# Patient Record
Sex: Male | Born: 1945 | Race: White | Hispanic: No | Smoking: Former smoker
Health system: Southern US, Community
[De-identification: ages and names within clinical notes are randomized; demographics above are authoritative.]

## PROBLEM LIST (undated history)

## (undated) DIAGNOSIS — M545 Low back pain, unspecified: Secondary | ICD-10-CM

## (undated) DIAGNOSIS — E663 Overweight: Secondary | ICD-10-CM

## (undated) DIAGNOSIS — R011 Cardiac murmur, unspecified: Secondary | ICD-10-CM

## (undated) DIAGNOSIS — J209 Acute bronchitis, unspecified: Secondary | ICD-10-CM

## (undated) DIAGNOSIS — I350 Nonrheumatic aortic (valve) stenosis: Secondary | ICD-10-CM

## (undated) DIAGNOSIS — R7989 Other specified abnormal findings of blood chemistry: Secondary | ICD-10-CM

## (undated) DIAGNOSIS — I6529 Occlusion and stenosis of unspecified carotid artery: Secondary | ICD-10-CM

## (undated) DIAGNOSIS — I10 Essential (primary) hypertension: Secondary | ICD-10-CM

## (undated) DIAGNOSIS — Z87440 Personal history of urinary (tract) infections: Secondary | ICD-10-CM

## (undated) DIAGNOSIS — R918 Other nonspecific abnormal finding of lung field: Secondary | ICD-10-CM

## (undated) DIAGNOSIS — H269 Unspecified cataract: Secondary | ICD-10-CM

## (undated) DIAGNOSIS — Z952 Presence of prosthetic heart valve: Secondary | ICD-10-CM

## (undated) DIAGNOSIS — R945 Abnormal results of liver function studies: Secondary | ICD-10-CM

## (undated) DIAGNOSIS — K219 Gastro-esophageal reflux disease without esophagitis: Secondary | ICD-10-CM

## (undated) DIAGNOSIS — I2510 Atherosclerotic heart disease of native coronary artery without angina pectoris: Secondary | ICD-10-CM

## (undated) DIAGNOSIS — E7800 Pure hypercholesterolemia, unspecified: Secondary | ICD-10-CM

## (undated) DIAGNOSIS — R0600 Dyspnea, unspecified: Secondary | ICD-10-CM

## (undated) DIAGNOSIS — M1990 Unspecified osteoarthritis, unspecified site: Secondary | ICD-10-CM

## (undated) HISTORY — DX: Occlusion and stenosis of unspecified carotid artery: I65.29

## (undated) HISTORY — DX: Personal history of urinary (tract) infections: Z87.440

## (undated) HISTORY — DX: Unspecified osteoarthritis, unspecified site: M19.90

## (undated) HISTORY — DX: Overweight: E66.3

## (undated) HISTORY — DX: Gastro-esophageal reflux disease without esophagitis: K21.9

## (undated) HISTORY — DX: Essential (primary) hypertension: I10

## (undated) HISTORY — DX: Low back pain: M54.5

## (undated) HISTORY — DX: Pure hypercholesterolemia, unspecified: E78.00

## (undated) HISTORY — DX: Abnormal results of liver function studies: R94.5

## (undated) HISTORY — DX: Acute bronchitis, unspecified: J20.9

## (undated) HISTORY — DX: Low back pain, unspecified: M54.50

## (undated) HISTORY — DX: Other specified abnormal findings of blood chemistry: R79.89

## (undated) HISTORY — PX: COLONOSCOPY: SHX174

## (undated) HISTORY — DX: Unspecified cataract: H26.9

## (undated) HISTORY — PX: APPENDECTOMY: SHX54

## (undated) HISTORY — DX: Atherosclerotic heart disease of native coronary artery without angina pectoris: I25.10

---

## 2004-02-21 ENCOUNTER — Ambulatory Visit: Payer: Self-pay | Admitting: Pulmonary Disease

## 2004-04-08 ENCOUNTER — Ambulatory Visit: Payer: Self-pay | Admitting: Pulmonary Disease

## 2004-11-20 ENCOUNTER — Ambulatory Visit: Payer: Self-pay | Admitting: Pulmonary Disease

## 2004-12-04 ENCOUNTER — Ambulatory Visit: Payer: Self-pay | Admitting: Pulmonary Disease

## 2005-07-06 ENCOUNTER — Ambulatory Visit: Payer: Self-pay | Admitting: Pulmonary Disease

## 2005-07-06 ENCOUNTER — Inpatient Hospital Stay (HOSPITAL_COMMUNITY): Admission: EM | Admit: 2005-07-06 | Discharge: 2005-07-09 | Payer: Self-pay | Admitting: Emergency Medicine

## 2005-07-22 ENCOUNTER — Ambulatory Visit: Payer: Self-pay | Admitting: Pulmonary Disease

## 2005-08-21 ENCOUNTER — Ambulatory Visit: Payer: Self-pay | Admitting: Pulmonary Disease

## 2007-11-26 ENCOUNTER — Ambulatory Visit: Payer: Self-pay | Admitting: Pulmonary Disease

## 2007-11-26 DIAGNOSIS — Z8639 Personal history of other endocrine, nutritional and metabolic disease: Secondary | ICD-10-CM

## 2007-11-26 DIAGNOSIS — E785 Hyperlipidemia, unspecified: Secondary | ICD-10-CM

## 2007-11-26 DIAGNOSIS — K219 Gastro-esophageal reflux disease without esophagitis: Secondary | ICD-10-CM | POA: Insufficient documentation

## 2007-11-26 DIAGNOSIS — Z862 Personal history of diseases of the blood and blood-forming organs and certain disorders involving the immune mechanism: Secondary | ICD-10-CM

## 2007-11-26 DIAGNOSIS — M1990 Unspecified osteoarthritis, unspecified site: Secondary | ICD-10-CM | POA: Insufficient documentation

## 2007-12-03 ENCOUNTER — Ambulatory Visit: Payer: Self-pay | Admitting: Pulmonary Disease

## 2007-12-03 LAB — CONVERTED CEMR LAB
Basophils Absolute: 0.1 10*3/uL (ref 0.0–0.1)
Basophils Relative: 1.4 % (ref 0.0–3.0)
CO2: 30 meq/L (ref 19–32)
Cholesterol: 161 mg/dL (ref 0–200)
Eosinophils Relative: 3.3 % (ref 0.0–5.0)
GFR calc Af Amer: 97 mL/min
GFR calc non Af Amer: 80 mL/min
HCT: 41.7 % (ref 39.0–52.0)
HDL: 39.7 mg/dL (ref >39.0)
Lymphocytes Relative: 29.2 % (ref 12.0–46.0)
MCV: 87.1 fL (ref 78.0–100.0)
Monocytes Relative: 6.9 % (ref 3.0–12.0)
PSA: 1.37 ng/mL (ref 0.10–4.00)
RBC: 4.79 M/uL (ref 4.22–5.81)
RDW: 12.3 % (ref 11.5–14.6)
Total CHOL/HDL Ratio: 4.1
Triglycerides: 77 mg/dL (ref 0–149)
VLDL: 15 mg/dL (ref 0–40)

## 2008-02-23 ENCOUNTER — Ambulatory Visit: Payer: Self-pay | Admitting: Pulmonary Disease

## 2008-02-23 DIAGNOSIS — R011 Cardiac murmur, unspecified: Secondary | ICD-10-CM

## 2008-02-23 DIAGNOSIS — R079 Chest pain, unspecified: Secondary | ICD-10-CM

## 2008-02-23 DIAGNOSIS — M545 Low back pain: Secondary | ICD-10-CM

## 2008-02-27 DIAGNOSIS — E663 Overweight: Secondary | ICD-10-CM | POA: Insufficient documentation

## 2008-02-27 DIAGNOSIS — N390 Urinary tract infection, site not specified: Secondary | ICD-10-CM

## 2008-02-28 ENCOUNTER — Encounter: Payer: Self-pay | Admitting: Pulmonary Disease

## 2008-02-28 ENCOUNTER — Ambulatory Visit: Payer: Self-pay

## 2008-07-05 ENCOUNTER — Telehealth: Payer: Self-pay | Admitting: Pulmonary Disease

## 2008-08-03 ENCOUNTER — Ambulatory Visit: Payer: Self-pay | Admitting: Pulmonary Disease

## 2008-11-01 ENCOUNTER — Ambulatory Visit: Payer: Self-pay | Admitting: Pulmonary Disease

## 2008-11-13 ENCOUNTER — Telehealth: Payer: Self-pay | Admitting: Pulmonary Disease

## 2009-01-31 ENCOUNTER — Ambulatory Visit: Payer: Self-pay | Admitting: Pulmonary Disease

## 2009-01-31 DIAGNOSIS — J310 Chronic rhinitis: Secondary | ICD-10-CM | POA: Insufficient documentation

## 2009-01-31 DIAGNOSIS — J209 Acute bronchitis, unspecified: Secondary | ICD-10-CM

## 2009-02-08 ENCOUNTER — Ambulatory Visit: Payer: Self-pay | Admitting: Pulmonary Disease

## 2009-02-09 LAB — CONVERTED CEMR LAB
AST: 23 units/L (ref 0–37)
BUN: 18 mg/dL (ref 6–23)
Basophils Absolute: 0.1 10*3/uL (ref 0.0–0.1)
Basophils Relative: 1 % (ref 0.0–3.0)
CO2: 29 meq/L (ref 19–32)
Calcium: 9.1 mg/dL (ref 8.4–10.5)
Cholesterol: 133 mg/dL (ref 0–200)
Creatinine, Ser: 1.1 mg/dL (ref 0.4–1.5)
GFR calc non Af Amer: 71.77 mL/min (ref >60)
Hemoglobin: 13.8 g/dL (ref 13.0–17.0)
LDL Cholesterol: 74 mg/dL (ref 0–99)
Lymphocytes Relative: 25.3 % (ref 12.0–46.0)
Lymphs Abs: 1.7 10*3/uL (ref 0.7–4.0)
MCV: 89.6 fL (ref 78.0–100.0)
Monocytes Absolute: 0.4 10*3/uL (ref 0.1–1.0)
Monocytes Relative: 6 % (ref 3.0–12.0)
PSA: 2.09 ng/mL (ref 0.10–4.00)
Platelets: 245 10*3/uL (ref 150.0–400.0)
Sodium: 142 meq/L (ref 135–145)
Total Bilirubin: 0.9 mg/dL (ref 0.3–1.2)
Total Protein: 6.9 g/dL (ref 6.0–8.3)
Triglycerides: 101 mg/dL (ref 0.0–149.0)
WBC: 6.9 10*3/uL (ref 4.5–10.5)

## 2010-02-07 NOTE — Assessment & Plan Note (Signed)
Summary: refil of meds   History of Present Illness: This OV was opened just to refill meds at his request...  SN    Allergies: No Known Drug Allergies   Complete Medication List: 1)  Adult Aspirin Ec Low Strength 81 Mg Tbec (Aspirin) .Marland Kitchen.. 1 tab by mouth once daily.Marland KitchenMarland Kitchen 2)  Lipitor 20 Mg Tabs (Atorvastatin calcium) .... Take 1 tablet by mouth once a day 3)  Nexium 40 Mg Cpdr (Esomeprazole magnesium) .... Take 1 tablet by mouth once a day 4)  Ciprofloxacin Hcl 500 Mg Tabs (Ciprofloxacin hcl) .... Take one tablet by mouth two times a day until gone Prescriptions: LIPITOR 20 MG TABS (ATORVASTATIN CALCIUM) Take 1 tablet by mouth once a day  #30 x prn   Entered and Authorized by:   Michele Mcalpine MD   Signed by:   Michele Mcalpine MD on 01/31/2009   Method used:   Print then Give to Patient   RxID:   [id number] NEXIUM 40 MG CPDR (ESOMEPRAZOLE MAGNESIUM) Take 1 tablet by mouth once a day  #30 x prn   Entered and Authorized by:   Michele Mcalpine MD   Signed by:   Michele Mcalpine MD on 01/31/2009   Method used:   Print then Give to Patient   RxID:   [id number]

## 2010-02-07 NOTE — Assessment & Plan Note (Signed)
Summary: 2-3 month follow up/la   CC:  6 month ROV & review of mult medical problems....  History of Present Illness: 65 y/o WM here for a follow up visit...   ~  Feb10:  I last saw him Jul07 for a post-hosp visit (HOSP 7/1-4/07 for urosepsis)... today he is c/o 1 week hx of cough, dark gray phlegm, head & chest congestion, & right sided chest discomfort... he denies f/c/s, HA, hemoptysis, etc... CXR= clear, WNL;  EKG= NSR, WNL;  Labs= OK;  Rx= Augmentin & resolved.   ~  August 03, 2008:  Generally stable- feeling well w/o new complaints or concerns... we reviewed his meds and problem list... continue same.   ~  January 31, 2009:  36mo ROV doing well- needs meds refilled for 2011... he's had additional prob w/ dust & nasal symptoms at work- we discussed Rx w/ Saline + Flonase Qhs...  due for Fasting labs and overdue for f/u colon (we will refer to GI)...    Current Problems:   CHRONIC RHINITIS (ICD-472.0) - he is exposed to dust etc at work... we discussed Rx w/ saline & FLONASE Qhs...  ACUTE BRONCHITIS (ICD-466.0) - he is an ex-smoker, quit 1992... he also has hx of prev LLL pneumonia...  ~  CXR 2/10 was clear & WNL...  CHEST PAIN (ICD-786.50) - hx atypical CP in past...  ~  EKG 2/10 showed NSR, NAD...  CARDIAC MURMUR (ICD-785.2) - sys murmur at base...  ~  2DEcho 2/10 showed norm LV size & function w/ EF= 60%; no regional wall motion abn; incr AoV thickness.  HYPERCHOLESTEROLEMIA (ICD-272.0) - on LIPITOR 20mg /d + low chol/ low fat diet...  ~  before meds:  Chol ~270, TG ~500...  ~  FLP 7/07 in hosp showed TChol 105, TG 181, HDL 35, LDL 34...  ~  FLP 11/09 showed TChol 161, TG 77, HDL 40, LDL 106...  ~  FLP 1/11 showed =   OVERWEIGHT (ICD-278.02) - he has been counselled on diet + exercise program...   ~  weight 7/07 post-hosp = 207#.Marland Kitchen.  ~  weight 2/10 up 13#  to 220#...  GERD (ICD-530.81) - on NEXIUM 40mg /d, which he says he really needs due to GERD symptoms...  ~  EGD 9/04 by  DrGessner showed prolapsing HH, no esophagitis or Barrett's seen...  Family Hx of ADENOCARCINOMA, COLON (ICD-153.9) - his father died at age 24 w/ colon cancer...   ~  last colonoscopy 10/04 by DrGessnewr showed hem's only... f/u planned 51yrs and due now...  ~  1/11:  we referred him back to GI for the f/u colonoscopy...  LIVER FUNCTION TESTS, ABNORMAL, HX OF (ICD-V12.2) - likely related to hepatic steatosis... followed by Rodena Medin, but I can't locate prev imaging studies...  ~  LFT's 1/11 showed SGOT=   Hx of UTI (ICD-599.0) - HOSP Jul07 w/ urosepsis from sens EColi- Rx's w/ Cipro... PSA was elevated and ret to normal after infection resolved...   ~  labs 11/09 showed PSA= 1.39...  ~  labs 1/11 showed PSA=   DEGENERATIVE JOINT DISEASE (ICD-715.90) - he has DJD symptoms in hands & states "I have weak ankles"...  BACK PAIN, LUMBAR (ICD-724.2)    Allergies (verified): No Known Drug Allergies  Comments:  Nurse/Medical Assistant: The patient's medications and allergies were reviewed with the patient and were updated in the Medication and Allergy Lists.  Past History:  Past Medical History:  CHRONIC RHINITIS (ICD-472.0) Hx of ACUTE BRONCHITIS (ICD-466.0) CHEST PAIN (ICD-786.50)  CARDIAC MURMUR (ICD-785.2) HYPERCHOLESTEROLEMIA (ICD-272.0) OVERWEIGHT (ICD-278.02) GERD (ICD-530.81) Family Hx of ADENOCARCINOMA, COLON (ICD-153.9) LIVER FUNCTION TESTS, ABNORMAL, HX OF (ICD-V12.2) Hx of UTI (ICD-599.0) DEGENERATIVE JOINT DISEASE (ICD-715.90) BACK PAIN, LUMBAR (ICD-724.2)  Family History: Reviewed history from 02/23/2008 and no changes required. Father died age 74 from metastatic colon cancer, hx DJD. Mother alive age 16 w/ "swollen ankles" 2 Siblings- 1 Bro w/ prostate cancer 1 Sis alive & well  Social History: Reviewed history from 02/23/2008 and no changes required. quit smoking in 1991--smoked for about 15 years--1 PPD married- wife [name], 37 yrs. 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    Vital Signs:  Patient profile:   65 year old male Height:      71 inches Weight:      220 pounds O2 Sat:      99 % on Room air Temp:     97.3 degrees F oral Pulse rate:   71 / minute BP sitting:   116 / 78  (right arm) Cuff size:   regular  Vitals Entered By: Randell Loop CMA (January 31, 2009 3:28 PM)  O2 Sat at Rest %:  99 O2 Flow:  Room air CC: 6 month ROV & review of mult medical problems... Is Patient Diabetic? No Pain Assessment Patient in pain? no      Comments meds updated today   Physical Exam  Additional Exam:  WD, WN, 65 y/o WM in NAD... GENERAL:  Alert & oriented; pleasant & cooperative. HEENT:  [place]/AT, EOM-wnl, PERRLA, EACs-clear, TMs-wnl, NOSE-clear, THROAT-clear & wnl. NECK:  Supple w/ fairROM; no JVD; normal carotid impulses w/o bruits; no thyromegaly or nodules palpated; no lymphadenopathy. CHEST:  Clear to P & A; without wheezes/ rales/ or rhonchi. HEART:  Regular Rhythm; gr 1-2/6 SEM base w/o rubs or gallops... ABDOMEN:  Soft & nontender; normal bowel sounds; no organomegaly or masses detected. EXT: without deformities, mild arthritic changes; no varicose veins/ venous insuffic/ or edema. NEURO:  CN's intact; motor testing normal; sensory testing normal; gait normal & balance OK. DERM:  No lesions noted; no rash etc...     MISC. Report  Procedure date:  01/31/2009  Findings:      He will return next week for FASTING blood work, & we  will call him w/ the results and any new recommendations...  SN   Impression & Recommendations:  Problem # 1:  CHRONIC RHINITIS (ICD-472.0) We discussed Rx w/ Saline & Flonase Qhs...  Problem # 2:  CARDIAC MURMUR (ICD-785.2) Stable-  no change in murmur, no CP/ palpit/ SOB/ etc...  Problem # 3:  HYPERCHOLESTEROLEMIA (ICD-272.0) Due for f/u FLP & he will ret next week... discussed diet + exercise & meds... His updated medication list for this problem includes:    Lipitor 20 Mg Tabs (Atorvastatin calcium) .Marland Kitchen... Take 1 tablet by mouth once a day  Problem # 4:  OVERWEIGHT (ICD-278.02) Wt reduction is key...  Problem # 5:  Family Hx of ADENOCARCINOMA, COLON (ICD-153.9) Overdue for colonoscopy f/u w/ family hx colon ca...  Problem # 6:  DEGENERATIVE JOINT DISEASE (ICD-715.90) Stable-  uses OTC meds  Prn... His updated medication list for this problem includes:    Adult Aspirin Ec Low Strength 81 Mg Tbec (Aspirin) .Marland Kitchen... 1 tab by mouth once daily...  Problem # 7:  OTHER MEDICAL PROBLEMS AS NOTED>>>  Complete Medication List: 1)  Fluticasone Propionate 50 Mcg/act Susp (Fluticasone propionate) .... 2 sprays in each nostril at bedtime.Marland KitchenMarland Kitchen 2)  Adult Aspirin Ec Low Strength 81 Mg Tbec (Aspirin) .Marland Kitchen.. 1 tab by mouth once daily.Marland KitchenMarland Kitchen 3)  Lipitor 20 Mg Tabs (Atorvastatin calcium) .... Take 1 tablet by mouth once a day 4)  Nexium 40 Mg Cpdr (Esomeprazole magnesium) .... Take 1 tablet by mouth once a day  Other Orders: Prescription Created Electronically [phone])  Patient Instructions: 1)  Today we updated your med list- see below.... 2)  We refilled your meds for 2011.Marland KitchenMarland KitchenWe wrote a new perscription for a nasal inhaler to spray 2 sprays in each nostril at bedtime.Marland KitchenMarland Kitchen  3)  Don't forget to use the Nasal Saline Mist every 1-2 H while awake to wash out the dust etc... 4)  Please return to our lab one morning next week for your FASTING blood work... please call the "phone tree" in a few days for your lab  results.Marland KitchenMarland Kitchen 5)  You are overdue for your follow up COLONOSCOPY w/ drGessner- we will set up a referral for you to sched this at [facility]... 6)  Call for any problems.Marland KitchenMarland Kitchen 7)  Please schedule a follow-up appointment in 6 months. Prescriptions: FLUTICASONE PROPIONATE 50 MCG/ACT SUSP (FLUTICASONE PROPIONATE) 2 sprays in each nostril at bedtime...  #1 x prn   Entered and Authorized by:   Michele Mcalpine MD   Signed by:   Michele Mcalpine MD on 01/31/2009   Method used:   Print then Give to Patient   RxID:   [id number]

## 2010-05-14 ENCOUNTER — Encounter: Payer: Self-pay | Admitting: Pulmonary Disease

## 2010-05-14 ENCOUNTER — Ambulatory Visit (INDEPENDENT_AMBULATORY_CARE_PROVIDER_SITE_OTHER): Payer: BC Managed Care – PPO | Admitting: Pulmonary Disease

## 2010-05-14 DIAGNOSIS — E7800 Pure hypercholesterolemia, unspecified: Secondary | ICD-10-CM

## 2010-05-14 DIAGNOSIS — R011 Cardiac murmur, unspecified: Secondary | ICD-10-CM

## 2010-05-14 DIAGNOSIS — R5381 Other malaise: Secondary | ICD-10-CM

## 2010-05-14 DIAGNOSIS — Z125 Encounter for screening for malignant neoplasm of prostate: Secondary | ICD-10-CM

## 2010-05-14 DIAGNOSIS — M545 Low back pain: Secondary | ICD-10-CM

## 2010-05-14 DIAGNOSIS — R531 Weakness: Secondary | ICD-10-CM | POA: Insufficient documentation

## 2010-05-14 DIAGNOSIS — K219 Gastro-esophageal reflux disease without esophagitis: Secondary | ICD-10-CM

## 2010-05-14 DIAGNOSIS — E663 Overweight: Secondary | ICD-10-CM

## 2010-05-14 DIAGNOSIS — M1990 Unspecified osteoarthritis, unspecified site: Secondary | ICD-10-CM

## 2010-05-14 MED ORDER — ESOMEPRAZOLE MAGNESIUM 40 MG PO CPDR: 40.0000 mg | DELAYED_RELEASE_CAPSULE | Freq: Every day | ORAL | Status: DC

## 2010-05-14 NOTE — Patient Instructions (Signed)
Today we updated your med list in EPIC...    Continue the same meds for now...  Let's plan to check your FASTING blood work ASAP...    After you have had your blood drawn> stop the Lipitor for 1 month & keep tabs on your symptoms...    Call me after a month off the Lipitor & let me know how you are doing>    We can then decide whether to stay off the med and follow up the lipid panel on diet alone later, or restart the med and try something else for the discomfort...     Please call the PHONE TREE a few days after your labs have been drawn for your results...    Dial [id number] & when prompted enter your patient number followed by the # symbol...    Your patient number is:  [id number]  Let's get on track w/ our diet & exercise program... Call for any questions.Marland KitchenMarland Kitchen

## 2010-05-20 ENCOUNTER — Other Ambulatory Visit (INDEPENDENT_AMBULATORY_CARE_PROVIDER_SITE_OTHER): Payer: BC Managed Care – PPO

## 2010-05-20 DIAGNOSIS — K219 Gastro-esophageal reflux disease without esophagitis: Secondary | ICD-10-CM

## 2010-05-20 DIAGNOSIS — E663 Overweight: Secondary | ICD-10-CM

## 2010-05-20 DIAGNOSIS — R531 Weakness: Secondary | ICD-10-CM

## 2010-05-20 DIAGNOSIS — R011 Cardiac murmur, unspecified: Secondary | ICD-10-CM

## 2010-05-20 DIAGNOSIS — E7800 Pure hypercholesterolemia, unspecified: Secondary | ICD-10-CM

## 2010-05-20 DIAGNOSIS — R5381 Other malaise: Secondary | ICD-10-CM

## 2010-05-20 DIAGNOSIS — Z125 Encounter for screening for malignant neoplasm of prostate: Secondary | ICD-10-CM

## 2010-05-20 LAB — CBC WITH DIFFERENTIAL/PLATELET
Basophils Absolute: 0 10*3/uL (ref 0.0–0.1)
Hemoglobin: 14.9 g/dL (ref 13.0–17.0)
Lymphocytes Relative: 29 % (ref 12.0–46.0)
MCHC: 34.9 g/dL (ref 30.0–36.0)
Monocytes Absolute: 0.5 10*3/uL (ref 0.1–1.0)
Neutro Abs: 4.1 10*3/uL (ref 1.4–7.7)
RBC: 4.86 Mil/uL (ref 4.22–5.81)
RDW: 13.9 % (ref 11.5–14.6)

## 2010-05-20 LAB — HEPATIC FUNCTION PANEL
ALT: 37 U/L (ref 0–53)
Alkaline Phosphatase: 57 U/L (ref 39–117)
Bilirubin, Direct: 0.1 mg/dL (ref 0.0–0.3)
Total Bilirubin: 0.8 mg/dL (ref 0.3–1.2)
Total Protein: 6.6 g/dL (ref 6.0–8.3)

## 2010-05-20 LAB — LIPID PANEL: Triglycerides: 65 mg/dL (ref 0.0–149.0)

## 2010-05-20 LAB — BASIC METABOLIC PANEL
Calcium: 9.1 mg/dL (ref 8.4–10.5)
Chloride: 101 mEq/L (ref 96–112)
Creatinine, Ser: 1.1 mg/dL (ref 0.4–1.5)
Sodium: 136 mEq/L (ref 135–145)

## 2010-05-20 LAB — PSA: PSA: 1.86 ng/mL (ref 0.10–4.00)

## 2010-05-24 NOTE — Discharge Summary (Signed)
Jerry Ferguson, BANFILL              ACCOUNT NO.:  [id number]   MEDICAL RECORD NO.:  [id number]          PATIENT TYPE:  INP   LOCATION:  5506                         FACILITY:  [facility]   PHYSICIAN:  Lonzo Cloud. Kriste Basque, M.D. [facility] : [facility] OF BIRTH:  1945-02-14   DATE OF ADMISSION:  07/06/2005  DATE OF DISCHARGE:  07/09/2005                                 DISCHARGE SUMMARY   FINAL DIAGNOSIS:  1.  Admitted 07/06/2005 with urinary tract infection and urosepsis.  Urine      and blood cultures both grew a sensitive E-coli.  He was treated with      one shot of gentamicin and then IV Cipro, switched to p.o. Cipro to      complete a course as outpatient.  2.  Urinary tract obstructive symptoms compatible with prostatic      hypertrophy; an elevated PSA at 28 this hospitalization with component      due to infection and this will be carefully followed up as an      outpatient; Flomax started and a urology consult as outpatient planned.  3.  Hypercholesterolemia - controlled on Lipitor.  4.  History of gastroesophageal reflux disease - controlled on Nexium.  5.  History of degenerative arthritis - treated with Lodine.  6.  History of previous left lower lobe pneumonia; the patient is an ex-      smoker having quit 15 years ago.   BRIEF HISTORY:  The patient is a 65 year old gentleman whom I have seen  intermittently for general medical purposes.  He has a history of low back  pain and degenerative arthritis, gastroesophageal reflux disease and  hyperlipidemia.  He noted a gradual history of decreasing urinary stream and  increasing frequency.  This abruptly became worse 24 hours prior to  admission with decreased urine output and dysuria.  He had onset of shaking  chills at 2 o'clock in the afternoon on the day of admission.  Came to the  emergency room and was evaluated by ER doctor and had temperature of 101.2  and low blood pressure.  We were called for admission for urosepsis.   PAST MEDICAL  HISTORY:  He has a history of previous pneumonia and is an ex-  smoker having quit 15 years ago.  He has a history hypercholesterolemia  controlled on Lipitor.  His last cholesterol was 156, HDL 30, LDL 111, and  triglyceride 77 in 2006.  He has a history of gastroesophageal reflux  disease on Nexium.  He had an endoscopy in 2004 that showed hiatus hernia  and a colonoscopy in 2004 was negative except for hemorrhoids.  He has a  history of degenerative arthritis and takes Lodine as needed.   PHYSICAL EXAMINATION:  GENERAL:  A 65 year old gentleman was somewhat  uncomfortable on a stretcher in the ER.  VITAL SIGNS:  His blood pressure was 96/64, pulse 92 and regular,  respirations 22 and not labored, O2 sat in the 90s on room air.  Temperature  was 101.2.  HEENT:  Unremarkable except for some dry mucous membranes.  NECK:  Supple.  No jugular distension  or carotid bruits, no thyromegaly or  lymphadenopathy.  LUNGS:  Clear to percussion, auscultation.  HEART:  Regular rate and rhythm, no murmurs, rubs or gallops.  ABDOMEN:  Soft and nontender without evidence organomegaly or masses. The  bladder was palpable above the pubis.  EXTREMITIES:  Warm with no cyanosis, clubbing or edema.  NEUROLOGY:  Intact without focal abnormalities detected.   LABORATORY DATA:  EKG showed normal sinus rhythm and was within normal  limits.   CHEST X-RAY:  Showed some peribronchial thickening compatible with mild  bronchitic change.   Hemoglobin 14.7, hematocrit 42.2, white count 5900, repeat was 17,400 with  93% segs.  Sodium 138, potassium 2.9, repeat 4.6, chloride 105, CO2 24, BUN  17, creatinine 1.7, repeat 1.3, blood sugar 113, calcium 8.4 total protein  6.1, albumin 3.4, AST 56, ALT 63, alkaline phosphatase 71, total bilirubin  1.3.  Lipid profile showed a cholesterol of 105, HDL 35, LDL 34,  triglyceride 181, PSA elevated at 28.8. Urinalysis showed large leukocyte  esterase, some bacteria and white  cells, two blood cultures were positive  for E-coli that was pansensitive as was the urine germ.   HOSPITAL COURSE:  The patient was admitted by Dr. Sherene Sires in my absence.  He  gave him a shot of gentamicin in the emergency room and started him on IV  Cipro.  The patient responded nicely to this regimen with decrease in his  fevers and improvement in his clinical status.  He remained afebrile  throughout the hospital course.  His white count improved and his urine  cleared.  A Foley placed in the ER, this was discontinued and Flomax was  started.  He had improvement in his urinary stream and was able to empty his  bladder fully.  When the cultures returned with pan sensitive E-coli, he was  switched to oral Cipro and felt to be MHB and ready for discharge on 07/04.   MEDICATIONS AT DISCHARGE:  1.  Cipro 250 mg p.o. b.i.d. for seven more days till gone.  2.  Flomax 0.4 mg p.o. daily.  3.  Nexium 40 mg p.o. daily.  4.  Lipitor 20 mg p.o. daily.  5.  Lodine 400 mg p.o. b.i.d. with food as needed for arthritis.  6.  Enteric-coated aspirin 1 mg daily.   DISPOSITION:  We plan follow-up with the patient in office in 2 weeks.  We  will recheck his labs including liver enzymes and his urine and blood count.  We will also recheck his PSA and we plan a urologic consultation as an  outpatient.      Lonzo Cloud. Kriste Basque, M.D. [facility]  Electronically Signed     SMN/MEDQ  D:  07/09/2005  T:  07/09/2005  Job:  [id number]

## 2010-05-24 NOTE — H&P (Signed)
NAMECHADRICK, SPRINKLE              ACCOUNT NO.:  [id number]   MEDICAL RECORD NO.:  [id number]          PATIENT TYPE:  EMS   LOCATION:  MAJO                         FACILITY:  [facility]   PHYSICIAN:  Casimiro Needle B. Sherene Sires, M.D. [facility] OF BIRTH:  1945-01-25   DATE OF ADMISSION:  07/06/2005  DATE OF DISCHARGE:                                HISTORY & PHYSICAL   CHIEF COMPLAINT:  Dysuria and weakness.   HISTORY OF PRESENT ILLNESS:  This is a 65 year old white male patient of Dr.  Jodelle Green with a history of chronic low back pain, degenerative arthritis,  gastroesophageal reflux disease, and hyperlipidemia, who presents with a  several year history of increasing urinary frequency with decreased force of  stream. This abruptly became worse over the last 24 hours associated with  dysuria and decreased urine output. He noted the onset of shaking chills  about 2:00 o'clock on the afternoon of admission and came to the emergency  room where it was noted that he had temperature of 101.2 initially with  adequate vital signs but then dropped his blood pressure after the initial  set of vital signs were obtained and I was asked to see him for possible  urinary tract sepsis by Dr. Lynelle Doctor. Presently, the patient states he feels  somewhat better on IV fluids. He has mild headache and some back pain but  nothing unusual for him. He denies any sore throat, sinus complaints,  cough, chest pain, abdominal pain, increased arthritis symptoms overall, leg  swelling, rash, or unusual exposure history.   PAST MEDICAL HISTORY:  Significant for the problems as outlined above.   ALLERGIES:  NO KNOWN DRUG ALLERGIES.   MEDICATIONS:  Include only Etodolac 400 mg once daily, Lipitor 20 mg once  daily, and Nexium 40 mg at bedtime.   SOCIAL HISTORY:  He quit smoking in 1993. He says he drank a lot but quit  in the 60's. Denies any unusual travel pattern, pet, or hobby exposure. His  wife, presently, is a patient with  pneumonia, in the hospital.   FAMILY HISTORY:  Positive for colon cancer in his father. Mother has  swelling and I am not sure why. No prostate cancer or diabetes in the  family to his knowledge.   REVIEW OF SYSTEMS:  Essentially negative except as outlined above.   PHYSICAL EXAMINATION:  GENERAL:  This is an uncomfortable but certainly, not  toxic appearing stretcher bound white male in no acute distress. He is  sitting up at about 30 degrees, alert and appropriate.  VITAL SIGNS:  His last blood pressure was 96/65 with a pulse rate of 92,  respiratory rate in the 20's. Saturations are adequate on room air.  HEENT:  Oropharynx is clear. Dentition intact.  NECK:  Supple without cervical adenopathy or tenderness. Trachea is midline.  LUNGS:  Fields are clear bilaterally. Clear to percussion.  HEART:  Regular rhythm without murmur, rub, or gallop.  ABDOMEN:  Soft and benign with questionable palpable bladder, 3 or 4  fingerbreadth's up from the anterior synthesis, pubic prominence anteriorly.  EXTREMITIES:  Warm without calf tenderness, cyanosis,  clubbing, edema, or  rash.  NEUROLOGIC:  No focal deficits.Marland Kitchen  SKIN:  Warm and dry.   LABORATORY DATA:  White count was 5,900. Hematocrit was 42%. Sodium 138,  potassium 2.9, BUN 17. Urinalysis revealed 21 to 50 white blood cells.   IMPRESSION:  1.  Classic benign prostatic hypertrophy symptoms in retrospect, now with      acute urinary retention, probably secondary to prostatitis with pyuria      and rigors, consistent with a urinary tract infection, secondary to      bladder outlet obstructions.   RECOMMENDATIONS:  Placement of Foley. Gentamicin 80 mg IV and continuation  of Cipro 400 mg IV q.12 with vigorous IV fluids for the first 24 hours.   IMPRESSION:  1.  Hypokalemia, unclear etiology, since he is not on diuretics.   RECOMMENDATIONS:  Supplement orally, as he has had no nausea or vomiting.   ASSESSMENT:  I do not believe this  patient needs to be in the ICU, if he  responds to fluids and ciprofloxacin as well as Foley placement in the ER,  based on the way he looks presently, but for lack of significant comorbid  factors.           ______________________________  Charlaine Dalton Sherene Sires, M.D. [facility]     MBW/MEDQ  D:  07/06/2005  T:  07/06/2005  Job:  [id number]

## 2010-06-03 ENCOUNTER — Encounter: Payer: Self-pay | Admitting: Pulmonary Disease

## 2010-06-03 NOTE — Progress Notes (Signed)
Subjective:    Patient ID: Jerry Ferguson, male    DOB: [birth year], 65 y.o.   MRN: [id number]  HPI 65 y/o WM here for a follow up visit...  ~  Feb10:  I last saw him Jul07 for a post-hosp visit (HOSP 7/1-4/07 for urosepsis)... today he is c/o 1 week hx of cough, dark gray phlegm, head & chest congestion, & right sided chest discomfort... he denies f/c/s, HA, hemoptysis, etc... CXR= clear, WNL;  EKG= NSR, WNL;  Labs= OK;  Rx= Augmentin & resolved.  ~  August 03, 2008:  Generally stable- feeling well w/o new complaints or concerns... we reviewed his meds and problem list... continue same.  ~  January 31, 2009:  36mo ROV doing well- needs meds refilled for 2011... he's had additional prob w/ dust & nasal symptoms at work- we discussed Rx w/ Saline + Flonase Qhs...  due for Fasting labs and overdue for f/u colon (we will refer to GI)...  ~  May 14, 2010:  37mo ROV- [name]'s wife, Jerry Ferguson, passed away 07/08/2022 w/ multisys dis (CAD, CHF, resp fail- made it out to rehab at [facility] & died in her sleep);  He has noted incr fatigue, tired all the time, LBP (worse sitting), bilat ankle pain/ paresthesias ("even the covers make them hurt"), & he notes all this seemed to occur when they switched him to generic Lipitor;  Breathing has been good- denies CP/ palpit/ dizzy/ syncope/ edema...  Needs refill prescriptions & ret for fasting labs> all WNL.Marland KitchenMarland Kitchen       Problem List:  CHRONIC RHINITIS (ICD-472.0) - he is exposed to dust etc at work... we discussed Rx w/ saline & FLONASE Qhs...  ACUTE BRONCHITIS (ICD-466.0) - he is an ex-smoker, quit 1992... he also has hx of prev LLL pneumonia... ~  CXR 2/10 was clear & WNL...  CHEST PAIN (ICD-786.50) - hx atypical CP in past... ~  EKG 2/10 showed NSR, NAD...  CARDIAC MURMUR (ICD-785.2) - sys murmur at base... ~  2DEcho 2/10 showed norm LV size & function w/ EF= 60%; no regional wall motion abn; incr AoV thickness.  HYPERCHOLESTEROLEMIA (ICD-272.0) - on LIPITOR 20mg /d + low chol/  low fat diet... ~  before meds:  Chol~270, TG~500 ~  FLP 7/07 in hosp showed TChol 105, TG 181, HDL 35, LDL 34 ~  FLP 11/09 showed TChol 161, TG 77, HDL 40, LDL 106 ~  FLP 1/11 on Lip20 showed  TChol 133, TG 101, HDL 39, LDL 74 ~  FLP 5/12 on Lip20 showed TChol 149, TG 65, HDL 41, LDL 95> presented w/ mult symptoms & going to try off the Atorva20 for one month.  OVERWEIGHT (ICD-278.02) - he has been counselled on diet + exercise program...  ~  weight 7/07 post-hosp = 207# ~  weight 2/10 up 13#  to 220# ~  Weight 5/12 =219#  GERD (ICD-530.81) - on NEXIUM 40mg /d, which he says he really needs due to GERD symptoms... ~  EGD 9/04 by DrGessner showed prolapsing HH, no esophagitis or Barrett's seen...  Family Hx of ADENOCARCINOMA, COLON (ICD-153.9) - his father died at age 85 w/ colon cancer...  ~  last colonoscopy 10/04 by DrGessnewr showed hem's only... f/u planned 13yrs and due now... ~  1/11:  we referred him back to GI for the f/u colonoscopy ==> he never set this up. ~  5/12:  Reminded to set up his f/u colonoscopy...  LIVER FUNCTION TESTS, ABNORMAL, HX OF (ICD-V12.2) - likely related  to hepatic steatosis... followed by Rodena Medin, but I can't locate prev imaging studies... ~  LFTs 1/11 were normal w/ SGOT= 23, SGPT= 30 ~  LFTs 5/12 remained WNL.Marland KitchenMarland Kitchen  Hx of UTI (ICD-599.0) - HOSP Jul07 w/ urosepsis from sens EColi- Rx's w/ Cipro... PSA was elevated and ret to normal after infection resolved...  ~  labs 11/09 showed PSA= 1.39 ~  labs 1/11 showed PSA= 2.09 ~  Labs 5/12 showed PSA= 1.86  DEGENERATIVE JOINT DISEASE (ICD-715.90) - he has DJD symptoms in hands & states "I have weak ankles"...  BACK PAIN, LUMBAR (ICD-724.2)   No past surgical history on file.  Outpatient Encounter Prescriptions as of 05/14/2010  Medication Sig Dispense Refill  . atorvastatin (LIPITOR) 20 MG tablet Take 20 mg by mouth daily.   ==> HOLD for one month.    . esomeprazole (NEXIUM) 40 MG capsule Take 1 capsule  (40 mg total) by mouth daily before breakfast.  30 capsule  11  . DISCONTD: esomeprazole (NEXIUM) 40 MG capsule Take 40 mg by mouth daily before breakfast.        . aspirin 81 MG tablet Take 81 mg by mouth daily.        . fluticasone (FLONASE) 50 MCG/ACT nasal spray 2 sprays by Nasal route at bedtime.          No Known Allergies   Review of Systems        See HPI - all other systems neg except as noted... The patient denies anorexia, fever, weight loss, weight gain, vision loss, decreased hearing, hoarseness, chest pain, syncope, dyspnea on exertion, peripheral edema, prolonged cough, headaches, hemoptysis, abdominal pain, melena, hematochezia, severe indigestion/heartburn, hematuria, incontinence, muscle weakness, suspicious skin lesions, transient blindness, difficulty walking, depression, unusual weight change, abnormal bleeding, enlarged lymph nodes, and angioedema.   Objective:   Physical Exam    WD, WN, 65 y/o WM in NAD... GENERAL:  Alert & oriented; pleasant & cooperative. HEENT:  [place]/AT, EOM-wnl, PERRLA, EACs-clear, TMs-wnl, NOSE-clear, THROAT-clear & wnl. NECK:  Supple w/ fairROM; no JVD; normal carotid impulses w/o bruits; no thyromegaly or nodules palpated; no lymphadenopathy. CHEST:  Clear to P & A; without wheezes/ rales/ or rhonchi. HEART:  Regular Rhythm; gr 1-2/6 SEM base w/o rubs or gallops... ABDOMEN:  Soft & nontender; normal bowel sounds; no organomegaly or masses detected. EXT: without deformities, mild arthritic changes; no varicose veins/ venous insuffic/ or edema. NEURO:  CN's intact; motor testing normal; sensory testing normal; gait normal & balance OK. DERM:  No lesions noted; no rash etc...   Assessment & Plan:   Constitutional symptoms & mult somatic complaints>  ?etiology but he feels it's the generic Lipitor by timing of the symptom onset;  rec STOP Atovastatin for 1 month a re-assess...  CHOL>  FLP looks good on the Lip20 + diet etc...  GERD>  Stable  on the Nexium   Due for f/u colonoscopy>  +fam hx colon cancer...  Other medical problems as noted.Marland KitchenMarland Kitchen

## 2010-06-26 ENCOUNTER — Telehealth: Payer: Self-pay | Admitting: Pulmonary Disease

## 2010-06-26 MED ORDER — ATORVASTATIN CALCIUM 20 MG PO TABS: 20.0000 mg | ORAL_TABLET | Freq: Every day | ORAL | Status: DC

## 2010-06-26 NOTE — Telephone Encounter (Signed)
Called and spoke with pt--he stated that he did not see much change.  He was unsure if SN wanted  Him to start back on the lipitor or try something else-which pt would rather just go back on the lipitor if he needs to-- or try just diet alone. Pt stated that his ankles still hurt but not as bad and pt really thinks it was the work that he was doing.  Pt is requesting recs from SN.  SN please advise. thanks

## 2010-06-26 NOTE — Telephone Encounter (Signed)
Per SN--he agrees with the pt and recs to restart on the lipitor and we will send in an rx for him to restart on this med.  Called and spoke with pt and he is aware that this med has been sent to the pharmacy for him.

## 2011-06-04 ENCOUNTER — Other Ambulatory Visit: Payer: Self-pay | Admitting: Pulmonary Disease

## 2011-07-08 ENCOUNTER — Telehealth: Payer: Self-pay | Admitting: Pulmonary Disease

## 2011-07-08 ENCOUNTER — Other Ambulatory Visit: Payer: Self-pay | Admitting: Pulmonary Disease

## 2011-07-08 MED ORDER — ATORVASTATIN CALCIUM 20 MG PO TABS: 20.0000 mg | ORAL_TABLET | Freq: Every day | ORAL | Status: DC

## 2011-07-08 MED ORDER — ESOMEPRAZOLE MAGNESIUM 40 MG PO CPDR: DELAYED_RELEASE_CAPSULE | ORAL | Status: DC

## 2011-07-08 NOTE — Telephone Encounter (Signed)
I spoke with pt and is aware rx's have been sent--also aware needs to keep OV for further refills.

## 2011-08-20 ENCOUNTER — Ambulatory Visit (INDEPENDENT_AMBULATORY_CARE_PROVIDER_SITE_OTHER): Payer: BC Managed Care – PPO | Admitting: Pulmonary Disease

## 2011-08-20 ENCOUNTER — Encounter: Payer: Self-pay | Admitting: Pulmonary Disease

## 2011-08-20 VITALS — BP 144/82 | HR 65 | Temp 97.0°F | Ht 71.0 in | Wt 222.8 lb

## 2011-08-20 DIAGNOSIS — J310 Chronic rhinitis: Secondary | ICD-10-CM

## 2011-08-20 DIAGNOSIS — R011 Cardiac murmur, unspecified: Secondary | ICD-10-CM

## 2011-08-20 DIAGNOSIS — N320 Bladder-neck obstruction: Secondary | ICD-10-CM

## 2011-08-20 DIAGNOSIS — K219 Gastro-esophageal reflux disease without esophagitis: Secondary | ICD-10-CM

## 2011-08-20 DIAGNOSIS — E663 Overweight: Secondary | ICD-10-CM

## 2011-08-20 DIAGNOSIS — M545 Low back pain: Secondary | ICD-10-CM

## 2011-08-20 DIAGNOSIS — M1990 Unspecified osteoarthritis, unspecified site: Secondary | ICD-10-CM

## 2011-08-20 DIAGNOSIS — E7800 Pure hypercholesterolemia, unspecified: Secondary | ICD-10-CM

## 2011-08-20 DIAGNOSIS — N139 Obstructive and reflux uropathy, unspecified: Secondary | ICD-10-CM

## 2011-08-20 MED ORDER — ESOMEPRAZOLE MAGNESIUM 40 MG PO CPDR: DELAYED_RELEASE_CAPSULE | ORAL | Status: DC

## 2011-08-20 MED ORDER — ATORVASTATIN CALCIUM 20 MG PO TABS: 20.0000 mg | ORAL_TABLET | Freq: Every day | ORAL | Status: DC

## 2011-08-20 NOTE — Progress Notes (Addendum)
Subjective:    Patient ID: Jerry Ferguson, male    DOB: 06-24-1945, 66 y.o.   MRN: [id number]  HPI 66 y/o WM here for a follow up visit...  ~  Feb10:  I last saw him Jul07 for a post-hosp visit (HOSP 7/1-4/07 for urosepsis)... today he is c/o 1 week hx of cough, dark gray phlegm, head & chest congestion, & right sided chest discomfort... he denies f/c/s, HA, hemoptysis, etc... CXR= clear, WNL;  EKG= NSR, WNL;  Labs= OK;  Rx= Augmentin & resolved.  ~  August 03, 2008:  Generally stable- feeling well w/o new complaints or concerns... we reviewed his meds and problem list... continue same.  ~  January 31, 2009:  66mo ROV doing well- needs meds refilled for 2011... he's had additional prob w/ dust & nasal symptoms at work- we discussed Rx w/ Saline + Flonase Qhs...  due for Fasting labs and overdue for f/u colon (we will refer to GI)...  ~  May 14, 2010:  66mo ROV- Myreon's wife, [name], passed away 07-05-22 w/ multisys dis (CAD, CHF, resp fail- made it out to rehab at [facility] & died in her sleep);  He has noted incr fatigue, tired all the time, LBP (worse sitting), bilat ankle pain/ paresthesias ("even the covers make them hurt"), & he notes all this seemed to occur when they switched him to generic Lipitor;  Breathing has been good- denies CP/ palpit/ dizzy/ syncope/ edema...  Needs refill prescriptions & ret for fasting labs> all WNL.Marland Kitchen.  ~  August 20, 2011:  66mo ROV & Jerry Ferguson has been doing well- no new complaints or concerns... He notes some constipation & we discussed the use of Miralax & Senakot-S;  He remains on Lip20, Nexium40, & Metamucil;  He notes that heartburn returns after 2-3d off the PPI Rx..    We reviewed prob list, meds, xrays and labs> see below for details >> LABS 8/13:  FLP- at goals on Lip20;  Chems- ok w/ BS=108;  CBC- wnl;  TSH=1.91;  PSA=1.58        Problem List:  CHRONIC RHINITIS (ICD-472.0) - he is exposed to dust etc at work... we discussed Rx w/ saline & FLONASE Qhs...  ACUTE  BRONCHITIS (ICD-466.0) - he is an ex-smoker, quit 1992... he also has hx of prev LLL pneumonia... ~  CXR 2/10 was clear & WNL...  CHEST PAIN (ICD-786.50) - hx atypical CP in past; on ASA 81mg /d... ~  EKG 2/10 showed NSR, NAD...  CARDIAC MURMUR (ICD-785.2) - sys murmur at base... ~  2DEcho 2/10 showed norm LV size & function w/ EF= 60%; no regional wall motion abn; incr AoV thickness.  HYPERCHOLESTEROLEMIA (ICD-272.0) - on LIPITOR 20mg /d + low chol/ low fat diet... ~  before meds:  Chol~270, TG~500 ~  FLP 7/07 in hosp showed TChol 105, TG 181, HDL 35, LDL 34 ~  FLP 11/09 showed TChol 161, TG 77, HDL 40, LDL 106 ~  FLP 1/11 on Lip20 showed  TChol 133, TG 101, HDL 39, LDL 74 ~  FLP 5/12 on Lip20 showed TChol 149, TG 65, HDL 41, LDL 95> presented w/ mult symptoms & going to try off the Atorva20 for one month. ~  FLP 8/13 on Lip20 showed TChol 134, TG 98, HDL 39, LDL 76  OVERWEIGHT (ICD-278.02) - he has been counselled on diet + exercise program...  ~  weight 7/07 post-hosp = 207# ~  weight 2/10 up 13#  to 220# ~  Weight 5/12 =  219# ~  Weight 8/13 = 223#  GERD (ICD-530.81) - on NEXIUM 40mg /d, which he says he really needs due to GERD symptoms... ~  EGD 9/04 by DrGessner showed prolapsing HH, no esophagitis or Barrett's seen...  Family Hx of ADENOCARCINOMA, COLON (ICD-153.9) - his father died at age 87 w/ colon cancer...  ~  last colonoscopy 10/04 by DrGessnewr showed hem's only... f/u planned 55yrs and due now... ~  1/11:  we referred him back to GI for the f/u colonoscopy ==> he never set this up. ~  5/12 & 8/13:  Reminded to set up his f/u colonoscopy...  LIVER FUNCTION TESTS, ABNORMAL, HX OF (ICD-V12.2) - likely related to hepatic steatosis... followed by Rodena Medin, but I can't locate prev imaging studies... ~  LFTs 1/11 were normal w/ SGOT= 23, SGPT= 30 ~  LFTs 5/12 & 8/13 remained WNL.Marland KitchenMarland Kitchen  Hx of UTI (ICD-599.0) - HOSP Jul07 w/ urosepsis from sens EColi- Rx's w/ Cipro... PSA was  elevated and ret to normal after infection resolved...  ~  labs 11/09 showed PSA= 1.39 ~  labs 1/11 showed PSA= 2.09 ~  Labs 5/12 showed PSA= 1.86 ~  Labs 8/13 showed PSA= 1.58  DEGENERATIVE JOINT DISEASE (ICD-715.90) - he has DJD symptoms in hands & states "I have weak ankles"...  BACK PAIN, LUMBAR (ICD-724.2)   No past surgical history on file.   Outpatient Encounter Prescriptions as of 08/20/2011  Medication Sig Dispense Refill  . aspirin 81 MG tablet Take 81 mg by mouth daily.        Marland Kitchen atorvastatin (LIPITOR) 20 MG tablet Take 1 tablet (20 mg total) by mouth daily.  30 tablet  1  . esomeprazole (NEXIUM) 40 MG capsule Take 1 capsule by mouth daily before breakfast  30 capsule  1  . fluticasone (FLONASE) 50 MCG/ACT nasal spray Place 2 sprays into the nose as needed.         No Known Allergies   Current Medications, Allergies, Past Medical History, Past Surgical History, Family History, and Social History were reviewed in [employer] record.   Review of Systems        See HPI - all other systems neg except as noted... The patient denies anorexia, fever, weight loss, weight gain, vision loss, decreased hearing, hoarseness, chest pain, syncope, dyspnea on exertion, peripheral edema, prolonged cough, headaches, hemoptysis, abdominal pain, melena, hematochezia, severe indigestion/heartburn, hematuria, incontinence, muscle weakness, suspicious skin lesions, transient blindness, difficulty walking, depression, unusual weight change, abnormal bleeding, enlarged lymph nodes, and angioedema.   Objective:   Physical Exam    WD, WN, 66 y/o WM in NAD... GENERAL:  Alert & oriented; pleasant & cooperative. HEENT:  [place]/AT, EOM-wnl, PERRLA, EACs-clear, TMs-wnl, NOSE-clear, THROAT-clear & wnl. NECK:  Supple w/ fairROM; no JVD; normal carotid impulses w/o bruits; no thyromegaly or nodules palpated; no lymphadenopathy. CHEST:  Clear to P & A; without wheezes/ rales/ or  rhonchi. HEART:  Regular Rhythm; gr 1-2/6 SEM base w/o rubs or gallops... ABDOMEN:  Soft & nontender; normal bowel sounds; no organomegaly or masses detected. EXT: without deformities, mild arthritic changes; no varicose veins/ venous insuffic/ or edema. NEURO:  CN's intact; motor testing normal; sensory testing normal; gait normal & balance OK. DERM:  No lesions noted; no rash etc...  RADIOLOGY DATA:  Reviewed in the EPIC EMR & discussed w/ the patient...  LABORATORY DATA:  Reviewed in the EPIC EMR & discussed w/ the patient...   Assessment & Plan:   Rhinitis/ Bronchitis>  No recent resp track problems...  CHOL>  FLP looks good on the Lip20 + diet etc...  GERD>  Stable on the Nexium   Due for f/u colonoscopy>  +fam hx colon cancer...  DJD, Back pain> uses OTC meds as needed...  Other medical problems as noted...   Patient's Medications  New Prescriptions   No medications on file  Previous Medications   ASPIRIN 81 MG TABLET    Take 81 mg by mouth daily.     FLUTICASONE (FLONASE) 50 MCG/ACT NASAL SPRAY    Place 2 sprays into the nose as needed.   Modified Medications   Modified Medication Previous Medication   ATORVASTATIN (LIPITOR) 20 MG TABLET atorvastatin (LIPITOR) 20 MG tablet      Take 1 tablet (20 mg total) by mouth daily.    Take 1 tablet (20 mg total) by mouth daily.   ESOMEPRAZOLE (NEXIUM) 40 MG CAPSULE esomeprazole (NEXIUM) 40 MG capsule      Take 1 capsule by mouth daily before breakfast    Take 1 capsule by mouth daily before breakfast  Discontinued Medications   No medications on file

## 2011-08-20 NOTE — Patient Instructions (Addendum)
Today we updated your med list in our EPIC system...    Continue your current medications the same...    We refilled your meds per request...  Please return to our lab one morning at your convenience for your follow up FASTING blood work...    We will call you w/ the results when avail...  Let's get on track w/ our diet & exercise program...    The goal is to lose 15-20 lbs!!!  For your constipation:    Try the save OTC stool softeners & laxatives> MIRALAX (mix 1 capful in water daily; or SENAKOT-S 1-2 tabs at bedtime daily...  Call for any questions...  Let's plan a follow up physical in 1 yr, sooner if needed for any problems.Marland KitchenMarland Kitchen

## 2011-08-25 ENCOUNTER — Other Ambulatory Visit: Payer: Self-pay | Admitting: Pulmonary Disease

## 2011-08-25 DIAGNOSIS — K219 Gastro-esophageal reflux disease without esophagitis: Secondary | ICD-10-CM

## 2011-08-27 ENCOUNTER — Other Ambulatory Visit (INDEPENDENT_AMBULATORY_CARE_PROVIDER_SITE_OTHER): Payer: BC Managed Care – PPO

## 2011-08-27 DIAGNOSIS — N139 Obstructive and reflux uropathy, unspecified: Secondary | ICD-10-CM

## 2011-08-27 DIAGNOSIS — E7800 Pure hypercholesterolemia, unspecified: Secondary | ICD-10-CM

## 2011-08-27 DIAGNOSIS — R011 Cardiac murmur, unspecified: Secondary | ICD-10-CM

## 2011-08-27 DIAGNOSIS — K219 Gastro-esophageal reflux disease without esophagitis: Secondary | ICD-10-CM

## 2011-08-27 DIAGNOSIS — N320 Bladder-neck obstruction: Secondary | ICD-10-CM

## 2011-08-27 DIAGNOSIS — E663 Overweight: Secondary | ICD-10-CM

## 2011-08-27 LAB — HEPATIC FUNCTION PANEL
ALT: 35 U/L (ref 0–53)
Bilirubin, Direct: 0.2 mg/dL (ref 0.0–0.3)
Total Bilirubin: 1.2 mg/dL (ref 0.3–1.2)
Total Protein: 6.9 g/dL (ref 6.0–8.3)

## 2011-08-27 LAB — LIPID PANEL: Cholesterol: 134 mg/dL (ref 0–200)

## 2011-08-27 LAB — CBC WITH DIFFERENTIAL/PLATELET
Eosinophils Absolute: 0.2 10*3/uL (ref 0.0–0.7)
HCT: 42.5 % (ref 39.0–52.0)
Lymphocytes Relative: 26.7 % (ref 12.0–46.0)
Lymphs Abs: 2.1 10*3/uL (ref 0.7–4.0)
MCV: 87.2 fl (ref 78.0–100.0)
Monocytes Relative: 7.6 % (ref 3.0–12.0)
Neutrophils Relative %: 61.8 % (ref 43.0–77.0)
Platelets: 219 10*3/uL (ref 150.0–400.0)
RDW: 13.6 % (ref 11.5–14.6)
WBC: 7.7 10*3/uL (ref 4.5–10.5)

## 2011-08-27 LAB — BASIC METABOLIC PANEL
BUN: 17 mg/dL (ref 6–23)
CO2: 31 mEq/L (ref 19–32)
Calcium: 9.4 mg/dL (ref 8.4–10.5)
Creatinine, Ser: 1.1 mg/dL (ref 0.4–1.5)
GFR: 69.73 mL/min (ref >60.00)

## 2012-08-25 ENCOUNTER — Ambulatory Visit: Payer: BC Managed Care – PPO | Admitting: Pulmonary Disease

## 2012-09-10 ENCOUNTER — Other Ambulatory Visit: Payer: Self-pay | Admitting: Pulmonary Disease

## 2012-11-01 ENCOUNTER — Encounter: Payer: Self-pay | Admitting: Pulmonary Disease

## 2012-11-01 ENCOUNTER — Ambulatory Visit (INDEPENDENT_AMBULATORY_CARE_PROVIDER_SITE_OTHER): Payer: BC Managed Care – PPO | Admitting: Pulmonary Disease

## 2012-11-01 ENCOUNTER — Ambulatory Visit (INDEPENDENT_AMBULATORY_CARE_PROVIDER_SITE_OTHER)
Admission: RE | Admit: 2012-11-01 | Discharge: 2012-11-01 | Disposition: A | Discharge status: Home / Self Care | Payer: BC Managed Care – PPO | Source: Ambulatory Visit | Attending: Pulmonary Disease | Admitting: Pulmonary Disease

## 2012-11-01 VITALS — BP 142/92 | HR 82 | Temp 98.3°F | Ht 71.0 in | Wt 220.4 lb

## 2012-11-01 DIAGNOSIS — E7800 Pure hypercholesterolemia, unspecified: Secondary | ICD-10-CM

## 2012-11-01 DIAGNOSIS — K219 Gastro-esophageal reflux disease without esophagitis: Secondary | ICD-10-CM

## 2012-11-01 DIAGNOSIS — E663 Overweight: Secondary | ICD-10-CM

## 2012-11-01 DIAGNOSIS — J310 Chronic rhinitis: Secondary | ICD-10-CM

## 2012-11-01 DIAGNOSIS — Z23 Encounter for immunization: Secondary | ICD-10-CM

## 2012-11-01 DIAGNOSIS — M1990 Unspecified osteoarthritis, unspecified site: Secondary | ICD-10-CM

## 2012-11-01 DIAGNOSIS — M545 Low back pain: Secondary | ICD-10-CM

## 2012-11-01 DIAGNOSIS — Z862 Personal history of diseases of the blood and blood-forming organs and certain disorders involving the immune mechanism: Secondary | ICD-10-CM

## 2012-11-01 DIAGNOSIS — R011 Cardiac murmur, unspecified: Secondary | ICD-10-CM

## 2012-11-01 DIAGNOSIS — F419 Anxiety disorder, unspecified: Secondary | ICD-10-CM

## 2012-11-01 DIAGNOSIS — Z800 Family history of malignant neoplasm of digestive organs: Secondary | ICD-10-CM

## 2012-11-01 DIAGNOSIS — N320 Bladder-neck obstruction: Secondary | ICD-10-CM

## 2012-11-01 DIAGNOSIS — R0989 Other specified symptoms and signs involving the circulatory and respiratory systems: Secondary | ICD-10-CM

## 2012-11-01 MED ORDER — FLUTICASONE PROPIONATE 50 MCG/ACT NA SUSP: 2.0000 | NASAL | Status: DC | PRN

## 2012-11-01 NOTE — Progress Notes (Addendum)
Subjective:    Patient ID: Jerry Ferguson, male    DOB: Oct 15, 1945, 67 y.o.   MRN: [id number]  HPI 67 y/o WM here for a follow up visit...  ~  January 31, 2009:  29mo ROV doing well- needs meds refilled for 2011... he's had additional prob w/ dust & nasal symptoms at work- we discussed Rx w/ Saline + Flonase Qhs...  due for Fasting labs and overdue for f/u colon (we will refer to GI)...  ~  May 14, 2010:  67mo ROV- Colie's wife, Jerry Ferguson, passed away July 17, 2022 w/ multisys dis (CAD, CHF, resp fail- made it out to rehab at [facility] & died in her sleep);  He has noted incr fatigue, tired all the time, LBP (worse sitting), bilat ankle pain/ paresthesias ("even the covers make them hurt"), & he notes all this seemed to occur when they switched him to generic Lipitor;  Breathing has been good- denies CP/ palpit/ dizzy/ syncope/ edema...  Needs refill prescriptions & ret for fasting labs> all WNL.Marland Kitchen.  ~  August 20, 2011:  67mo ROV & Alanson has been doing well- no new complaints or concerns... He notes some constipation & we discussed the use of Miralax & Senakot-S;  He remains on Lip20, Nexium40, & Metamucil;  He notes that heartburn returns after 2-3d off the PPI Rx..    We reviewed prob list, meds, xrays and labs> see below for details >> LABS 8/13:  FLP- at goals on Lip20;  Chems- ok w/ BS=108;  CBC- wnl;  TSH=1.91;  PSA=1.58   ~  November 01, 2012:  67mo ROV & Jerry Ferguson is still working at a [employer];  He notes some intermittent right shoulder & arm discomfort w/ ? hx old injury and rec to use Aleve, Tylenol, etc;  We reviewed the following medical problems during today's office visit >>     AR & Bronchitis> on Flonase & saline; exposed to dust at work but no recent sinus or bronchial symptoms...    HxCP & Murmur> on ASA81; hx atypCP and prev 2DEcho 2010 w/ incr AoV thickness=> rec to repeat 2DEcho & check CDopplers=> pending.    Chol> on Lip20;  FLP 10/14 shows TChol 142, TG 86, HDL 40, LDL 85 and rec  to continue same...    Overweight> weight = 220# w/ BMI=31;  Labs showed FBS=116 c/w mild DM & must elim sweets & get wt down...    HxGERD & +FamHx colon ca> on Nexium40; Gerd controlled on med & +FamHx colon ca in father (died age8) & sister; he had neg colon 2004 but overdue for f/u procedure & we will refer.    HxUTI/ sepsis> this occurred in 2007, no prob since then; no voiding symptoms, good stream, etc; PSA= 1.81 Oct2014...    DJD, LBP> mostly c/o shoulders and arms; offered Tramadol but he prefers Aleve & Tylenol for now... We reviewed prob list, meds, xrays and labs> see below for updates >> OK 2014 Flu vaccine today... CXR 10/14 showed norm heart size, clear lungs, NAD; old healed fx left humeral neck... LABS 10/14:  FLP- at goals on Lip20;  Chems- wnl x BS=116 (borderlineDM);  CBC- wnl;  TSH=1.56;  PSA=1.81... CDopplers => done 11/4 &  showed 66mo... showed mod heterogen plaque bilat, 60-79% bilat ICAstenoses; repeat 29mo...  ADDENDUM>>  2DEcho 11/14 showed mild LVH, norm LVF w/ EF=55-60%, Gr1DD, abn AoV w/ mild to mod AS & atrial septal aneuysm noted => REFER TO CARDS.  Problem List:  CHRONIC RHINITIS (ICD-472.0) - he is exposed to dust etc at work... we discussed Rx w/ saline & FLONASE Qhs...  ACUTE BRONCHITIS (ICD-466.0) - he is an ex-smoker, quit 1992... he also has hx of prev LLL pneumonia... ~  CXR 2/10 was clear & WNL.Marland Kitchen. ~  CXR 10/14 showed norm heart size, clear lungs, NAD; old healed fx left humeral neck..  CHEST PAIN (ICD-786.50) - hx atypical CP in past; on ASA 81mg /d... ~  EKG 2/10 showed NSR, NAD...  CARDIAC MURMUR (ICD-785.2) - sys murmur at base=> 2DEcho 11/14 w/ mild to mod AS ~  2DEcho 2/10 showed norm LV size & function w/ EF= 60%; no regional wall motion abn; incr AoV thickness. ~  2DEcho 11/14 => mild LVH, norm LVF w/ EF=55-60%, Gr1DD, abn AoV w/ mild to mod AS & atrial septal aneuysm noted => refer to Cards. ~  CDopplers 11/14 => mod heterogen plaque bilat, 60-79%  bilat ICAstenoses; continue HYQ65 & repeat 66mo...  HYPERCHOLESTEROLEMIA (ICD-272.0) - on LIPITOR 20mg /d + low chol/ low fat diet... ~  before meds:  Chol~270, TG~500 ~  FLP 7/07 in hosp showed TChol 105, TG 181, HDL 35, LDL 34 ~  FLP 11/09 showed TChol 161, TG 77, HDL 40, LDL 106 ~  FLP 1/11 on Lip20 showed  TChol 133, TG 101, HDL 39, LDL 74 ~  FLP 5/12 on Lip20 showed TChol 149, TG 65, HDL 41, LDL 95> presented w/ mult symptoms & going to try off the Atorva20 for one month. ~  FLP 8/13 on Lip20 showed TChol 134, TG 98, HDL 39, LDL 76 `  FLP 10/14 on Lip20 showed TChol 142, TG 86, HDL 40, LDL 85  OVERWEIGHT (ICD-278.02) - he has been counselled on diet + exercise program...  ~  weight 7/07 post-hosp = 207# ~  weight 2/10 up 13#  to 220# ~  Weight 5/12 =219# ~  Weight 8/13 = 223# & BMI~31 ~  Weight 10/14 = 220#  GERD (ICD-530.81) - on NEXIUM 40mg /d, which he says he really needs due to GERD symptoms... ~  EGD 9/04 by DrGessner showed prolapsing HH, no esophagitis or Barrett's seen...  Family Hx of ADENOCARCINOMA, COLON (ICD-153.9) - his father died at age 66 w/ colon cancer...  ~  last colonoscopy 10/04 by DrGessnewr showed hem's only... f/u planned 54yrs and now overdue... ~  1/11, 5/12 & 8/13:  Reminded at each visit to set up his f/u colonoscopy => we will refer to DrGessner.  LIVER FUNCTION TESTS, ABNORMAL, HX OF (ICD-V12.2) - likely related to hepatic steatosis... followed by Rodena Medin, but I can't locate prev imaging studies... ~  LFTs 1/11 were normal w/ SGOT= 23, SGPT= 30 ~  LFTs on yearly labs have all remained WNL.Marland KitchenMarland Kitchen  Hx of UTI (ICD-599.0) - HOSP Jul07 w/ urosepsis from sens EColi- Rx's w/ Cipro... PSA was elevated and ret to normal after infection resolved...  ~  labs 11/09 showed PSA= 1.39 ~  labs 1/11 showed PSA= 2.09 ~  Labs 5/12 showed PSA= 1.86 ~  Labs 8/13 showed PSA= 1.58 ~  Labs 10/14 showed PSA = 1.81  DEGENERATIVE JOINT DISEASE (ICD-715.90) - he has DJD symptoms  in hands & states "I have weak ankles"...  BACK PAIN, LUMBAR (ICD-724.2)   History reviewed. No pertinent past surgical history.   Outpatient Encounter Prescriptions as of 11/01/2012  Medication Sig Dispense Refill  . aspirin 81 MG tablet Take 81 mg by mouth daily.        Marland Kitchen  atorvastatin (LIPITOR) 20 MG tablet TAKE 1 TABLET (20 MG TOTAL) BY MOUTH DAILY.  30 tablet  2  . NEXIUM 40 MG capsule TAKE 1 CAPSULE BY MOUTH DAILY BEFORE BREAKFAST  30 capsule  11  . fluticasone (FLONASE) 50 MCG/ACT nasal spray Place 2 sprays into the nose as needed.        No facility-administered encounter medications on file as of 11/01/2012.    No Known Allergies   Current Medications, Allergies, Past Medical History, Past Surgical History, Family History, and Social History were reviewed in [employer] record.   Review of Systems        See HPI - all other systems neg except as noted... The patient denies anorexia, fever, weight loss, weight gain, vision loss, decreased hearing, hoarseness, chest pain, syncope, dyspnea on exertion, peripheral edema, prolonged cough, headaches, hemoptysis, abdominal pain, melena, hematochezia, severe indigestion/heartburn, hematuria, incontinence, muscle weakness, suspicious skin lesions, transient blindness, difficulty walking, depression, unusual weight change, abnormal bleeding, enlarged lymph nodes, and angioedema.   Objective:   Physical Exam    WD, WN, 67 y/o WM in NAD... GENERAL:  Alert & oriented; pleasant & cooperative. HEENT:  [place]/AT, EOM-wnl, PERRLA, EACs-clear, TMs-wnl, NOSE-clear, THROAT-clear & wnl. NECK:  Supple w/ fairROM; no JVD; normal carotid impulses w/ ? bruit; no thyromegaly or nodules palpated; no lymphadenopathy. CHEST:  Clear to P & A; without wheezes/ rales/ or rhonchi. HEART:  Regular Rhythm; gr 1-2/6 SEM base w/o rubs or gallops... ABDOMEN:  Soft & nontender; normal bowel sounds; no organomegaly or masses detected. EXT:  without deformities, mild arthritic changes; no varicose veins/ venous insuffic/ or edema. NEURO:  CN's intact; motor testing normal; sensory testing normal; gait normal & balance OK. DERM:  No lesions noted; no rash etc...  RADIOLOGY DATA:  Reviewed in the EPIC EMR & discussed w/ the patient...  LABORATORY DATA:  Reviewed in the EPIC EMR & discussed w/ the patient...   Assessment & Plan:    Rhinitis/ Bronchitis>  No recent resp track problems...  Murmur & ?Bruit>  Mod plaque & 60-79% stenosis bilat; rec to continue statin, ASA & follow up... Mild to mod AS on 2DEcho=> refer to Cards  CHOL>  FLP looks good on the Lip20 + diet etc...  GERD>  Stable on the Nexium   Due for f/u colonoscopy>  +fam hx colon cancer & we will refer to DrGessner...  DJD, Back pain> uses OTC meds as needed...  Other medical problems as noted...   Patient's Medications  New Prescriptions   No medications on file  Previous Medications   ASPIRIN 81 MG TABLET    Take 81 mg by mouth daily.     ATORVASTATIN (LIPITOR) 20 MG TABLET    TAKE 1 TABLET (20 MG TOTAL) BY MOUTH DAILY.   NEXIUM 40 MG CAPSULE    TAKE 1 CAPSULE BY MOUTH DAILY BEFORE BREAKFAST  Modified Medications   Modified Medication Previous Medication   FLUTICASONE (FLONASE) 50 MCG/ACT NASAL SPRAY fluticasone (FLONASE) 50 MCG/ACT nasal spray      Place 2 sprays into the nose as needed.    Place 2 sprays into the nose as needed.   Discontinued Medications   No medications on file

## 2012-11-01 NOTE — Patient Instructions (Signed)
Today we updated your med list in our EPIC system...    Continue your current medications the same...    We refilled your meds including the Flonase, Nexium, and Lipitor ([employer])...  Today we did your follow up CXR... Please return to our lab one morning this week for your FASTING blood work...    We will contact you w/ the results when available...   We will arrange for a follow up 2DEchocardiogram and a Carotid doppler study to check the bllod flow in your carotid arteries...  Finally we will aske drGessner in GI to review your record 7 set up a Colonoscopy at your convenience...  We gave you the 2014 Flu vaccine today...   Call for any questions...  Let's plan a follow up visit in 55mo, sooner if needed for problems.Marland KitchenMarland Kitchen

## 2012-11-04 ENCOUNTER — Other Ambulatory Visit (INDEPENDENT_AMBULATORY_CARE_PROVIDER_SITE_OTHER): Payer: BC Managed Care – PPO

## 2012-11-04 DIAGNOSIS — R011 Cardiac murmur, unspecified: Secondary | ICD-10-CM

## 2012-11-04 DIAGNOSIS — F419 Anxiety disorder, unspecified: Secondary | ICD-10-CM

## 2012-11-04 DIAGNOSIS — E7800 Pure hypercholesterolemia, unspecified: Secondary | ICD-10-CM

## 2012-11-04 DIAGNOSIS — F411 Generalized anxiety disorder: Secondary | ICD-10-CM

## 2012-11-04 DIAGNOSIS — N320 Bladder-neck obstruction: Secondary | ICD-10-CM

## 2012-11-04 LAB — CBC WITH DIFFERENTIAL/PLATELET
Basophils Relative: 0.7 % (ref 0.0–3.0)
Eosinophils Absolute: 0.3 10*3/uL (ref 0.0–0.7)
Eosinophils Relative: 4.4 % (ref 0.0–5.0)
Lymphocytes Relative: 28.3 % (ref 12.0–46.0)
MCHC: 34.5 g/dL (ref 30.0–36.0)
MCV: 85.2 fl (ref 78.0–100.0)
Monocytes Absolute: 0.6 10*3/uL (ref 0.1–1.0)
Neutrophils Relative %: 57.1 % (ref 43.0–77.0)
Platelets: 178 10*3/uL (ref 150.0–400.0)
RBC: 4.84 Mil/uL (ref 4.22–5.81)
RDW: 13 % (ref 11.5–14.6)
WBC: 6.4 10*3/uL (ref 4.5–10.5)

## 2012-11-04 LAB — HEPATIC FUNCTION PANEL
ALT: 36 U/L (ref 0–53)
Albumin: 4.1 g/dL (ref 3.5–5.2)
Alkaline Phosphatase: 63 U/L (ref 39–117)
Total Protein: 6.8 g/dL (ref 6.0–8.3)

## 2012-11-04 LAB — BASIC METABOLIC PANEL
BUN: 14 mg/dL (ref 6–23)
CO2: 28 mEq/L (ref 19–32)
Chloride: 105 mEq/L (ref 96–112)
Creatinine, Ser: 1.1 mg/dL (ref 0.4–1.5)
Potassium: 5.4 mEq/L — ABNORMAL HIGH (ref 3.5–5.1)

## 2012-11-04 LAB — LIPID PANEL
Cholesterol: 142 mg/dL (ref 0–200)
Triglycerides: 86 mg/dL (ref 0.0–149.0)
VLDL: 17.2 mg/dL (ref 0.0–40.0)

## 2012-11-04 LAB — TSH: TSH: 1.56 u[IU]/mL (ref 0.35–5.50)

## 2012-11-04 LAB — PSA: PSA: 1.81 ng/mL (ref 0.10–4.00)

## 2012-11-05 NOTE — Progress Notes (Signed)
Quick Note:  Pt aware of results. ______ 

## 2012-11-09 ENCOUNTER — Ambulatory Visit (HOSPITAL_COMMUNITY): Payer: BC Managed Care – PPO | Attending: Pulmonary Disease

## 2012-11-09 DIAGNOSIS — I658 Occlusion and stenosis of other precerebral arteries: Secondary | ICD-10-CM | POA: Insufficient documentation

## 2012-11-09 DIAGNOSIS — R0989 Other specified symptoms and signs involving the circulatory and respiratory systems: Secondary | ICD-10-CM | POA: Insufficient documentation

## 2012-11-09 DIAGNOSIS — I6529 Occlusion and stenosis of unspecified carotid artery: Secondary | ICD-10-CM

## 2012-11-09 DIAGNOSIS — E785 Hyperlipidemia, unspecified: Secondary | ICD-10-CM | POA: Insufficient documentation

## 2012-11-16 ENCOUNTER — Ambulatory Visit (HOSPITAL_COMMUNITY): Payer: BC Managed Care – PPO | Attending: Cardiology | Admitting: Cardiology

## 2012-11-16 ENCOUNTER — Encounter: Payer: Self-pay | Admitting: Cardiology

## 2012-11-16 DIAGNOSIS — E785 Hyperlipidemia, unspecified: Secondary | ICD-10-CM | POA: Insufficient documentation

## 2012-11-16 DIAGNOSIS — R011 Cardiac murmur, unspecified: Secondary | ICD-10-CM | POA: Insufficient documentation

## 2012-11-16 NOTE — Progress Notes (Signed)
Echo performed. 

## 2012-11-17 ENCOUNTER — Other Ambulatory Visit: Payer: Self-pay | Admitting: Pulmonary Disease

## 2012-11-17 DIAGNOSIS — R011 Cardiac murmur, unspecified: Secondary | ICD-10-CM

## 2012-11-25 ENCOUNTER — Encounter: Payer: Self-pay | Admitting: *Deleted

## 2012-11-26 ENCOUNTER — Encounter: Payer: Self-pay | Admitting: Cardiology

## 2012-11-26 ENCOUNTER — Ambulatory Visit (INDEPENDENT_AMBULATORY_CARE_PROVIDER_SITE_OTHER): Payer: BC Managed Care – PPO | Admitting: Cardiology

## 2012-11-26 VITALS — BP 142/90 | HR 71 | Ht 71.0 in | Wt 222.1 lb

## 2012-11-26 DIAGNOSIS — I10 Essential (primary) hypertension: Secondary | ICD-10-CM

## 2012-11-26 DIAGNOSIS — I359 Nonrheumatic aortic valve disorder, unspecified: Secondary | ICD-10-CM

## 2012-11-26 DIAGNOSIS — I658 Occlusion and stenosis of other precerebral arteries: Secondary | ICD-10-CM

## 2012-11-26 DIAGNOSIS — I6529 Occlusion and stenosis of unspecified carotid artery: Secondary | ICD-10-CM

## 2012-11-26 DIAGNOSIS — I350 Nonrheumatic aortic (valve) stenosis: Secondary | ICD-10-CM

## 2012-11-26 DIAGNOSIS — I779 Disorder of arteries and arterioles, unspecified: Secondary | ICD-10-CM

## 2012-11-26 DIAGNOSIS — I6523 Occlusion and stenosis of bilateral carotid arteries: Secondary | ICD-10-CM

## 2012-11-26 MED ORDER — METOPROLOL TARTRATE 25 MG PO TABS: ORAL_TABLET | ORAL | Status: DC

## 2012-11-26 MED ORDER — ATORVASTATIN CALCIUM 40 MG PO TABS: 40.0000 mg | ORAL_TABLET | Freq: Every day | ORAL | Status: DC

## 2012-11-26 NOTE — Progress Notes (Signed)
Patient ID: Jerry Ferguson, male   DOB: 1945/05/24, 67 y.o.   MRN: [id number]     Patient Name: Jerry Ferguson Date of Encounter: 11/26/2012  Primary Care Provider:  Michele Mcalpine, MD Primary Cardiologist:  Tobias Alexander, H   Problem List   Past Medical History  Diagnosis Date  . Chronic rhinitis   . Acute bronchitis   . Chest pain   . Cardiac murmur   . Hypercholesteremia   . Overweight(278.02)   . GERD (gastroesophageal reflux disease)   . Abnormal liver function test   . History of UTI   . DJD (degenerative joint disease)   . Lumbar back pain    No past surgical history on file.  Allergies  No Known Allergies  HPI  67 year old male with h/o hyperlipidemia who experienced 2 episodes of near syncope in the last summer. He underwent Duplex of carotids that showed moderate to severe heterogenous plague bilateraly in the bifurcation extending into both ICAs with associated 60-79% stenosis.  The patient has h/o smoking. He has never been treated for hypertension. He denies any chest pain SOB or palpitations. He still works at the [employer].The episodes of presyncope are quite concerning for him. He denies orthopnea, PND or lower extremity edema.  Home Medications  Prior to  hyperlipidemia,Admission medications   Medication Sig Start Date End Date Taking? Authorizing Provider  aspirin 81 MG tablet Take 81 mg by mouth daily.      Historical Provider, MD  atorvastatin (LIPITOR) 20 MG tablet TAKE 1 TABLET (20 MG TOTAL) BY MOUTH DAILY. 09/10/12   Michele Mcalpine, MD  fluticasone (FLONASE) 50 MCG/ACT nasal spray Place 2 sprays into the nose as needed. 11/01/12   Michele Mcalpine, MD  NEXIUM 40 MG capsule TAKE 1 CAPSULE BY MOUTH DAILY BEFORE BREAKFAST 09/10/12   Michele Mcalpine, MD    Family History  Family History  Problem Relation Age of Onset  . Prostate cancer Brother   . Colon cancer Father     metastatic  . Other Father     DJD    Social  History  History   Social History  . Marital Status: Married    Spouse Name: stacey(deceased May 22, 2009)    Number of Children: 1  . Years of Education: N/A   Occupational History  . welder    Social History Main Topics  . Smoking status: Former Smoker -- 1.00 packs/day for 15 years    Types: Cigarettes    Quit date: 01/06/1989  . Smokeless tobacco: Former Neurosurgeon     Comment: chewed some in early 23-May-2022  . Alcohol Use: No  . Drug Use: No  . Sexual Activity: Not on file   Other Topics Concern  . Not on file   Social History Narrative   Uncle with prostate cancer?     Review of Systems, as per HPI, otherwise negative General:  No chills, fever, night sweats or weight changes.  Cardiovascular:  No chest pain, dyspnea on exertion, edema, orthopnea, palpitations, paroxysmal nocturnal dyspnea. Dermatological: No rash, lesions/masses Respiratory: No cough, dyspnea Urologic: No hematuria, dysuria Abdominal:   No nausea, vomiting, diarrhea, bright red blood per rectum, melena, or hematemesis Neurologic:  No visual changes, wkns, changes in mental status. All other systems reviewed and are otherwise negative except as noted above.  Physical Exam  BP 142/90, HR 71 General: Pleasant, NAD Psych: Normal affect. Neuro: Alert and oriented X 3. Moves all extremities spontaneously. HEENT:  Normal  Neck: Supple without bruits or JVD. Lungs:  Resp regular and unlabored, CTA. Heart: RRR no s3, s4, holosystolic murmur, present S2. Abdomen: Soft, non-tender, non-distended, BS + x 4.  Extremities: No clubbing, cyanosis or edema. DP/PT/Radials 2+ and equal bilaterally.  Labs:  No results found for this basename: CKTOTAL, CKMB, TROPONINI,  in the last 72 hours Lab Results  Component Value Date   WBC 6.4 11/04/2012   HGB 14.2 11/04/2012   HCT 41.2 11/04/2012   MCV 85.2 11/04/2012   PLT 178.0 11/04/2012   No results found for this basename: NA, K, CL, CO2, BUN, CREATININE, CALCIUM, LABALBU,  PROT, BILITOT, ALKPHOS, ALT, AST, GLUCOSE,  in the last 168 hours Lab Results  Component Value Date   CHOL 142 11/04/2012   HDL 39.70 11/04/2012   LDLCALC 85 11/04/2012   TRIG 86.0 11/04/2012   No results found for this basename: DDIMER   No components found with this basename: POCBNP,   Accessory Clinical Findings  echocardiogram  ECG - sinus rhythm, 71 beats per minute, T wave abnormality, consider inferior ischemia, abnormal EKG  TTE 11/16/2012 Left ventricle: The cavity size was normal. Wall thickness was increased in a pattern of mild LVH. Systolic function was normal. The estimated ejection fraction was in the range of 55% to 60%. Wall motion was normal; there were no regional wall motion abnormalities. Doppler parameters are consistent with abnormal left ventricular relaxation (grade 1 diastolic dysfunction). ------------------------------------------------------------ Aortic valve: Trileaflet; moderately calcified leaflets. Valve mobility was restricted. Doppler: There was mild to moderate stenosis. Trivial regurgitation. VTI ratio of LVOT to aortic valve: 0.42. Valve area: 1.45cm^2(VTI). Indexed valve area: 0.66cm^2/m^2 (VTI). Peak velocity ratio of LVOT to aortic valve: 0.41. Valve area: 1.41cm^2 (Vmax). Indexed valve area: 0.64cm^2/m^2 (Vmax). Mean gradient: 19mm Hg (S). Peak gradient: 32mm Hg (S). ------------------------------------------------------------ Aorta: Aortic root: The aortic root was normal in size. ------------------------------------------------------------ Mitral valve: Calcified annulus. Mobility was not restricted. Doppler: Transvalvular velocity was within the normal range. There was no evidence for stenosis. Trivial regurgitation. ------------------------------------------------------------ Left atrium: The atrium was mildly dilated. ------------------------------------------------------------ Atrial septum: There was an atrial septal  aneurysm. ------------------------------------------------------------ Right ventricle: The cavity size was normal. Systolic function was normal. ------------------------------------------------------------ Pulmonic valve: Doppler: Transvalvular velocity was within the normal range. There was no evidence for stenosis. ------------------------------------------------------------ Tricuspid valve: Structurally normal valve. Doppler: Transvalvular velocity was within the normal range. Trivial regurgitation. ------------------------------------------------------------ Pulmonary artery: Systolic pressure was within the normal range. ------------------------------------------------------------ Right atrium: The atrium was normal in size. ------------------------------------------------------------ Pericardium: There was no pericardial effusion.     Assessment & Plan  67 year old male  1. 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 We will refer him to the vascular surgery.   2. Hypertension - start metoprolol 25 mg PO BID  3. Mild to moderate aortic stenosis in a trileaflet valve - asymptomatic, we will repeat the echo in 1 year  3. Hyperlipidemia - lipid panel at goal, however considering significant carotid disease and aortic stenosis at quite young age we will increase crestor to 40 mg po daily  Follow up in 6 months with CMP.     Lars Masson, MD, [facility] 11/26/2012, 3:54 PM

## 2012-11-26 NOTE — Patient Instructions (Addendum)
Start Metoprolol 25 mg 1/2 tablet twice a day   Increase Lipitor 40 mg daily    Refer to Vascular Surgery for bilateral carotid stenosis     Your physician wants you to follow-up in: 6 months You will receive a reminder letter in the mail two months in advance. If you don't receive a letter, please call our office to schedule the follow-up appointment.

## 2012-11-28 DIAGNOSIS — I10 Essential (primary) hypertension: Secondary | ICD-10-CM | POA: Insufficient documentation

## 2012-11-28 DIAGNOSIS — I350 Nonrheumatic aortic (valve) stenosis: Secondary | ICD-10-CM | POA: Insufficient documentation

## 2012-11-28 DIAGNOSIS — I6529 Occlusion and stenosis of unspecified carotid artery: Secondary | ICD-10-CM | POA: Insufficient documentation

## 2012-11-30 ENCOUNTER — Encounter: Payer: BC Managed Care – PPO | Admitting: Vascular Surgery

## 2012-11-30 ENCOUNTER — Other Ambulatory Visit (HOSPITAL_COMMUNITY): Payer: BC Managed Care – PPO

## 2012-11-30 ENCOUNTER — Other Ambulatory Visit: Payer: Self-pay | Admitting: Vascular Surgery

## 2012-12-13 ENCOUNTER — Encounter: Payer: Self-pay | Admitting: Pulmonary Disease

## 2012-12-15 ENCOUNTER — Encounter: Payer: Self-pay | Admitting: Vascular Surgery

## 2012-12-31 ENCOUNTER — Other Ambulatory Visit (HOSPITAL_COMMUNITY): Payer: BC Managed Care – PPO

## 2012-12-31 ENCOUNTER — Encounter: Payer: BC Managed Care – PPO | Admitting: Vascular Surgery

## 2013-01-13 ENCOUNTER — Encounter: Payer: Self-pay | Admitting: Vascular Surgery

## 2013-01-14 ENCOUNTER — Encounter: Payer: Self-pay | Admitting: Vascular Surgery

## 2013-01-14 ENCOUNTER — Ambulatory Visit (INDEPENDENT_AMBULATORY_CARE_PROVIDER_SITE_OTHER): Payer: BC Managed Care – PPO | Admitting: Vascular Surgery

## 2013-01-14 ENCOUNTER — Ambulatory Visit (HOSPITAL_COMMUNITY)
Admission: RE | Admit: 2013-01-14 | Discharge: 2013-01-14 | Disposition: A | Discharge status: Home / Self Care | Payer: BC Managed Care – PPO | Source: Ambulatory Visit | Attending: Vascular Surgery | Admitting: Vascular Surgery

## 2013-01-14 VITALS — BP 157/87 | HR 67 | Resp 18 | Ht 71.0 in | Wt 223.0 lb

## 2013-01-14 DIAGNOSIS — I6523 Occlusion and stenosis of bilateral carotid arteries: Secondary | ICD-10-CM

## 2013-01-14 DIAGNOSIS — I6529 Occlusion and stenosis of unspecified carotid artery: Secondary | ICD-10-CM

## 2013-01-14 DIAGNOSIS — I658 Occlusion and stenosis of other precerebral arteries: Secondary | ICD-10-CM | POA: Insufficient documentation

## 2013-01-14 NOTE — Progress Notes (Signed)
New Carotid Patient  Referred by:  Michele McalpineScott M Nadel, MD [address] [place], [place] [id number]  Reason for referral: multiple near syncopal events  History of Present Illness  Jerry Ferguson is a 68 y.o. (10/27/1945) male who presents with chief complaint: multiple near syncopal episode.  Previous carotid studies demonstrated: RICA 60-79% stenosis, LICA 60-79% stenosis.  Patient has no history of TIA or stroke symptom.  The patient has never had amaurosis fugax or monocular blindness.  The patient has never had facial drooping or hemiplegia.  The patient has never had receptive or expressive aphasia.   The patient's risks factors for carotid disease include: hypercholesteremia, HTN, and prior smoker.  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  He is not clear if he was dehydrated at the time.  He does not describe hypoglycemia or cardiac arrhythmia.  He denies any vertebrobasilar sx.  Past Medical History  Diagnosis Date  . Chronic rhinitis   . Acute bronchitis   . Chest pain   . Cardiac murmur   . Hypercholesteremia   . Overweight(278.02)   . GERD (gastroesophageal reflux disease)   . Abnormal liver function test   . History of UTI   . DJD (degenerative joint disease)   . Lumbar back pain   . Carotid artery occlusion     Bilateral Bruit  . Hypertension   . Aortic valve disorders   . Allergy     History reviewed. No pertinent past surgical history.  History   Social History  . Marital Status: Married    Spouse Name: stacey(deceased 2011)    Number of Children: 1  . Years of Education: N/A   Occupational History  . welder    Social History Main Topics  . Smoking status: Former Smoker -- 1.00 packs/day for 15 years    Types: Cigarettes    Quit date: 01/06/1989  . Smokeless tobacco: Never Used     Comment: chewed some in early 20's  . Alcohol Use: No  . Drug Use: No  . Sexual Activity: Not on file   Other Topics Concern    . Not on file   Social History Narrative   Uncle with prostate cancer?    Family History  Problem Relation Age of Onset  . Prostate cancer Brother   . Colon cancer Father     metastatic  . Other Father     DJD  . Cancer Father     Colon-Metastatic  . Arthritis Father     DJD  . Cancer Brother     Prostate   Current Outpatient Prescriptions on File Prior to Visit  Medication Sig Dispense Refill  . aspirin 81 MG tablet Take 81 mg by mouth daily.        Marland Kitchen. atorvastatin (LIPITOR) 40 MG tablet Take 1 tablet (40 mg total) by mouth daily.  30 tablet  6  . fluticasone (FLONASE) 50 MCG/ACT nasal spray Place 2 sprays into the nose as needed.  16 g  11  . metoprolol tartrate (LOPRESSOR) 25 MG tablet Take 1/2 tablet twice a day  30 tablet  6  . Multiple Vitamins-Minerals (CENTRUM SILVER PO) Take 1 tablet by mouth daily.      Marland Kitchen. NEXIUM 40 MG capsule TAKE 1 CAPSULE BY MOUTH DAILY BEFORE BREAKFAST  30 capsule  11   No current facility-administered medications on file prior to visit.    No Known Allergies  REVIEW OF  SYSTEMS:  (Positives checked otherwise negative)  CARDIOVASCULAR:  []  chest pain, []  chest pressure, []  palpitations, []  shortness of breath when laying flat, []  shortness of breath with exertion,  []  pain in feet when walking, []  pain in feet when laying flat, []  history of blood clot in veins (DVT), []  history of phlebitis, []  swelling in legs, []  varicose veins  PULMONARY:  []  productive cough, []  asthma, []  wheezing  NEUROLOGIC:  []  weakness in arms or legs, []  numbness in arms or legs, []  difficulty speaking or slurred speech, []  temporary loss of vision in one eye, []  dizziness  HEMATOLOGIC:  []  bleeding problems, []  problems with blood clotting too easily  MUSCULOSKEL:  []  joint pain, []  joint swelling  GASTROINTEST:  []  vomiting blood, []  blood in stool     GENITOURINARY:  []  burning with urination, []  blood in urine  PSYCHIATRIC:  []  history of major  depression  INTEGUMENTARY:  []  rashes, []  ulcers  CONSTITUTIONAL:  []  fever, []  chills   Physical Examination  Filed Vitals:   01/14/13 1416 01/14/13 1417  BP: 157/69 157/87  Pulse: 62 67  Resp: 18   Height: 5\' 11"  (1.803 m)   Weight: 223 lb (101.152 kg)    Body mass index is 31.12 kg/(m^2).  General: A&O x 3, WDWN  Head: [place]/AT  Ear/Nose/Throat: Hearing grossly intact, nares w/o erythema or drainage, oropharynx w/o Erythema/Exudate, Mallampati score: 3   Eyes: PERRLA, EOMI  Neck: Supple, no nuchal rigidity, no palpable LAD  Pulmonary: Sym exp, good air movt, CTAB, no rales, rhonchi, & wheezing  Cardiac: RRR, Nl S1, S2, no Murmurs, rubs or gallops  Vascular: Vessel Right Left  Radial Palpable Palpable  Brachial Palpable Palpable  Carotid Palpable, without bruit Palpable, without bruit  Aorta  Not palpable N/A  Femoral Palpable Palpable  Popliteal Not palpable Not palpable  PT Palpable Palpable  DP Palpable Palpable   Gastrointestinal: soft, NTND, -G/R, - HSM, - masses, - CVAT B  Musculoskeletal: M/S 5/5 throughout , Extremities without ischemic changes   Neurologic: CN 2-12 intact , Pain and light touch intact in extremities , Motor exam as listed above  Psychiatric: Judgment intact, Mood & affect appropriate for pt's clinical situation  Dermatologic: See M/S exam for extremity exam, no rashes otherwise noted  Lymph : No Cervical, Axillary, or Inguinal lymphadenopathy   Non-Invasive Vascular Imaging  CAROTID DUPLEX (Date: 01/14/2013):   R ICA stenosis: 40-59%  R VA: patent and antegrade  L ICA stenosis: 40-59%  L VA: patent and antegrade  Outside Studies/Documentation 5 pages of outside documents were reviewed including: outpatient clinic chart, echo report, and outside carotid duplex study.  Medical Decision Making  Jerry Ferguson is a 68 y.o. male who presents with: asx B ICA stenosis 40-59%, h/o near syncope unlikely related to vertebral or  carotid disease   Patient sx are unlikely related to carotid disease in this case.  There is also no evidence that the vertebral arteries are significantly occluded in this case.  Based on the patient's vascular studies and examination, I have offered the patient: repeat B carotid duplex in 6 months.  The two sets of carotid studies do not agree.  As neither meets the 80% threshold anyway, I would just repeat the study in 6 months rather than obtaining another study like a CTA neck.  I discussed in depth with the patient the nature of atherosclerosis, and emphasized the importance of maximal medical management including strict control of  blood pressure, blood glucose, and lipid levels, obtaining regular exercise, antiplatelet agents, and cessation of smoking.   The patient is currently on a statin: Lipitor.  The patient is currently on an anti-platelet: ASA.    The patient is aware that without maximal medical management the underlying atherosclerotic disease process will progress, limiting the benefit of any interventions.  Thank you for allowing [place] to participate in this patient's care.  Jerry Sake, MD [facility] Office: [phone] Pager: [phone]  01/14/2013, 5:42 PM

## 2013-03-18 ENCOUNTER — Telehealth: Payer: Self-pay | Admitting: Pulmonary Disease

## 2013-03-22 NOTE — Telephone Encounter (Signed)
Returning call.

## 2013-03-22 NOTE — Telephone Encounter (Signed)
Called and spoke with pt and he is aware of SN no longer seeing primary care pts.  Pt requested to be set up with [facility] office---appt scheduled for 4/29 at 2:30 with Malva Coganody Martin.  Pt is aware of appt and he has the number in case he needs to contact them.

## 2013-03-22 NOTE — Telephone Encounter (Signed)
lmomtcb x 1  For pt

## 2013-05-02 ENCOUNTER — Ambulatory Visit: Payer: BC Managed Care – PPO | Admitting: Pulmonary Disease

## 2013-05-04 ENCOUNTER — Ambulatory Visit (INDEPENDENT_AMBULATORY_CARE_PROVIDER_SITE_OTHER): Payer: BC Managed Care – PPO | Admitting: Physician Assistant

## 2013-05-04 ENCOUNTER — Encounter: Payer: Self-pay | Admitting: Physician Assistant

## 2013-05-04 VITALS — BP 142/86 | HR 70 | Temp 97.8°F | Resp 16 | Ht 71.0 in | Wt 227.2 lb

## 2013-05-04 DIAGNOSIS — Z7689 Persons encountering health services in other specified circumstances: Secondary | ICD-10-CM

## 2013-05-04 DIAGNOSIS — Z23 Encounter for immunization: Secondary | ICD-10-CM

## 2013-05-04 DIAGNOSIS — K219 Gastro-esophageal reflux disease without esophagitis: Secondary | ICD-10-CM

## 2013-05-04 DIAGNOSIS — I10 Essential (primary) hypertension: Secondary | ICD-10-CM

## 2013-05-04 DIAGNOSIS — E875 Hyperkalemia: Secondary | ICD-10-CM

## 2013-05-04 DIAGNOSIS — E7800 Pure hypercholesterolemia, unspecified: Secondary | ICD-10-CM

## 2013-05-04 DIAGNOSIS — Z800 Family history of malignant neoplasm of digestive organs: Secondary | ICD-10-CM

## 2013-05-04 LAB — BASIC METABOLIC PANEL
BUN: 13 mg/dL (ref 6–23)
CHLORIDE: 105 meq/L (ref 96–112)
CO2: 27 mEq/L (ref 19–32)
CREATININE: 1.02 mg/dL (ref 0.50–1.35)
Calcium: 9.3 mg/dL (ref 8.4–10.5)
GLUCOSE: 80 mg/dL (ref 70–99)
POTASSIUM: 4.4 meq/L (ref 3.5–5.3)
Sodium: 144 mEq/L (ref 135–145)

## 2013-05-04 NOTE — Progress Notes (Signed)
Pre visit review using our clinic review tool, if applicable. No additional management support is needed unless otherwise documented below in the visit note/SLS  

## 2013-05-04 NOTE — Patient Instructions (Signed)
Continue medications as directed.  Please obtain labs to recheck potassium level.  I will call you with your result.  You will be contacted by Gastroenterology for a Colonoscopy.  This is very important.  Please return to clinic in 6 months for annual exam.  Return sooner as needed.

## 2013-05-04 NOTE — Progress Notes (Signed)
Patient presents to clinic today to establish care.   Chronic Issues: Hyperlipidemia -- controlled with Lipitor 40 mg.  Denies myalgias.    Allergic Rhinitis -- controlled with daily Flonase.  GERD -- controlled with Nexium.  Hypertension -- Patient currently on metoprolol. Has not taken medication. BP 142/86 today.  Denies chest pain, palpitations, lightheadedness  Health Maintenance: Dental -- Overdue.  Vision -- UTD Immunizations -- Overdue for Tetanus.  Overdue for Pneumonia vaccination. Colonoscopy -- + Family Hx of colon cancer.  Sister diagnosed at age 68.    Past Medical History  Diagnosis Date  . Chronic rhinitis   . Acute bronchitis   . Chest pain   . Cardiac murmur   . Hypercholesteremia   . Overweight   . GERD (gastroesophageal reflux disease)   . Abnormal liver function test   . History of UTI   . DJD (degenerative joint disease)   . Lumbar back pain   . Carotid artery occlusion     Bilateral Bruit  . Hypertension   . Aortic valve disorders   . Allergy   . Chicken pox   . Broken ankle     Right    Past Surgical History  Procedure Laterality Date  . Appendectomy      Current Outpatient Prescriptions on File Prior to Visit  Medication Sig Dispense Refill  . aspirin 81 MG tablet Take 81 mg by mouth daily.        Marland Kitchen. atorvastatin (LIPITOR) 40 MG tablet Take 1 tablet (40 mg total) by mouth daily.  30 tablet  6  . fluticasone (FLONASE) 50 MCG/ACT nasal spray Place 2 sprays into the nose as needed.  16 g  11  . metoprolol tartrate (LOPRESSOR) 25 MG tablet Take 1/2 tablet twice a day  30 tablet  6  . Multiple Vitamins-Minerals (CENTRUM SILVER PO) Take 1 tablet by mouth daily.      Marland Kitchen. NEXIUM 40 MG capsule TAKE 1 CAPSULE BY MOUTH DAILY BEFORE BREAKFAST  30 capsule  11  . sodium-potassium bicarbonate (ALKA-SELTZER GOLD) TBEF dissolvable tablet Take 1 tablet by mouth daily as needed.       No current facility-administered medications on file prior to visit.     No Known Allergies  Family History  Problem Relation Age of Onset  . Prostate cancer Brother   . Colon cancer Father     metastatic  . Other Father     DJD  . Arthritis Father [phone]    Deceased  . Healthy Mother     Living-87  . Heart attack Paternal Grandfather   . Heart disease Paternal Grandfather   . Cancer Paternal Uncle   . Colon cancer Sister   . Hypertension Daughter     x1  . Healthy Daughter     x1   History   Social History  . Marital Status: Married    Spouse Name: stacey(deceased 2011)    Number of Children: 1  . Years of Education: N/A   Occupational History  . welder    Social History Main Topics  . Smoking status: Former Smoker -- 1.00 packs/day for 15 years    Types: Cigarettes    Quit date: 01/06/1989  . Smokeless tobacco: Never Used     Comment: chewed some in early 20's  . Alcohol Use: No  . Drug Use: No  . Sexual Activity: Not on file   Other Topics Concern  . Not on file   Social History Narrative  Uncle with prostate cancer?   ROS See HPI.  All other ROS are negative.  BP 142/86  Pulse 70  Temp(Src) 97.8 F (36.6 C) (Oral)  Resp 16  Ht 5\' 11"  (1.803 m)  Wt 227 lb 4 oz (103.08 kg)  BMI 31.71 kg/m2  SpO2 98%  Physical Exam  Vitals reviewed. Constitutional: He is oriented to person, place, and time and well-developed, well-nourished, and in no distress.  HENT:  Head: Normocephalic and atraumatic.  Right Ear: External ear normal.  Left Ear: External ear normal.  Nose: Nose normal.  Mouth/Throat: Oropharynx is clear and moist. No oropharyngeal exudate.  TM within normal limits bilaterally.  Eyes: Conjunctivae and EOM are normal. Pupils are equal, round, and reactive to light.  Neck: Neck supple.  Cardiovascular: Normal rate, regular rhythm, normal heart sounds and intact distal pulses.   Pulmonary/Chest: Effort normal and breath sounds normal. No respiratory distress. He has no wheezes. He has no rales. He exhibits no  tenderness.  Lymphadenopathy:    He has no cervical adenopathy.  Neurological: He is alert and oriented to person, place, and time. No cranial nerve deficit.  Skin: Skin is warm and dry. No rash noted.  Psychiatric: Affect normal.   Assessment/Plan: Essential hypertension Continue current regimen.  DASH diet handout given.   GERD Continue Nexium daily.  Avoid late-night eating.  Elevate HOB.   Family history of colon cancer + family Hx of colorectal cancer.  Sister was recently diagnosed at age 68. Referral placed to GI for colonoscopy.  HYPERCHOLESTEROLEMIA Continue current regimen. Patient to return for CPE and fasting labs.  Encounter to establish care History reviewed and updated.  DTP and Prevnar given to patient by nursing.  Referral to GI placed for screening colonoscopy.  Patient with previous elevated potassium level.  Will recheck BMP.  Patient to return when due for CPE and fasting labs.

## 2013-05-09 DIAGNOSIS — Z7689 Persons encountering health services in other specified circumstances: Secondary | ICD-10-CM | POA: Insufficient documentation

## 2013-05-09 NOTE — Assessment & Plan Note (Addendum)
Continue current regimen. Patient to return for CPE and fasting labs.

## 2013-05-09 NOTE — Assessment & Plan Note (Signed)
Continue current regimen.  DASH diet handout given.

## 2013-05-09 NOTE — Assessment & Plan Note (Signed)
History reviewed and updated.  DTP and Prevnar given to patient by nursing.  Referral to GI placed for screening colonoscopy.  Patient with previous elevated potassium level.  Will recheck BMP.  Patient to return when due for CPE and fasting labs.

## 2013-05-09 NOTE — Assessment & Plan Note (Signed)
Continue Nexium daily.  Avoid late-night eating.  Elevate HOB.

## 2013-05-09 NOTE — Assessment & Plan Note (Signed)
+   family Hx of colorectal cancer.  Sister was recently diagnosed at age 68. Referral placed to GI for colonoscopy.

## 2013-05-11 ENCOUNTER — Encounter: Payer: Self-pay | Admitting: Gastroenterology

## 2013-05-16 ENCOUNTER — Encounter: Payer: Self-pay | Admitting: *Deleted

## 2013-05-19 ENCOUNTER — Ambulatory Visit (INDEPENDENT_AMBULATORY_CARE_PROVIDER_SITE_OTHER): Payer: BC Managed Care – PPO | Admitting: Cardiology

## 2013-05-19 ENCOUNTER — Encounter: Payer: Self-pay | Admitting: Cardiology

## 2013-05-19 VITALS — BP 132/80 | HR 76 | Ht 71.0 in | Wt 225.0 lb

## 2013-05-19 DIAGNOSIS — R079 Chest pain, unspecified: Secondary | ICD-10-CM

## 2013-05-19 DIAGNOSIS — I10 Essential (primary) hypertension: Secondary | ICD-10-CM

## 2013-05-19 DIAGNOSIS — R0989 Other specified symptoms and signs involving the circulatory and respiratory systems: Secondary | ICD-10-CM

## 2013-05-19 DIAGNOSIS — I739 Peripheral vascular disease, unspecified: Principal | ICD-10-CM

## 2013-05-19 DIAGNOSIS — I6529 Occlusion and stenosis of unspecified carotid artery: Secondary | ICD-10-CM

## 2013-05-19 DIAGNOSIS — I359 Nonrheumatic aortic valve disorder, unspecified: Secondary | ICD-10-CM

## 2013-05-19 DIAGNOSIS — Z8639 Personal history of other endocrine, nutritional and metabolic disease: Secondary | ICD-10-CM

## 2013-05-19 DIAGNOSIS — I779 Disorder of arteries and arterioles, unspecified: Secondary | ICD-10-CM

## 2013-05-19 DIAGNOSIS — Z862 Personal history of diseases of the blood and blood-forming organs and certain disorders involving the immune mechanism: Secondary | ICD-10-CM

## 2013-05-19 DIAGNOSIS — I350 Nonrheumatic aortic (valve) stenosis: Secondary | ICD-10-CM

## 2013-05-19 MED ORDER — LISINOPRIL 5 MG PO TABS: 5.0000 mg | ORAL_TABLET | Freq: Every day | ORAL | Status: DC

## 2013-05-19 MED ORDER — ATORVASTATIN CALCIUM 20 MG PO TABS: 20.0000 mg | ORAL_TABLET | Freq: Every day | ORAL | Status: DC

## 2013-05-19 NOTE — Progress Notes (Signed)
Patient ID: Jerry Ferguson, male   DOB: [birth year], 68 y.o.   MRN: [id number]    Patient Name: Jerry Ferguson Date of Encounter: 05/19/2013  Primary Care Provider:  Piedad Climes, PA-C Primary Cardiologist:  Lars Masson  Problem List   Past Medical History  Diagnosis Date  . Chronic rhinitis   . Acute bronchitis   . Chest pain   . Cardiac murmur   . Hypercholesteremia   . Overweight   . GERD (gastroesophageal reflux disease)   . Abnormal liver function test   . History of UTI   . DJD (degenerative joint disease)   . Lumbar back pain   . Carotid artery occlusion     Bilateral Bruit  . Hypertension   . Aortic valve disorders   . Allergy   . Chicken pox   . Broken ankle     Right   Past Surgical History  Procedure Laterality Date  . Appendectomy     Allergies  No Known Allergies  HPI  68 year old male with h/o hyperlipidemia who experienced 2 episodes of near syncope in the last summer. He underwent Duplex of carotids that showed moderate to severe heterogenous plague bilateraly in the bifurcation extending into both ICAs with associated 60-79% stenosis.  The patient has h/o smoking. He has never been treated for hypertension. He denies any chest pain SOB or palpitations. He still works at the [employer].The episodes of presyncope are quite concerning for him. He denies orthopnea, PND or lower extremity edema.  The patient was seen by Dr. Leonides Sake in January of 2015. Carotid ultrasound was repeated at the office showing 40-59 bilateral internal carotid artery stenosis. He was advised to follow in 6 months with repeat carotid ultrasound. He has this appointment coming up in July of 2015. Since the last visit he hasn't had any more episodes of presyncope or dizziness. He denies any chest pain or shortness of breath. He just feels that overall he feels more tired and has some mild lower extremity edema and stiffness at the base of both of  his toes. No redness or swelling noticed.  Home Medications  Prior to  hyperlipidemia,Admission medications   Medication Sig Start Date End Date Taking? Authorizing Provider  aspirin 81 MG tablet Take 81 mg by mouth daily.      Historical Provider, MD  atorvastatin (LIPITOR) 20 MG tablet TAKE 1 TABLET (20 MG TOTAL) BY MOUTH DAILY. 09/10/12   Michele Mcalpine, MD  fluticasone (FLONASE) 50 MCG/ACT nasal spray Place 2 sprays into the nose as needed. 11/01/12   Michele Mcalpine, MD  NEXIUM 40 MG capsule TAKE 1 CAPSULE BY MOUTH DAILY BEFORE BREAKFAST 09/10/12   Michele Mcalpine, MD    Family History  Family History  Problem Relation Age of Onset  . Prostate cancer Brother   . Colon cancer Father     metastatic  . Other Father     DJD  . Arthritis Father 31    Deceased  . Healthy Mother     Living-87  . Heart attack Paternal Grandfather   . Heart disease Paternal Grandfather   . Cancer Paternal Uncle   . Colon cancer Sister   . Hypertension Daughter     x1  . Healthy Daughter     x1  .       Social History  History   Social History  . Marital Status: Married    Spouse Name: stacey(deceased 04-Jun-2009)  Number of Children: 1  . Years of Education: N/A   Occupational History  . welder    Social History Main Topics  . Smoking status: Former Smoker -- 1.00 packs/day for 15 years    Types: Cigarettes    Quit date: 01/06/1989  . Smokeless tobacco: Never Used     Comment: chewed some in early 20's  . Alcohol Use: No  . Drug Use: No  . Sexual Activity: Not on file   Other Topics Concern  . Not on file   Social History Narrative   Uncle with prostate cancer?     Review of Systems, as per HPI, otherwise negative General:  No chills, fever, night sweats or weight changes.  Cardiovascular:  No chest pain, dyspnea on exertion, edema, orthopnea, palpitations, paroxysmal nocturnal dyspnea. Dermatological: No rash, lesions/masses Respiratory: No cough, dyspnea Urologic: No hematuria,  dysuria Abdominal:   No nausea, vomiting, diarrhea, bright red blood per rectum, melena, or hematemesis Neurologic:  No visual changes, wkns, changes in mental status. All other systems reviewed and are otherwise negative except as noted above.  Physical Exam  BP 142/90, HR 71 General: Pleasant, NAD Psych: Normal affect. Neuro: Alert and oriented X 3. Moves all extremities spontaneously. HEENT: Normal  Neck: Supple without bruits or JVD. Lungs:  Resp regular and unlabored, CTA. Heart: RRR no s3, s4, holosystolic murmur, present S2. Abdomen: Soft, non-tender, non-distended, BS + x 4.  Extremities: No clubbing, cyanosis or edema. DP/PT/Radials 2+ and equal bilaterally.  Labs:  No results found for this basename: CKTOTAL, CKMB, TROPONINI,  in the last 72 hours Lab Results  Component Value Date   WBC 6.4 11/04/2012   HGB 14.2 11/04/2012   HCT 41.2 11/04/2012   MCV 85.2 11/04/2012   PLT 178.0 11/04/2012   No results found for this basename: NA, K, CL, CO2, BUN, CREATININE, CALCIUM, LABALBU, PROT, BILITOT, ALKPHOS, ALT, AST, GLUCOSE,  in the last 168 hours Lab Results  Component Value Date   CHOL 142 11/04/2012   HDL 39.70 11/04/2012   LDLCALC 85 11/04/2012   TRIG 86.0 11/04/2012   No results found for this basename: DDIMER   No components found with this basename: POCBNP,   Accessory Clinical Findings  echocardiogram  ECG - sinus rhythm, 71 beats per minute, T wave abnormality, consider inferior ischemia, abnormal EKG  TTE 11/16/2012 Left ventricle: The cavity size was normal. Wall thickness was increased in a pattern of mild LVH. Systolic function was normal. The estimated ejection fraction was in the range of 55% to 60%. Wall motion was normal; there were no regional wall motion abnormalities. Doppler parameters are consistent with abnormal left ventricular relaxation (grade 1 diastolic  dysfunction). ------------------------------------------------------------ Aortic valve: Trileaflet; moderately calcified leaflets. Valve mobility was restricted. Doppler: There was mild to moderate stenosis. Trivial regurgitation. VTI ratio of LVOT to aortic valve: 0.42. Valve area: 1.45cm^2(VTI). Indexed valve area: 0.66cm^2/m^2 (VTI). Peak velocity ratio of LVOT to aortic valve: 0.41. Valve area: 1.41cm^2 (Vmax). Indexed valve area: 0.64cm^2/m^2 (Vmax). Mean gradient: 19mm Hg (S). Peak gradient: 32mm Hg (S). ------------------------------------------------------------ Aorta: Aortic root: The aortic root was normal in size. ------------------------------------------------------------ Mitral valve: Calcified annulus. Mobility was not restricted. Doppler: Transvalvular velocity was within the normal range. There was no evidence for stenosis. Trivial regurgitation. ------------------------------------------------------------ Left atrium: The atrium was mildly dilated. ------------------------------------------------------------ Atrial septum: There was an atrial septal aneurysm. ------------------------------------------------------------ Right ventricle: The cavity size was normal. Systolic function was normal. ------------------------------------------------------------ Pulmonic valve: Doppler: Transvalvular velocity was within  the normal range. There was no evidence for stenosis. ------------------------------------------------------------ Tricuspid valve: Structurally normal valve. Doppler: Transvalvular velocity was within the normal range. Trivial regurgitation. ------------------------------------------------------------ Pulmonary artery: Systolic pressure was within the normal range. ------------------------------------------------------------ Right atrium: The atrium was normal in size. ------------------------------------------------------------ Pericardium: There was no  pericardial effusion.     Assessment & Plan  68 year old male  1. Bilateral ICA stenosis 60-79%, reevaluated at vascular surgery office add 40-59%, a followup scheduled for July 20 years with repeat carotid ultrasound.   2. Hypertension - we will discontinue metoprolol after his concern for fatigue, we will start lisinopril 5 mg daily and check creatinine in 3 weeks from now. We'll avoid hydrochlorothiazide as patient possibly has gouty arthritis.  3. Mild to moderate aortic stenosis in a trileaflet valve - asymptomatic, we will repeat the echo before the next visit.  4. Hyperlipidemia - lipid panel at goal, we will decrease Lipitor Bextra 20 mg daily as the patient is concerned about muscle pain as a consequence of increased Lipitor dose. We will check CMP in 3 weeks.  Followup in 4 months.  Lars MassonKatarina H Catha Ontko, MD, [facility] 05/19/2013, 2:25 PM

## 2013-05-19 NOTE — Patient Instructions (Addendum)
Your physician has recommended you make the following change in your medication:   STOP TAKING METOPROLOL NOW  START TAKING LISINOPRIL 5 MG DAILY  DECREASE YOUR LIPITOR TO 20 MG DAILY   Your physician recommends that you return for lab work in: IN 3 WEEKS (CMET)  Your physician has requested that you have an echocardiogram. Echocardiography is a painless test that uses sound waves to create images of your heart. It provides your doctor with information about the size and shape of your heart and how well your heart's chambers and valves are working. This procedure takes approximately one hour. There are no restrictions for this procedure. HAVE THIS PRIOR TO YOUR 4 MONTH FOLLOW-UP APPOINTMENT  Your physician recommends that you schedule a follow-up appointment in: 4 MONTHS WITH DR Delton SeeNELSON

## 2013-06-07 ENCOUNTER — Ambulatory Visit (HOSPITAL_COMMUNITY): Payer: BC Managed Care – PPO | Attending: Cardiology | Admitting: Cardiology

## 2013-06-07 ENCOUNTER — Other Ambulatory Visit (INDEPENDENT_AMBULATORY_CARE_PROVIDER_SITE_OTHER): Payer: BC Managed Care – PPO

## 2013-06-07 DIAGNOSIS — I779 Disorder of arteries and arterioles, unspecified: Secondary | ICD-10-CM

## 2013-06-07 DIAGNOSIS — Z862 Personal history of diseases of the blood and blood-forming organs and certain disorders involving the immune mechanism: Secondary | ICD-10-CM

## 2013-06-07 DIAGNOSIS — I350 Nonrheumatic aortic (valve) stenosis: Secondary | ICD-10-CM

## 2013-06-07 DIAGNOSIS — R0989 Other specified symptoms and signs involving the circulatory and respiratory systems: Secondary | ICD-10-CM

## 2013-06-07 DIAGNOSIS — I359 Nonrheumatic aortic valve disorder, unspecified: Secondary | ICD-10-CM

## 2013-06-07 DIAGNOSIS — Z8639 Personal history of other endocrine, nutritional and metabolic disease: Secondary | ICD-10-CM

## 2013-06-07 DIAGNOSIS — I6529 Occlusion and stenosis of unspecified carotid artery: Secondary | ICD-10-CM

## 2013-06-07 DIAGNOSIS — Z87891 Personal history of nicotine dependence: Secondary | ICD-10-CM | POA: Insufficient documentation

## 2013-06-07 DIAGNOSIS — I10 Essential (primary) hypertension: Secondary | ICD-10-CM

## 2013-06-07 DIAGNOSIS — R079 Chest pain, unspecified: Secondary | ICD-10-CM

## 2013-06-07 DIAGNOSIS — I739 Peripheral vascular disease, unspecified: Secondary | ICD-10-CM

## 2013-06-07 DIAGNOSIS — E785 Hyperlipidemia, unspecified: Secondary | ICD-10-CM | POA: Insufficient documentation

## 2013-06-07 LAB — COMPREHENSIVE METABOLIC PANEL
ALT: 36 U/L (ref 0–53)
AST: 22 U/L (ref 0–37)
Albumin: 4.3 g/dL (ref 3.5–5.2)
Alkaline Phosphatase: 55 U/L (ref 39–117)
BUN: 15 mg/dL (ref 6–23)
CO2: 28 mEq/L (ref 19–32)
Calcium: 9.3 mg/dL (ref 8.4–10.5)
Chloride: 104 mEq/L (ref 96–112)
Creatinine, Ser: 1.2 mg/dL (ref 0.4–1.5)
GFR: 66.6 mL/min (ref >60.00)
Glucose, Bld: 102 mg/dL — ABNORMAL HIGH (ref 70–99)
Potassium: 4.1 mEq/L (ref 3.5–5.1)
Sodium: 138 mEq/L (ref 135–145)
Total Bilirubin: 1.3 mg/dL — ABNORMAL HIGH (ref 0.2–1.2)
Total Protein: 7.1 g/dL (ref 6.0–8.3)

## 2013-06-07 NOTE — Progress Notes (Signed)
Echo performed. 

## 2013-07-11 ENCOUNTER — Ambulatory Visit (AMBULATORY_SURGERY_CENTER): Payer: BC Managed Care – PPO | Admitting: *Deleted

## 2013-07-11 VITALS — Ht 71.0 in | Wt 217.2 lb

## 2013-07-11 DIAGNOSIS — Z800 Family history of malignant neoplasm of digestive organs: Secondary | ICD-10-CM

## 2013-07-11 MED ORDER — MOVIPREP 100 G PO SOLR: ORAL | Status: DC

## 2013-07-11 NOTE — Progress Notes (Signed)
No egg or soy allergy  No anesthesia or intubation problems per pt  No diet medications taken  Registered in EMMI   

## 2013-07-12 ENCOUNTER — Encounter: Payer: Self-pay | Admitting: Gastroenterology

## 2013-07-14 ENCOUNTER — Encounter: Payer: Self-pay | Admitting: Vascular Surgery

## 2013-07-15 ENCOUNTER — Ambulatory Visit (HOSPITAL_COMMUNITY)
Admission: RE | Admit: 2013-07-15 | Discharge: 2013-07-15 | Disposition: A | Discharge status: Home / Self Care | Payer: BC Managed Care – PPO | Source: Ambulatory Visit | Attending: Vascular Surgery | Admitting: Vascular Surgery

## 2013-07-15 ENCOUNTER — Other Ambulatory Visit: Payer: Self-pay | Admitting: Vascular Surgery

## 2013-07-15 ENCOUNTER — Encounter: Payer: Self-pay | Admitting: Vascular Surgery

## 2013-07-15 ENCOUNTER — Ambulatory Visit (INDEPENDENT_AMBULATORY_CARE_PROVIDER_SITE_OTHER): Payer: BC Managed Care – PPO | Admitting: Vascular Surgery

## 2013-07-15 VITALS — BP 148/74 | HR 60 | Ht 71.0 in | Wt 217.0 lb

## 2013-07-15 DIAGNOSIS — I739 Peripheral vascular disease, unspecified: Secondary | ICD-10-CM

## 2013-07-15 DIAGNOSIS — I6529 Occlusion and stenosis of unspecified carotid artery: Secondary | ICD-10-CM

## 2013-07-15 DIAGNOSIS — I779 Disorder of arteries and arterioles, unspecified: Secondary | ICD-10-CM | POA: Insufficient documentation

## 2013-07-15 DIAGNOSIS — I6523 Occlusion and stenosis of bilateral carotid arteries: Secondary | ICD-10-CM

## 2013-07-15 NOTE — Progress Notes (Signed)
    Established Carotid Patient  History of Present Illness  Jerry Ferguson is a 68 y.o. (11/22/1945) male who presents with chief complaint: routine follow up.  Previous carotid studies demonstrated: RICA 40-59% stenosis, LICA 40-59% stenosis.  Patient has no history of TIA or stroke symptom.  The patient has never had amaurosis fugax or monocular blindness.  The patient has never had facial drooping or hemiplegia.  The patient has never had receptive or expressive aphasia.    The patient's PMH, PSH, SH, FamHx, Med, and Allergies are unchanged from 01/14/13.  On ROS today: no further syncope, no CVA sx  Physical Examination  Filed Vitals:   07/15/13 1356 07/15/13 1400  BP: 138/79 148/74  Pulse: 60   Height: 5\' 11"  (1.803 m)   Weight: 217 lb (98.431 kg)   SpO2: 100%    Body mass index is 30.28 kg/(m^2).  General: A&O x 3, WDWN  Eyes: PERRLA, EOMI  Neck: Supple, no nuchal rigidity, no palpable LAD  Pulmonary: Sym exp, good air movt, CTAB, no rales, rhonchi, & wheezing  Cardiac: RRR, Nl S1, S2, no Murmurs, rubs or gallops  Vascular: Vessel Right Left  Radial Palpable Palpable  Brachial Palpable Palpable  Carotid Palpable, without bruit Palpable, without bruit  Aorta Not palpable N/A  Femoral Palpable Palpable  Popliteal Not palpable Not palpable  PT Palpable Palpable  DP Palpable Palpable   Gastrointestinal: soft, NTND, -G/R, - HSM, - masses, - CVAT B  Musculoskeletal: M/S 5/5 throughout , Extremities without ischemic changes   Neurologic: CN 2-12 intact , Pain and light touch intact in extremities , Motor exam as listed above  Non-Invasive Vascular Imaging  CAROTID DUPLEX (Date: 07/15/2013 ):   R ICA stenosis: 60-79%  R VA: patent and antegrade  L ICA stenosis: 40-59%  L VA: patent and antegrade  Medical Decision Making  Jerry LeanCarl T Summerson is a 68 y.o. male who presents with: R asx ICA stenosis 60-79%, L asx ICA stenosis 40-59%   Based on the patient's  vascular studies and examination, I have offered the patient: q6 month continue surveillance with B carotid duplex..  I discussed in depth with the patient the nature of atherosclerosis, and emphasized the importance of maximal medical management including strict control of blood pressure, blood glucose, and lipid levels, antiplatelet agents, obtaining regular exercise, and cessation of smoking.    The patient is aware that without maximal medical management the underlying atherosclerotic disease process will progress, limiting the benefit of any interventions. The patient is currently on a statin: lipitor. The patient is currently on an anti-platelet: ASA.  Thank you for allowing us to participate in this patient's care.  Leonides SakeBrian Chen, MD [facility] [place] Office: [phone] Pager: [phone]  07/15/2013, 2:13 PM

## 2013-07-22 ENCOUNTER — Ambulatory Visit (AMBULATORY_SURGERY_CENTER): Payer: BC Managed Care – PPO | Admitting: Gastroenterology

## 2013-07-22 ENCOUNTER — Encounter: Payer: Self-pay | Admitting: Gastroenterology

## 2013-07-22 VITALS — BP 144/85 | HR 59 | Temp 97.1°F | Resp 14 | Ht 71.0 in | Wt 217.0 lb

## 2013-07-22 DIAGNOSIS — Z1211 Encounter for screening for malignant neoplasm of colon: Secondary | ICD-10-CM

## 2013-07-22 DIAGNOSIS — Z800 Family history of malignant neoplasm of digestive organs: Secondary | ICD-10-CM

## 2013-07-22 MED ORDER — SODIUM CHLORIDE 0.9 % IV SOLN: 500.0000 mL | INTRAVENOUS | Status: DC

## 2013-07-22 NOTE — Progress Notes (Signed)
No complaints noted in the recovery room. Maw   

## 2013-07-22 NOTE — Patient Instructions (Signed)
YOU HAD AN ENDOSCOPIC PROCEDURE TODAY AT THE [facility]: Refer to the procedure report that was given to you for any specific questions about what was found during the examination.  If the procedure report does not answer your questions, please call your gastroenterologist to clarify.  If you requested that your care partner not be given the details of your procedure findings, then the procedure report has been included in a sealed envelope for you to review at your convenience later.  YOU SHOULD EXPECT: Some feelings of bloating in the abdomen. Passage of more gas than usual.  Walking can help get rid of the air that was put into your GI tract during the procedure and reduce the bloating. If you had a lower endoscopy (such as a colonoscopy or flexible sigmoidoscopy) you may notice spotting of blood in your stool or on the toilet paper. If you underwent a bowel prep for your procedure, then you may not have a normal bowel movement for a few days.  DIET: Your first meal following the procedure should be a light meal and then it is ok to progress to your normal diet.  A half-sandwich or bowl of soup is an example of a good first meal.  Heavy or fried foods are harder to digest and may make you feel nauseous or bloated.  Likewise meals heavy in dairy and vegetables can cause extra gas to form and this can also increase the bloating.  Drink plenty of fluids but you should avoid alcoholic beverages for 24 hours.  ACTIVITY: Your care partner should take you home directly after the procedure.  You should plan to take it easy, moving slowly for the rest of the day.  You can resume normal activity the day after the procedure however you should NOT DRIVE or use heavy machinery for 24 hours (because of the sedation medicines used during the test).    SYMPTOMS TO REPORT IMMEDIATELY: A gastroenterologist can be reached at any hour.  During normal business hours, 8:30 AM to 5:00 PM Monday through Friday,  call [phone].  After hours and on weekends, please call the GI answering service at [phone] who will take a message and have the physician on call contact you.   Following lower endoscopy (colonoscopy or flexible sigmoidoscopy):  Excessive amounts of blood in the stool  Significant tenderness or worsening of abdominal pains  Swelling of the abdomen that is new, acute  Fever of 100F or higher FOLLOW UP: If any biopsies were taken you will be contacted by phone or by letter within the next 1-3 weeks.  Call your gastroenterologist if you have not heard about the biopsies in 3 weeks.  Our staff will call the home number listed on your records the next business day following your procedure to check on you and address any questions or concerns that you may have at that time regarding the information given to you following your procedure. This is a courtesy call and so if there is no answer at the home number and we have not heard from you through the emergency physician on call, we will assume that you have returned to your regular daily activities without incident.  SIGNATURES/CONFIDENTIALITY: You and/or your care partner have signed paperwork which will be entered into your electronic medical record.  These signatures attest to the fact that that the information above on your After Visit Summary has been reviewed and is understood.  Full responsibility of the confidentiality of this discharge   information lies with you and/or your care-partner.    Handouts were given to your care partner on  a high fiber diet with liberal fluid intake and hemorrhoids. You may resume your current medications today. Please call if any questions or concerns.   

## 2013-07-22 NOTE — Op Note (Signed)
[facility] [address] [place], [id number]   COLONOSCOPY PROCEDURE REPORT  PATIENT: Jerry Ferguson, Jerry T.  MR#: [id number] BIRTHDATE: February 18, 1945 , 67  yrs. old GENDER: Male ENDOSCOPIST: Meryl DareMalcolm T Stark, MD, [facility] PROCEDURE DATE:  07/22/2013 PROCEDURE:   Colonoscopy, screening First Screening Colonoscopy - Avg.  risk and is 50 yrs.  old or older - No.  Prior Negative Screening - Now for repeat screening. N/A  History of Adenoma - Now for follow-up colonoscopy & has been > or = to 3 yrs.  N/A  Polyps Removed Today? No.  Recommend repeat exam, <10 yrs? Yes.  High risk (family or personal hx). ASA CLASS:   Class III INDICATIONS:elevated risk screening, Patient's immediate family history of colon cancer-father and sister. MEDICATIONS: MAC sedation, administered by CRNA and propofol (Diprivan) 260mg  IV DESCRIPTION OF PROCEDURE:   After the risks benefits and alternatives of the procedure were thoroughly explained, informed consent was obtained.  A digital rectal exam revealed no abnormalities of the rectum.   The LB [id number]  endoscope was introduced through the anus and advanced to the cecum, which was identified by both the appendix and ileocecal valve. No adverse events experienced.   The quality of the prep was excellent, using MoviPrep  The instrument was then slowly withdrawn as the colon was fully examined.  COLON FINDINGS: A normal appearing cecum, ileocecal valve, and appendiceal orifice were identified.  The ascending, hepatic flexure, transverse, splenic flexure, descending, sigmoid colon and rectum appeared unremarkable.  No polyps or cancers were seen. Retroflexed views revealed small internal hemorrhoids. The time to cecum=2 minutes 44 seconds.  Withdrawal time=13 minutes 00 seconds. The scope was withdrawn and the procedure completed.  COMPLICATIONS: There were no complications.  ENDOSCOPIC IMPRESSION: 1.  Normal colon 2.  Small internal  hemorrhoids  RECOMMENDATIONS: 1.  Repeat Colonoscopy in 5 years.  eSigned:  Meryl DareMalcolm T Stark, MD, [facility] 07/22/2013 9:30 AM

## 2013-07-22 NOTE — Progress Notes (Signed)
Report to PACU, RN, vss, BBS= Clear.  

## 2013-07-25 ENCOUNTER — Telehealth: Payer: Self-pay | Admitting: *Deleted

## 2013-07-25 NOTE — Telephone Encounter (Signed)
Message left

## 2013-09-16 ENCOUNTER — Encounter: Payer: Self-pay | Admitting: *Deleted

## 2013-09-19 ENCOUNTER — Ambulatory Visit: Payer: BC Managed Care – PPO | Admitting: Cardiology

## 2013-09-22 ENCOUNTER — Ambulatory Visit: Payer: BC Managed Care – PPO | Admitting: Cardiology

## 2013-09-25 ENCOUNTER — Other Ambulatory Visit: Payer: Self-pay | Admitting: Pulmonary Disease

## 2013-09-26 ENCOUNTER — Other Ambulatory Visit: Payer: Self-pay | Admitting: Physician Assistant

## 2013-11-04 ENCOUNTER — Encounter: Payer: Self-pay | Admitting: Physician Assistant

## 2013-11-04 ENCOUNTER — Ambulatory Visit (INDEPENDENT_AMBULATORY_CARE_PROVIDER_SITE_OTHER): Payer: BC Managed Care – PPO | Admitting: Physician Assistant

## 2013-11-04 VITALS — BP 122/74 | HR 64 | Temp 98.2°F | Resp 16 | Ht 71.0 in | Wt 216.1 lb

## 2013-11-04 DIAGNOSIS — E7800 Pure hypercholesterolemia, unspecified: Secondary | ICD-10-CM

## 2013-11-04 DIAGNOSIS — B9689 Other specified bacterial agents as the cause of diseases classified elsewhere: Secondary | ICD-10-CM | POA: Insufficient documentation

## 2013-11-04 DIAGNOSIS — Z125 Encounter for screening for malignant neoplasm of prostate: Secondary | ICD-10-CM | POA: Insufficient documentation

## 2013-11-04 DIAGNOSIS — J019 Acute sinusitis, unspecified: Secondary | ICD-10-CM

## 2013-11-04 DIAGNOSIS — Z0000 Encounter for general adult medical examination without abnormal findings: Secondary | ICD-10-CM | POA: Insufficient documentation

## 2013-11-04 DIAGNOSIS — Z136 Encounter for screening for cardiovascular disorders: Secondary | ICD-10-CM | POA: Insufficient documentation

## 2013-11-04 DIAGNOSIS — R350 Frequency of micturition: Secondary | ICD-10-CM

## 2013-11-04 DIAGNOSIS — I10 Essential (primary) hypertension: Secondary | ICD-10-CM

## 2013-11-04 LAB — LIPID PANEL W/DIRECT LDL/HDL RATIO
CHOLESTEROL - DIR LDL/HDL RATIO: 144 mg/dL (ref 0–200)
Direct LDL: 95 mg/dL
HDL: 44 mg/dL (ref >39)
LDL:HDL Ratio: 2.2 Ratio
Total Chol/HDL Ratio: 3.3 Ratio
Triglycerides: 88 mg/dL (ref <150)

## 2013-11-04 LAB — URINALYSIS, ROUTINE W REFLEX MICROSCOPIC
BILIRUBIN URINE: NEGATIVE
Hgb urine dipstick: NEGATIVE
KETONES UR: NEGATIVE
Leukocytes, UA: NEGATIVE
Nitrite: NEGATIVE
Specific Gravity, Urine: 1.015 (ref 1.000–1.030)
Total Protein, Urine: NEGATIVE
URINE GLUCOSE: NEGATIVE
UROBILINOGEN UA: 0.2 (ref 0.0–1.0)
pH: 6.5 (ref 5.0–8.0)

## 2013-11-04 LAB — BASIC METABOLIC PANEL WITH GFR
BUN: 17 mg/dL (ref 6–23)
CALCIUM: 9.4 mg/dL (ref 8.4–10.5)
CHLORIDE: 103 meq/L (ref 96–112)
CO2: 26 meq/L (ref 19–32)
CREATININE: 0.97 mg/dL (ref 0.50–1.35)
GFR, EST NON AFRICAN AMERICAN: 80 mL/min
GFR, Est African American: >89 mL/min
Glucose, Bld: 101 mg/dL — ABNORMAL HIGH (ref 70–99)
Potassium: 4.6 mEq/L (ref 3.5–5.3)
SODIUM: 137 meq/L (ref 135–145)

## 2013-11-04 LAB — HEMOGLOBIN A1C: HEMOGLOBIN A1C: 5.5 % (ref 4.6–6.5)

## 2013-11-04 LAB — HEPATIC FUNCTION PANEL
ALBUMIN: 3.9 g/dL (ref 3.5–5.2)
ALK PHOS: 66 U/L (ref 39–117)
ALT: 31 U/L (ref 0–53)
AST: 25 U/L (ref 0–37)
Bilirubin, Direct: 0.1 mg/dL (ref 0.0–0.3)
Total Bilirubin: 1.5 mg/dL — ABNORMAL HIGH (ref 0.2–1.2)
Total Protein: 7.5 g/dL (ref 6.0–8.3)

## 2013-11-04 LAB — CBC
HCT: 45.1 % (ref 39.0–52.0)
Hemoglobin: 14.8 g/dL (ref 13.0–17.0)
MCHC: 32.8 g/dL (ref 30.0–36.0)
MCV: 88.1 fl (ref 78.0–100.0)
Platelets: 259 10*3/uL (ref 150.0–400.0)
RBC: 5.12 Mil/uL (ref 4.22–5.81)
RDW: 13.8 % (ref 11.5–15.5)
WBC: 7.2 10*3/uL (ref 4.0–10.5)

## 2013-11-04 LAB — TSH: TSH: 1.48 u[IU]/mL (ref 0.35–4.50)

## 2013-11-04 MED ORDER — FLUTICASONE PROPIONATE 50 MCG/ACT NA SUSP: 2.0000 | NASAL | Status: DC | PRN

## 2013-11-04 MED ORDER — AZITHROMYCIN 250 MG PO TABS: ORAL_TABLET | ORAL | Status: DC

## 2013-11-04 NOTE — Assessment & Plan Note (Addendum)
Will obtain PSA.  DRE not performed as patient refused exam.

## 2013-11-04 NOTE — Assessment & Plan Note (Signed)
Well-controlled on current regimen.  Medications refilled.  Will check labs today. EKG reveals sinus rhythm.

## 2013-11-04 NOTE — Assessment & Plan Note (Signed)
EKG performed today revealing NSR.  Will resume 81 mg ASA daily.

## 2013-11-04 NOTE — Progress Notes (Signed)
Pre visit review using our clinic review tool, if applicable. No additional management support is needed unless otherwise documented below in the visit note/SLS  

## 2013-11-04 NOTE — Patient Instructions (Signed)
Please obtain labs.  I will call you with your results.  Continue medications as directed.   Start a [employer] supplement to help with urinary frequency.  We may add a prescription medication to help with this once results are in.  Please take antibiotic as directed.  Increase fluid intake.  Use Saline nasal spray.  Take a daily multivitamin.  Place a humidifier in the bedroom.  Please call or return clinic if symptoms are not improving.  Sinusitis Sinusitis is redness, soreness, and swelling (inflammation) of the paranasal sinuses. Paranasal sinuses are air pockets within the bones of your face (beneath the eyes, the middle of the forehead, or above the eyes). In healthy paranasal sinuses, mucus is able to drain out, and air is able to circulate through them by way of your nose. However, when your paranasal sinuses are inflamed, mucus and air can become trapped. This can allow bacteria and other germs to grow and cause infection. Sinusitis can develop quickly and last only a short time (acute) or continue over a long period (chronic). Sinusitis that lasts for more than 12 weeks is considered chronic.  CAUSES  Causes of sinusitis include:  Allergies.  Structural abnormalities, such as displacement of the cartilage that separates your nostrils (deviated septum), which can decrease the air flow through your nose and sinuses and affect sinus drainage.  Functional abnormalities, such as when the small hairs (cilia) that line your sinuses and help remove mucus do not work properly or are not present. SYMPTOMS  Symptoms of acute and chronic sinusitis are the same. The primary symptoms are pain and pressure around the affected sinuses. Other symptoms include:  Upper toothache.  Earache.  Headache.  Bad breath.  Decreased sense of smell and taste.  A cough, which worsens when you are lying flat.  Fatigue.  Fever.  Thick drainage from your nose, which often is green and may contain pus  (purulent).  Swelling and warmth over the affected sinuses. DIAGNOSIS  Your caregiver will perform a physical exam. During the exam, your caregiver may:  Look in your nose for signs of abnormal growths in your nostrils (nasal polyps).  Tap over the affected sinus to check for signs of infection.  View the inside of your sinuses (endoscopy) with a special imaging device with a light attached (endoscope), which is inserted into your sinuses. If your caregiver suspects that you have chronic sinusitis, one or more of the following tests may be recommended:  Allergy tests.  Nasal culture A sample of mucus is taken from your nose and sent to a lab and screened for bacteria.  Nasal cytology A sample of mucus is taken from your nose and examined by your caregiver to determine if your sinusitis is related to an allergy. TREATMENT  Most cases of acute sinusitis are related to a viral infection and will resolve on their own within 10 days. Sometimes medicines are prescribed to help relieve symptoms (pain medicine, decongestants, nasal steroid sprays, or saline sprays).  However, for sinusitis related to a bacterial infection, your caregiver will prescribe antibiotic medicines. These are medicines that will help kill the bacteria causing the infection.  Rarely, sinusitis is caused by a fungal infection. In theses cases, your caregiver will prescribe antifungal medicine. For some cases of chronic sinusitis, surgery is needed. Generally, these are cases in which sinusitis recurs more than 3 times per year, despite other treatments. HOME CARE INSTRUCTIONS   Drink plenty of water. Water helps thin the mucus so  your sinuses can drain more easily.  Use a humidifier.  Inhale steam 3 to 4 times a day (for example, sit in the bathroom with the shower running).  Apply a warm, moist washcloth to your face 3 to 4 times a day, or as directed by your caregiver.  Use saline nasal sprays to help moisten and  clean your sinuses.  Take over-the-counter or prescription medicines for pain, discomfort, or fever only as directed by your caregiver. SEEK IMMEDIATE MEDICAL CARE IF:  You have increasing pain or severe headaches.  You have nausea, vomiting, or drowsiness.  You have swelling around your face.  You have vision problems.  You have a stiff neck.  You have difficulty breathing. MAKE SURE YOU:   Understand these instructions.  Will watch your condition.  Will get help right away if you are not doing well or get worse. Document Released: 12/23/2004 Document Revised: 03/17/2011 Document Reviewed: 01/07/2011 [facility] Patient Information 2014 [place], [place].

## 2013-11-04 NOTE — Assessment & Plan Note (Signed)
Rx Z-Pack.  Increase fluids.  Rest.  Saline nasal spray.  Probiotic.  Mucinex as directed.  Humidifier in bedroom.  Resume Flonase. Medications refilled. Call or return to clinic if symptoms are not improving.

## 2013-11-04 NOTE — Assessment & Plan Note (Addendum)
History reviewed and updated. Patient up-to-date of vision/dental check ups. Immunizations discussed -- Influenza, Zostavax.  Patient sick presently.  Will return for immunizations once well.  Discussed prostate cancer screening today.  See A/P.  Will obtain fasting labs at today's visit.

## 2013-11-04 NOTE — Progress Notes (Signed)
Patient presents to clinic today for annual exam.  Patient is fasting for labs.  Acute Concerns: Patient complains of sinus pressure, sinus pain, ear pressure, ear pain x 2-3 weeks.  Denies fever, chills, SOB. Some mild, dry cough that is intermittent.  Endorses some local travel recently.  Denies sick contact.  Patient also notes some increased urinary frequency.  Denies dysuria, urgency, hesitancy, post-void dribbling.  Endorses nocturia 2 x nightly.  Denies history of BPH.  Chronic Issues: Hypertension -- Currently on Lisinopril 5 mg daily with good control of BP previously.  Denies chest pain, palpitations, lightheadedness, vision changes or frequent headaches.  BP 122/74 in clinic today.  Is also followed by Cardiology for Carotid Stenosis. Has upcoming appointment. Patient has stopped taking his 81 mg ASA since last visit.  Does not give reason for this.  Hyperlipidemia -- Currently on Lipitor 20 mg daily.  Denies myalgias.  Is due for repeat lipid panel and liver function testing.  GERD -- Endorses well-controlled with Nexium daily.   Health Maintenance: Dental -- up-to-date Vision -- up-to-date Immunizations -- Overdue for flu and Zostavax.  Patient is sick presently.  Will hold off until infection subsides. Colonoscopy -- Due in 2020.  Past Medical History  Diagnosis Date  . Chronic rhinitis   . Acute bronchitis   . Chest pain   . Cardiac murmur   . Hypercholesteremia   . Overweight(278.02)   . GERD (gastroesophageal reflux disease)   . Abnormal liver function test   . History of UTI   . DJD (degenerative joint disease)   . Lumbar back pain   . Carotid artery occlusion     Bilateral Bruit  . Hypertension   . Aortic valve disorders   . Allergy   . Chicken pox   . Broken ankle     Right  . Cataract     Past Surgical History  Procedure Laterality Date  . Appendectomy    . Colonoscopy      Current Outpatient Prescriptions on File Prior to Visit    Medication Sig Dispense Refill  . atorvastatin (LIPITOR) 20 MG tablet Take 1 tablet (20 mg total) by mouth daily.  90 tablet  3  . cyclobenzaprine (FLEXERIL) 10 MG tablet Take 10 mg by mouth 3 (three) times daily as needed for muscle spasms (Leg Cramps).      Marland Kitchen lisinopril (PRINIVIL,ZESTRIL) 5 MG tablet Take 1 tablet (5 mg total) by mouth daily.  90 tablet  3  . NEXIUM 40 MG capsule TAKE 1 CAPSULE BY MOUTH DAILY BEFORE BREAKFAST  30 capsule  10  . aspirin 81 MG tablet Take 81 mg by mouth as needed.        No current facility-administered medications on file prior to visit.    No Known Allergies  Family History  Problem Relation Age of Onset  . Prostate cancer Brother   . Colon cancer Father     metastatic  . Other Father     DJD  . Arthritis Father 84  . Healthy Mother     Living-87  . Heart attack Paternal Grandfather   . Heart disease Paternal Grandfather   . Cancer Paternal Uncle   . Colon cancer Sister   . Hypertension Daughter     x1  . Healthy Daughter     x1  . Esophageal cancer Neg Hx   . Stomach cancer Neg Hx   . Rectal cancer Neg Hx     History  Social History  . Marital Status: Married    Spouse Name: stacey(deceased 2011)    Number of Children: 1  . Years of Education: N/A   Occupational History  . welder    Social History Main Topics  . Smoking status: Former Smoker -- 1.00 packs/day for 15 years    Types: Cigarettes    Quit date: 01/06/1989  . Smokeless tobacco: Never Used     Comment: chewed some in early 20's  . Alcohol Use: No  . Drug Use: No  . Sexual Activity: Not on file   Other Topics Concern  . Not on file   Social History Narrative   Uncle with prostate cancer?   Review of Systems  Constitutional: Negative for fever, chills and weight loss.  HENT: Negative for ear pain, hearing loss and tinnitus.   Eyes: Negative for blurred vision and double vision.  Respiratory: Negative for cough and shortness of breath.   Cardiovascular:  Negative for chest pain and palpitations.  Gastrointestinal: Negative for heartburn, nausea, vomiting, abdominal pain, diarrhea, constipation, blood in stool and melena.  Genitourinary: Positive for frequency. Negative for dysuria, urgency, hematuria and flank pain.       Nocturia x 1-2.  Neurological: Negative for dizziness, tingling, tremors, sensory change, speech change, focal weakness, seizures, loss of consciousness and headaches.  Endo/Heme/Allergies: Negative for environmental allergies.  Psychiatric/Behavioral: Negative for depression, suicidal ideas, hallucinations and substance abuse. The patient is not nervous/anxious and does not have insomnia.    BP 122/74  Pulse 64  Temp(Src) 98.2 F (36.8 C) (Oral)  Resp 16  Ht 5\' 11"  (1.803 m)  Wt 216 lb 2 oz (98.034 kg)  BMI 30.16 kg/m2  SpO2 99%  Physical Exam  Vitals reviewed. Constitutional: He is oriented to person, place, and time and well-developed, well-nourished, and in no distress.  HENT:  Head: Normocephalic and atraumatic.  Right Ear: External ear normal.  Left Ear: External ear normal.  Nose: Nose normal.  Mouth/Throat: Oropharynx is clear and moist. No oropharyngeal exudate.  TM within normal limits bilaterally.  Eyes: Conjunctivae are normal. Pupils are equal, round, and reactive to light.  Neck: Neck supple.  Cardiovascular: Normal rate, regular rhythm, normal heart sounds and intact distal pulses.   Pulmonary/Chest: Effort normal and breath sounds normal. No respiratory distress. He has no wheezes. He has no rales. He exhibits no tenderness.  Abdominal: Soft. Bowel sounds are normal. He exhibits no distension and no mass. There is no tenderness. There is no rebound and no guarding.  Genitourinary:  Patient defers exam stating last PCP checked earlier this year and was normal  Lymphadenopathy:    He has no cervical adenopathy.  Neurological: He is alert and oriented to person, place, and time.  Skin: Skin is warm  and dry. No rash noted.  Psychiatric: Affect normal.   Assessment/Plan: Visit for preventive health examination History reviewed and updated. Patient up-to-date of vision/dental check ups. Immunizations discussed -- Influenza, Zostavax.  Patient sick presently.  Will return for immunizations once well.  Discussed prostate cancer screening today.  See A/P.  Will obtain fasting labs at today's visit.  HYPERCHOLESTEROLEMIA Previously well-controlled. Will obtain Lipid Panel and hepatic function testing today.  Continue current regimen.  Patient encouraged to restart 81 mg ASA daily.  Follow-up with Cardiology as directed.  Essential hypertension Well-controlled on current regimen.  Medications refilled.  Will check labs today. EKG reveals sinus rhythm.  Prostate cancer screening Will obtain PSA.  DRE not performed  as patient refused exam.   Screening for ischemic heart disease EKG performed today revealing NSR.  Will resume 81 mg ASA daily.  Acute bacterial sinusitis Rx Z-Pack.  Increase fluids.  Rest.  Saline nasal spray.  Probiotic.  Mucinex as directed.  Humidifier in bedroom.  Resume Flonase. Medications refilled. Call or return to clinic if symptoms are not improving.

## 2013-11-04 NOTE — Assessment & Plan Note (Signed)
Previously well-controlled. Will obtain Lipid Panel and hepatic function testing today.  Continue current regimen.  Patient encouraged to restart 81 mg ASA daily.  Follow-up with Cardiology as directed.

## 2013-11-05 LAB — PSA, TOTAL AND FREE
PSA FREE PCT: 41 % (ref >25)
PSA FREE: 0.97 ng/mL
PSA: 2.37 ng/mL (ref <=4.00)

## 2013-11-07 ENCOUNTER — Encounter: Payer: Self-pay | Admitting: *Deleted

## 2013-12-28 ENCOUNTER — Other Ambulatory Visit: Payer: Self-pay | Admitting: *Deleted

## 2013-12-28 DIAGNOSIS — I6523 Occlusion and stenosis of bilateral carotid arteries: Secondary | ICD-10-CM

## 2014-01-20 ENCOUNTER — Ambulatory Visit: Payer: BC Managed Care – PPO | Admitting: Family

## 2014-01-20 ENCOUNTER — Other Ambulatory Visit (HOSPITAL_COMMUNITY): Payer: BC Managed Care – PPO

## 2014-05-19 ENCOUNTER — Other Ambulatory Visit: Payer: Self-pay

## 2014-05-19 MED ORDER — ATORVASTATIN CALCIUM 20 MG PO TABS: 20.0000 mg | ORAL_TABLET | Freq: Every day | ORAL | Status: DC

## 2014-05-19 MED ORDER — LISINOPRIL 5 MG PO TABS: 5.0000 mg | ORAL_TABLET | Freq: Every day | ORAL | Status: DC

## 2014-08-16 ENCOUNTER — Other Ambulatory Visit: Payer: Self-pay | Admitting: Cardiology

## 2014-08-16 NOTE — Telephone Encounter (Signed)
Recent lipid/liver panels have been done by pcp. Please advise on refill. Thanks, MI

## 2014-08-16 NOTE — Telephone Encounter (Signed)
Will route this to Dr Delton See for further review and recommendation of refill.  If pts PCP drew labs, then he would probably need to advise on statin regimen. Pt has not been in the office to see Dr Delton See since 05/19/13.  Pt has cancelled 2 follow-up appts with Dr Delton See since then.

## 2014-09-18 ENCOUNTER — Other Ambulatory Visit: Payer: Self-pay | Admitting: Physician Assistant

## 2014-09-28 ENCOUNTER — Encounter: Payer: Self-pay | Admitting: Family Medicine

## 2014-09-28 ENCOUNTER — Ambulatory Visit (INDEPENDENT_AMBULATORY_CARE_PROVIDER_SITE_OTHER): Payer: BLUE CROSS/BLUE SHIELD | Admitting: Family Medicine

## 2014-09-28 VITALS — BP 134/84 | HR 76 | Temp 98.3°F | Resp 17 | Wt 216.5 lb

## 2014-09-28 DIAGNOSIS — J019 Acute sinusitis, unspecified: Secondary | ICD-10-CM | POA: Diagnosis not present

## 2014-09-28 DIAGNOSIS — B9689 Other specified bacterial agents as the cause of diseases classified elsewhere: Secondary | ICD-10-CM

## 2014-09-28 MED ORDER — FLUTICASONE PROPIONATE 50 MCG/ACT NA SUSP: 2.0000 | NASAL | Status: DC | PRN

## 2014-09-28 MED ORDER — AMOXICILLIN 875 MG PO TABS: 875.0000 mg | ORAL_TABLET | Freq: Two times a day (BID) | ORAL | Status: DC

## 2014-09-28 NOTE — Progress Notes (Signed)
   Subjective:    Patient ID: Jerry Ferguson, male    DOB: [birth year], 69 y.o.   MRN: [id number]  HPI 'i feel like i'm congested from my chest up'- sxs started 2-3 weeks ago.  + chest congestion, cough- intermittently productive.  + PND, head congestion, ear fullness.  No known fevers.  No known sick contacts.  + HA.  + eye pressure.  No tooth pain.  No N/V/D.   Review of Systems For ROS see HPI     Objective:   Physical Exam  Constitutional: He appears well-developed and well-nourished. No distress.  HENT:  Head: Normocephalic and atraumatic.  Right Ear: Tympanic membrane normal.  Left Ear: Tympanic membrane normal.  Nose: Mucosal edema and rhinorrhea present. Right sinus exhibits frontal sinus tenderness. Right sinus exhibits no maxillary sinus tenderness. Left sinus exhibits frontal sinus tenderness. Left sinus exhibits no maxillary sinus tenderness.  Mouth/Throat: Mucous membranes are normal. Oropharyngeal exudate and posterior oropharyngeal erythema present. No posterior oropharyngeal edema.  + PND  Eyes: Conjunctivae and EOM are normal. Pupils are equal, round, and reactive to light.  Neck: Normal range of motion. Neck supple.  Cardiovascular: Normal rate and regular rhythm.   Pulmonary/Chest: Effort normal and breath sounds normal. No respiratory distress. He has no wheezes.  No cough heard  Lymphadenopathy:    He has no cervical adenopathy.  Skin: Skin is warm and dry.  Vitals reviewed.         Assessment & Plan:

## 2014-09-28 NOTE — Patient Instructions (Signed)
Follow up as needed Start the Amoxicillin twice daily- take w/ food Drink plenty of fluids Restart the nasal spray- 2 sprays each nostril daily REST! Call with any questions or concerns- particularly if not improving Hang in there!!! Happy [birth year] Birthday!

## 2014-09-28 NOTE — Progress Notes (Signed)
Pre visit review using our clinic review tool, if applicable. No additional management support is needed unless otherwise documented below in the visit note. 

## 2014-09-29 NOTE — Assessment & Plan Note (Signed)
New to provider.  sxs and PE consistent w/ early sinus infxn.  Restart nasal steroid.  Start abx.  Reviewed supportive care and red flags that should prompt return.  Pt expressed understanding and is in agreement w/ plan.

## 2014-10-08 ENCOUNTER — Other Ambulatory Visit: Payer: Self-pay | Admitting: Cardiology

## 2014-10-11 ENCOUNTER — Other Ambulatory Visit: Payer: Self-pay | Admitting: Cardiology

## 2014-10-11 NOTE — Telephone Encounter (Signed)
Patient has still failed to schedule an appointment. Please advise on refill. Thanks, MI 

## 2014-10-11 NOTE — Telephone Encounter (Signed)
Will route this message to Dr Delton See to advise on refill

## 2015-09-06 DIAGNOSIS — H2513 Age-related nuclear cataract, bilateral: Secondary | ICD-10-CM | POA: Diagnosis not present

## 2015-09-06 DIAGNOSIS — Z83511 Family history of glaucoma: Secondary | ICD-10-CM | POA: Diagnosis not present

## 2015-09-06 DIAGNOSIS — H5203 Hypermetropia, bilateral: Secondary | ICD-10-CM | POA: Diagnosis not present

## 2016-03-28 ENCOUNTER — Telehealth: Payer: Self-pay | Admitting: Physician Assistant

## 2016-03-28 NOTE — Telephone Encounter (Signed)
Left pt message asking to call Allison back directly at [phone] to schedule AWV. Thanks! °

## 2016-05-15 NOTE — Telephone Encounter (Signed)
Left pt message asking to call Allison back directly at [phone] to schedule AWV. Thanks! °

## 2016-07-16 ENCOUNTER — Telehealth: Payer: Self-pay | Admitting: Medical

## 2016-07-16 ENCOUNTER — Telehealth: Payer: Self-pay | Admitting: Physician Assistant

## 2016-07-16 NOTE — Telephone Encounter (Signed)
Ok with me to switch but don't make switch until I see him.  He needs a fasting early am appointment to check lipid panel. He rarely comes in per epic. So don't switch unless he agrees to come in.

## 2016-07-16 NOTE — Telephone Encounter (Signed)
Ok with me 

## 2016-07-16 NOTE — Telephone Encounter (Signed)
Pt would like to [facility]. Pt says that [facility] office is closer for him.

## 2016-07-17 ENCOUNTER — Ambulatory Visit (HOSPITAL_BASED_OUTPATIENT_CLINIC_OR_DEPARTMENT_OTHER)
Admission: RE | Admit: 2016-07-17 | Discharge: 2016-07-17 | Disposition: A | Discharge status: Home / Self Care | Payer: PPO | Source: Ambulatory Visit | Attending: Medical | Admitting: Medical

## 2016-07-17 ENCOUNTER — Ambulatory Visit (INDEPENDENT_AMBULATORY_CARE_PROVIDER_SITE_OTHER): Payer: PPO | Admitting: Medical

## 2016-07-17 ENCOUNTER — Encounter: Payer: Self-pay | Admitting: Medical

## 2016-07-17 VITALS — BP 138/82 | HR 62 | Temp 97.8°F | Resp 16 | Ht 71.0 in | Wt 210.2 lb

## 2016-07-17 DIAGNOSIS — E785 Hyperlipidemia, unspecified: Secondary | ICD-10-CM | POA: Diagnosis not present

## 2016-07-17 DIAGNOSIS — M546 Pain in thoracic spine: Secondary | ICD-10-CM

## 2016-07-17 DIAGNOSIS — J42 Unspecified chronic bronchitis: Secondary | ICD-10-CM | POA: Insufficient documentation

## 2016-07-17 DIAGNOSIS — I350 Nonrheumatic aortic (valve) stenosis: Secondary | ICD-10-CM | POA: Diagnosis not present

## 2016-07-17 DIAGNOSIS — Z87891 Personal history of nicotine dependence: Secondary | ICD-10-CM | POA: Insufficient documentation

## 2016-07-17 DIAGNOSIS — I10 Essential (primary) hypertension: Secondary | ICD-10-CM | POA: Insufficient documentation

## 2016-07-17 DIAGNOSIS — R252 Cramp and spasm: Secondary | ICD-10-CM | POA: Diagnosis not present

## 2016-07-17 DIAGNOSIS — M62830 Muscle spasm of back: Secondary | ICD-10-CM | POA: Diagnosis not present

## 2016-07-17 LAB — LIPID PANEL
Cholesterol: 186 mg/dL (ref 0–200)
HDL: 43.1 mg/dL (ref >39.00)
LDL Cholesterol: 130 mg/dL — ABNORMAL HIGH (ref 0–99)
NonHDL: 142.53
TRIGLYCERIDES: 62 mg/dL (ref 0.0–149.0)
Total CHOL/HDL Ratio: 4
VLDL: 12.4 mg/dL (ref 0.0–40.0)

## 2016-07-17 LAB — MAGNESIUM: MAGNESIUM: 1.7 mg/dL (ref 1.5–2.5)

## 2016-07-17 MED ORDER — LISINOPRIL 5 MG PO TABS: ORAL_TABLET | ORAL | 11 refills | Status: DC

## 2016-07-17 MED ORDER — CYCLOBENZAPRINE HCL 5 MG PO TABS: ORAL_TABLET | ORAL | 0 refills | Status: DC

## 2016-07-17 NOTE — Patient Instructions (Addendum)
For your htn refilled your lisinopril. BP fair today. With hx of aortic stenosis good idea to be tightly controlled.  For high cholesterol check lipid panel today and cmp. May/likely  need to restart meds you have been off of for 2 years.  For back pain will get xray of tspine. If pain more constant low dose ibuprofen. And if needed at night can use flexeril.  With hx of smoking in past and back pain do want to get chest xray.  For cramps in calfs intermittent at night will follow cmp and include magnesium level.  Follow up in 3-4 weeks or as needed. If back pain not tapering off in a week let me know. May refer to sports med.

## 2016-07-17 NOTE — Telephone Encounter (Signed)
°  °  Ok with me to switch but don't make switch until I see him.  He needs a fasting early am appointment to check lipid panel. He rarely comes in per epic. So don't switch unless he agrees to come in.      Documentation

## 2016-07-17 NOTE — Progress Notes (Signed)
Subjective:    Patient ID: Werner LeanCarl T Chuong, male    DOB: 07/06/1945, 71 y.o.   MRN: [id number]  HPI   Pt in for first time with me.  Pt has recent back pain in his rt upper back area. Pain for about 2 weeks. No injury or fall. Pt states when he stands will have some pain and when he bends over. At rest no pain. During movements as described above will have level 7-8/10 pain. Will occur for 30 seconds or one minute during movement transition. 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 No rash on his   Pt blood pressure 138/82.  Pt has hx of hyperlipidemia. He has not checked level in a while. Pt has not been on cholesterol medication presently. He has not been on medication for 2 years.   Pt has blood pressure elevation in past but has not been on for 2 years. Hx of aortic stenosis. Had echo done in past.  Pt states rare heart burn/very seldom.  Will he get. He has been off nexium for 2 years.  Pt has remote history of smoking He stopped 20 years. Estimates he smoked for about 20 years.   Review of Systems  Constitutional: Negative for chills and fatigue.  HENT: Negative for congestion, ear pain, postnasal drip, sinus pain and sinus pressure.   Respiratory: Negative for chest tightness and shortness of breath.   Cardiovascular: Negative for chest pain and palpitations.  Gastrointestinal: Negative for abdominal pain, constipation, diarrhea and vomiting.  Genitourinary: Negative for dysuria and flank pain.  Musculoskeletal: Positive for back pain. Negative for joint swelling and neck pain.  Skin: Negative for rash.  Neurological: Negative for dizziness, syncope, weakness, numbness and headaches.  Hematological: Negative for adenopathy. Does not bruise/bleed easily.  Psychiatric/Behavioral: Negative for behavioral problems, confusion, hallucinations and sleep disturbance. The patient is not nervous/anxious.    Past Medical History:  Diagnosis Date  . Abnormal  liver function test   . Acute bronchitis   . Allergy   . Aortic valve disorders   . Broken ankle    Right  . Cardiac murmur   . Carotid artery occlusion    Bilateral Bruit  . Cataract   . Chest pain   . Chicken pox   . Chronic rhinitis   . DJD (degenerative joint disease)   . GERD (gastroesophageal reflux disease)   . History of UTI   . Hypercholesteremia   . Hypertension   . Lumbar back pain   . Overweight(278.02)      Social History   Social History  . Marital status: Married    Spouse name: stacey(deceased 2011)  . Number of children: 1  . Years of education: N/A   Occupational History  . welder    Social History Main Topics  . Smoking status: Former Smoker    Packs/day: 1.00    Years: 15.00    Types: Cigarettes    Quit date: 01/06/1989  . Smokeless tobacco: Never Used     Comment: chewed some in early 20's  . Alcohol use No  . Drug use: No  . Sexual activity: Not on file   Other Topics Concern  . Not on file   Social History Narrative   Uncle with prostate cancer?    Past Surgical History:  Procedure Laterality Date  . APPENDECTOMY    . COLONOSCOPY      Family History  Problem Relation Age of Onset  . Prostate  cancer Brother   . Colon cancer Father        metastatic  . Other Father        DJD  . Arthritis Father 63  . Healthy Mother        Living-87  . Heart attack Paternal Grandfather   . Heart disease Paternal Grandfather   . Cancer Paternal Uncle   . Colon cancer Sister   . Hypertension Daughter        x1  . Healthy Daughter        x1  . Esophageal cancer Neg Hx   . Stomach cancer Neg Hx   . Rectal cancer Neg Hx     No Known Allergies  Current Outpatient Prescriptions on File Prior to Visit  Medication Sig Dispense Refill  . aspirin 81 MG tablet Take 81 mg by mouth as needed.     Marland Kitchen atorvastatin (LIPITOR) 20 MG tablet Take 1 tablet (20 mg total) by mouth daily. (Patient not taking: Reported on 07/17/2016) 90 tablet 3  .  atorvastatin (LIPITOR) 20 MG tablet TAKE 1 TABLET (20 MG TOTAL) BY MOUTH DAILY. (Patient not taking: Reported on 07/17/2016) 90 tablet 0  . cyclobenzaprine (FLEXERIL) 10 MG tablet Take 10 mg by mouth 3 (three) times daily as needed for muscle spasms (Leg Cramps).    . fluticasone (FLONASE) 50 MCG/ACT nasal spray Place 2 sprays into both nostrils as needed. (Patient not taking: Reported on 07/17/2016) 16 g 11  . lisinopril (PRINIVIL,ZESTRIL) 5 MG tablet TAKE 1 TABLET (5 MG TOTAL) BY MOUTH DAILY. PATIENT NEEDS TO CONTACT DOCTOR FOR APPOINTMENT (Patient not taking: Reported on 07/17/2016) 14 tablet 0  . NEXIUM 40 MG capsule TAKE 1 CAPSULE BY MOUTH DAILY BEFORE BREAKFAST (Patient not taking: Reported on 07/17/2016) 30 capsule 5   No current facility-administered medications on file prior to visit.     BP 138/82   Pulse 62   Temp 97.8 F (36.6 C) (Oral)   Resp 16   Ht 5\' 11"  (1.803 m)   Wt 210 lb 3.2 oz (95.3 kg)   SpO2 99%   BMI 29.32 kg/m       Objective:   Physical Exam  General Mental Status- Alert. General Appearance- Not in acute distress.   Skin General: Color- Normal Color. Moisture- Normal Moisture.(no rash seen)  Neck Carotid Arteries- Normal color. Moisture- Normal Moisture. No carotid bruits. No JVD.  Chest and Lung Exam Auscultation: Breath Sounds:-Normal.  Cardiovascular Auscultation:Rythm- Regular. Murmurs & Other Heart Sounds:Auscultation of the heart reveals- Murmur(consistent with aortic stenosis II/VI) Abdomen Inspection:-Inspeection Normal. Palpation/Percussion:Note:No mass. Palpation and Percussion of the abdomen reveal- Non Tender, Non Distended + BS, no rebound or guarding.   Neurologic Cranial Nerve exam:- CN III-XII intact(No nystagmus), symmetric smile. Strength:- 5/5 equal and symmetric strength both upper and lower extremities.  Back- rt side paraspinal pain on palpation level just beneath scapula. On movement.(changing position pain increase).    Skin- no rash on back.      Assessment & Plan:  For your htn refilled your lisinopril. BP fair today. With hx of aortic stenosis good idea to be tightly controlled.  For high choleserol check lipid panel today and cmp. May/likely  need to restart meds you have been off of for 2 years.  For back pain will get xray of tspine. If pain more constant low dose ibuprofen. And if needed at night can use flexeril.  With hx of smoking in past and back pain do want to  get chest xray.  For cramps in calfs intermittent at night will follow cmp and include magnesium level.  Follow up in 3-4 weeks or as needed. If back pain not tapering off in a week let me know. May refer to sports med.  Tamirah George, Ramon Dredge, PA-C

## 2016-07-19 ENCOUNTER — Telehealth: Payer: Self-pay | Admitting: Medical

## 2016-07-19 DIAGNOSIS — E785 Hyperlipidemia, unspecified: Secondary | ICD-10-CM

## 2016-07-19 DIAGNOSIS — R0989 Other specified symptoms and signs involving the circulatory and respiratory systems: Secondary | ICD-10-CM

## 2016-07-19 DIAGNOSIS — I350 Nonrheumatic aortic (valve) stenosis: Secondary | ICD-10-CM

## 2016-07-19 DIAGNOSIS — I779 Disorder of arteries and arterioles, unspecified: Secondary | ICD-10-CM

## 2016-07-19 DIAGNOSIS — I10 Essential (primary) hypertension: Secondary | ICD-10-CM

## 2016-07-19 DIAGNOSIS — I739 Peripheral vascular disease, unspecified: Principal | ICD-10-CM

## 2016-07-19 MED ORDER — ATORVASTATIN CALCIUM 20 MG PO TABS: 20.0000 mg | ORAL_TABLET | Freq: Every day | ORAL | 3 refills | Status: DC

## 2016-07-19 NOTE — Telephone Encounter (Signed)
rx lipitor sent in. 

## 2016-08-07 ENCOUNTER — Ambulatory Visit: Payer: PPO | Admitting: Medical

## 2016-08-13 ENCOUNTER — Ambulatory Visit (INDEPENDENT_AMBULATORY_CARE_PROVIDER_SITE_OTHER): Payer: PPO | Admitting: Medical

## 2016-08-13 ENCOUNTER — Encounter: Payer: Self-pay | Admitting: Medical

## 2016-08-13 VITALS — BP 134/62 | HR 62 | Temp 98.1°F | Resp 16 | Ht 71.0 in | Wt 208.0 lb

## 2016-08-13 DIAGNOSIS — R5383 Other fatigue: Secondary | ICD-10-CM | POA: Diagnosis not present

## 2016-08-13 DIAGNOSIS — E785 Hyperlipidemia, unspecified: Secondary | ICD-10-CM | POA: Diagnosis not present

## 2016-08-13 DIAGNOSIS — M546 Pain in thoracic spine: Secondary | ICD-10-CM | POA: Diagnosis not present

## 2016-08-13 DIAGNOSIS — I10 Essential (primary) hypertension: Secondary | ICD-10-CM | POA: Diagnosis not present

## 2016-08-13 DIAGNOSIS — Z23 Encounter for immunization: Secondary | ICD-10-CM | POA: Diagnosis not present

## 2016-08-13 LAB — CBC WITH DIFFERENTIAL/PLATELET
BASOS PCT: 1.2 % (ref 0.0–3.0)
Basophils Absolute: 0.1 10*3/uL (ref 0.0–0.1)
EOS ABS: 0.2 10*3/uL (ref 0.0–0.7)
EOS PCT: 3.7 % (ref 0.0–5.0)
HEMATOCRIT: 40.9 % (ref 39.0–52.0)
Hemoglobin: 13.9 g/dL (ref 13.0–17.0)
LYMPHS PCT: 28.6 % (ref 12.0–46.0)
Lymphs Abs: 1.9 10*3/uL (ref 0.7–4.0)
MCHC: 34 g/dL (ref 30.0–36.0)
MCV: 86.9 fl (ref 78.0–100.0)
Monocytes Absolute: 0.5 10*3/uL (ref 0.1–1.0)
Monocytes Relative: 7.9 % (ref 3.0–12.0)
NEUTROS ABS: 3.8 10*3/uL (ref 1.4–7.7)
Neutrophils Relative %: 58.6 % (ref 43.0–77.0)
PLATELETS: 195 10*3/uL (ref 150.0–400.0)
RBC: 4.7 Mil/uL (ref 4.22–5.81)
RDW: 13.8 % (ref 11.5–15.5)
WBC: 6.5 10*3/uL (ref 4.0–10.5)

## 2016-08-13 LAB — TSH: TSH: 2.11 u[IU]/mL (ref 0.35–4.50)

## 2016-08-13 NOTE — Patient Instructions (Addendum)
For your fatigue will get cbc and tsh level today. As discussed could expand work up if studies negative and you fatigue worsens.  For high cholesterol prescribed your lipid medication atorvastatin.  For high htn continue lisinopril. Well controlled.  Back pain resolved. If pain flares again let us know.  PSV-23 update today.  Follow up nurse flu vaccine visit October.  Otherwise follow up with me 3-6 months

## 2016-08-13 NOTE — Progress Notes (Signed)
Subjective:    Patient ID: Jerry Ferguson, male    DOB: 1945/10/21, 71 y.o.   MRN: [id number]  HPI  Pt in for bp follow up. Better than last time.  Pt states also his rt upper back pain has mostly resolved now. States pain level about 1/10 now. Prior was 7-8/10.  Pt does some faint  Fatigue(very low level). He relates to possible age. He thinks maybe age related. No history anemia or thyroid disorders. He declines b12 and vit d level in work up but willing to do cbc and cmp.     Review of Systems  Constitutional: Positive for fatigue. Negative for chills and fever.  HENT: Negative for congestion, ear pain and postnasal drip.   Respiratory: Negative for cough, choking, chest tightness, shortness of breath and wheezing.   Cardiovascular: Negative for chest pain, palpitations and leg swelling.  Gastrointestinal: Negative for abdominal distention, abdominal pain, anal bleeding, constipation, diarrhea, nausea and vomiting.  Musculoskeletal: Negative for back pain and neck pain.  Skin: Negative for rash.  Neurological: Negative for dizziness, tremors, speech difficulty, light-headedness and headaches.  Hematological: Negative for adenopathy. Does not bruise/bleed easily.  Psychiatric/Behavioral: Negative for behavioral problems, confusion and self-injury. The patient is not nervous/anxious.     Past Medical History:  Diagnosis Date  . Abnormal liver function test   . Acute bronchitis   . Allergy   . Aortic valve disorders   . Broken ankle    Right  . Cardiac murmur   . Carotid artery occlusion    Bilateral Bruit  . Cataract   . Chest pain   . Chicken pox   . Chronic rhinitis   . DJD (degenerative joint disease)   . GERD (gastroesophageal reflux disease)   . History of UTI   . Hypercholesteremia   . Hypertension   . Lumbar back pain   . Overweight(278.02)      Social History   Social History  . Marital status: Married    Spouse name: stacey(deceased 06/06/2009)  .  Number of children: 1  . Years of education: N/A   Occupational History  . welder    Social History Main Topics  . Smoking status: Former Smoker    Packs/day: 1.00    Years: 15.00    Types: Cigarettes    Quit date: 01/06/1989  . Smokeless tobacco: Former Neurosurgeon    Types: Snuff     Comment: chewed some in early 06-07-2022  . Alcohol use No  . Drug use: No  . Sexual activity: Not on file   Other Topics Concern  . Not on file   Social History Narrative   Uncle with prostate cancer?    Past Surgical History:  Procedure Laterality Date  . APPENDECTOMY    . COLONOSCOPY      Family History  Problem Relation Age of Onset  . Colon cancer Father        metastatic  . Other Father        DJD  . Arthritis Father 34  . Healthy Mother        Living-87  . Prostate cancer Brother   . Heart attack Paternal Grandfather   . Heart disease Paternal Grandfather   . Cancer Paternal Uncle   . Colon cancer Sister   . Hypertension Daughter        x1  . Healthy Daughter        x1  . Esophageal cancer Neg Hx   . Stomach  cancer Neg Hx   . Rectal cancer Neg Hx     No Known Allergies  Current Outpatient Prescriptions on File Prior to Visit  Medication Sig Dispense Refill  . aspirin 81 MG tablet Take 81 mg by mouth as needed.     Marland Kitchen. atorvastatin (LIPITOR) 20 MG tablet Take 1 tablet (20 mg total) by mouth daily. 30 tablet 3  . cyclobenzaprine (FLEXERIL) 5 MG tablet 1 tab po q hs as needed back pain or spasms. 20 tablet 0  . fluticasone (FLONASE) 50 MCG/ACT nasal spray Place 2 sprays into both nostrils as needed. 16 g 11  . lisinopril (PRINIVIL,ZESTRIL) 5 MG tablet 1 tab po q day 30 tablet 11   No current facility-administered medications on file prior to visit.     BP 134/62   Pulse 62   Temp 98.1 F (36.7 C) (Oral)   Resp 16   Ht 5\' 11"  (1.803 m)   Wt 208 lb (94.3 kg)   SpO2 98%   BMI 29.01 kg/m       Objective:   Physical Exam  General Appearance- Not in acute  distress.    Chest and Lung Exam Auscultation: Breath sounds:-Normal. Clear even and unlabored. Adventitious sounds:- No Adventitious sounds.  Cardiovascular Auscultation:Rythm - Regular, rate and rythm. Heart Sounds -Normal heart sounds.  Abdomen Inspection:-Inspection Normal.  Palpation/Perucssion: Palpation and Percussion of the abdomen reveal- Non Tender, No Rebound tenderness, No rigidity(Guarding) and No Palpable abdominal masses.  Liver:-Normal.  Spleen:- Normal.   Back No Mid lumbar spine tenderness to palpation. No Pain on straight leg lift.  NO Pain on lateral movements and flexion/extension of the spine.        Assessment & Plan:  For your fatigue will get cbc and tsh level today. As discussed could expand work up if studies negative and you fatigue worsens.(pt declined b12 and vit d level today)  For high cholesterol prescribed your lipid medication atorvastatin.  For htn cholesterol continue lisinopril.  Back pain resolved. If pain flares again let us know.  PSV-23 update today.  Follow up nurse flu vaccine visit October.  Otherwise follow up with me 3-6 months  Yousra Ivens, Ramon DredgeEdward, [place]-C

## 2016-10-20 ENCOUNTER — Other Ambulatory Visit: Payer: Self-pay

## 2016-10-20 DIAGNOSIS — I350 Nonrheumatic aortic (valve) stenosis: Secondary | ICD-10-CM

## 2016-10-20 DIAGNOSIS — E785 Hyperlipidemia, unspecified: Secondary | ICD-10-CM

## 2016-10-20 DIAGNOSIS — I779 Disorder of arteries and arterioles, unspecified: Secondary | ICD-10-CM

## 2016-10-20 DIAGNOSIS — R0989 Other specified symptoms and signs involving the circulatory and respiratory systems: Secondary | ICD-10-CM

## 2016-10-20 DIAGNOSIS — I739 Peripheral vascular disease, unspecified: Principal | ICD-10-CM

## 2016-10-20 DIAGNOSIS — I10 Essential (primary) hypertension: Secondary | ICD-10-CM

## 2016-10-20 MED ORDER — ATORVASTATIN CALCIUM 20 MG PO TABS
20.0000 mg | ORAL_TABLET | Freq: Every day | ORAL | 3 refills | Status: DC
Start: 2016-10-20 — End: 2017-01-22

## 2016-10-21 ENCOUNTER — Other Ambulatory Visit (INDEPENDENT_AMBULATORY_CARE_PROVIDER_SITE_OTHER): Payer: PPO

## 2016-10-21 DIAGNOSIS — E785 Hyperlipidemia, unspecified: Secondary | ICD-10-CM

## 2016-10-21 DIAGNOSIS — Z23 Encounter for immunization: Secondary | ICD-10-CM | POA: Diagnosis not present

## 2016-10-21 DIAGNOSIS — I10 Essential (primary) hypertension: Secondary | ICD-10-CM

## 2016-10-21 LAB — COMPREHENSIVE METABOLIC PANEL
ALT: 24 U/L (ref 0–53)
AST: 22 U/L (ref 0–37)
Albumin: 4.1 g/dL (ref 3.5–5.2)
Alkaline Phosphatase: 53 U/L (ref 39–117)
BILIRUBIN TOTAL: 1.7 mg/dL — AB (ref 0.2–1.2)
BUN: 18 mg/dL (ref 6–23)
CO2: 28 meq/L (ref 19–32)
Calcium: 9 mg/dL (ref 8.4–10.5)
Chloride: 105 mEq/L (ref 96–112)
Creatinine, Ser: 1.06 mg/dL (ref 0.40–1.50)
GFR: 73.18 mL/min (ref >60.00)
GLUCOSE: 100 mg/dL — AB (ref 70–99)
Potassium: 3.8 mEq/L (ref 3.5–5.1)
SODIUM: 141 meq/L (ref 135–145)
Total Protein: 6.6 g/dL (ref 6.0–8.3)

## 2016-10-22 NOTE — Progress Notes (Signed)
Jerry Ferguson returned your call. Please call him back at [phone]

## 2016-10-24 ENCOUNTER — Telehealth: Payer: Self-pay | Admitting: Medical

## 2016-10-24 NOTE — Telephone Encounter (Signed)
Relation to ZO:XWRUpt:self Call back number:[phone] Pharmacy:  Reason for call:  Patient 2x returning call regarding lab results, please advise

## 2016-10-26 ENCOUNTER — Telehealth: Payer: Self-pay | Admitting: Medical

## 2016-10-26 NOTE — Telephone Encounter (Signed)
We called pt and send letter. Then it looked like he called twice but I was busy seeing patients when he called and saw his message after hours. Looks like he is due for flu vaccine. I expect to busy all week. Wonder when I can call him. Would he be willing to come in some time this week. Look for convenient time lot 8 am or 1 pm so he is first patient and does not have to wait. This would be helpful since I can't predict when or if I will be able call him.  You can also try to go over with him the result note comments I made for the cmp. See those comments

## 2016-10-26 NOTE — Telephone Encounter (Signed)
Opened to review 

## 2016-10-26 NOTE — Telephone Encounter (Signed)
We called pt and send letter. Then it looked like he called twice but I was busy seeing patients when he called and saw his message after hours. Looks like he is due for flu vaccine. I expect to busy all week. Wonder when I can call him. Would he be willing to come in some time this week. Look for convenient time lot 8 am or 1 pm so he is first patient and does not have to wait. This would be helpful since I can't predict when or if I will be able call him.

## 2016-10-27 NOTE — Telephone Encounter (Signed)
Notified pt. 

## 2016-10-27 NOTE — Telephone Encounter (Signed)
Patient contacted and transferred to [facility]

## 2016-12-01 ENCOUNTER — Telehealth: Payer: Self-pay | Admitting: Medical

## 2016-12-01 NOTE — Telephone Encounter (Signed)
Copied from CRM [id number]. Topic: Medicare AWV >> Dec 01, 2016 10:42 AM Lynnell ChadFoskey, Sheneka L wrote: Reason for CRM: Left message for patient to call back and schedule Medicare Annual Wellness Visit (AWV).  Mr. Theodis AguasMichaelis has never received AWV before; please schedule appointment at anytime with [facility].   [facility]. SF

## 2017-01-22 ENCOUNTER — Other Ambulatory Visit: Payer: Self-pay | Admitting: Medical

## 2017-01-22 DIAGNOSIS — I779 Disorder of arteries and arterioles, unspecified: Secondary | ICD-10-CM

## 2017-01-22 DIAGNOSIS — I739 Peripheral vascular disease, unspecified: Principal | ICD-10-CM

## 2017-01-22 DIAGNOSIS — R0989 Other specified symptoms and signs involving the circulatory and respiratory systems: Secondary | ICD-10-CM

## 2017-01-22 DIAGNOSIS — I10 Essential (primary) hypertension: Secondary | ICD-10-CM

## 2017-01-22 DIAGNOSIS — I350 Nonrheumatic aortic (valve) stenosis: Secondary | ICD-10-CM

## 2017-01-22 DIAGNOSIS — E785 Hyperlipidemia, unspecified: Secondary | ICD-10-CM

## 2017-02-11 ENCOUNTER — Ambulatory Visit: Payer: PPO | Admitting: Medical

## 2017-02-11 DIAGNOSIS — Z0289 Encounter for other administrative examinations: Secondary | ICD-10-CM

## 2017-05-22 ENCOUNTER — Other Ambulatory Visit: Payer: Self-pay | Admitting: Medical

## 2017-05-22 DIAGNOSIS — I350 Nonrheumatic aortic (valve) stenosis: Secondary | ICD-10-CM

## 2017-05-22 DIAGNOSIS — I779 Disorder of arteries and arterioles, unspecified: Secondary | ICD-10-CM

## 2017-05-22 DIAGNOSIS — E785 Hyperlipidemia, unspecified: Secondary | ICD-10-CM

## 2017-05-22 DIAGNOSIS — R0989 Other specified symptoms and signs involving the circulatory and respiratory systems: Secondary | ICD-10-CM

## 2017-05-22 DIAGNOSIS — I739 Peripheral vascular disease, unspecified: Principal | ICD-10-CM

## 2017-05-22 DIAGNOSIS — I10 Essential (primary) hypertension: Secondary | ICD-10-CM

## 2017-06-16 ENCOUNTER — Telehealth: Payer: Self-pay | Admitting: *Deleted

## 2017-06-16 DIAGNOSIS — I739 Peripheral vascular disease, unspecified: Principal | ICD-10-CM

## 2017-06-16 DIAGNOSIS — R0989 Other specified symptoms and signs involving the circulatory and respiratory systems: Secondary | ICD-10-CM

## 2017-06-16 DIAGNOSIS — I10 Essential (primary) hypertension: Secondary | ICD-10-CM

## 2017-06-16 DIAGNOSIS — I779 Disorder of arteries and arterioles, unspecified: Secondary | ICD-10-CM

## 2017-06-16 DIAGNOSIS — I350 Nonrheumatic aortic (valve) stenosis: Secondary | ICD-10-CM

## 2017-06-16 DIAGNOSIS — E785 Hyperlipidemia, unspecified: Secondary | ICD-10-CM

## 2017-06-16 MED ORDER — ATORVASTATIN CALCIUM 20 MG PO TABS: 20.0000 mg | ORAL_TABLET | Freq: Every day | ORAL | 0 refills | Status: DC

## 2017-06-16 NOTE — Telephone Encounter (Signed)
Received fax from pharmacy requesting 90 day supply of atorvastatin. Last Rx given on 05/22/17 was for 30 tablets with note that office visit was needed for further refills. 2 week supply sent with note that appt is needed. Please call pt to schedule appt with Ramon DredgeEdward soon. Thanks!

## 2017-06-17 MED ORDER — LISINOPRIL 5 MG PO TABS: ORAL_TABLET | ORAL | 0 refills | Status: DC

## 2017-06-17 MED ORDER — ATORVASTATIN CALCIUM 20 MG PO TABS: 20.0000 mg | ORAL_TABLET | Freq: Every day | ORAL | 0 refills | Status: DC

## 2017-06-17 NOTE — Telephone Encounter (Signed)
Called pt, pt states [place] at [place], [place], pt mentioned is staying out there til September since pt lost his sister and now has his mother hospitalized, pt is taking care of his mother and will not be back til September. Pt would like to know if he can still get his rx sent to his pharmacy since it won't be til a while for him to return, but will call us ASAP when he returns back home.  Pt state that his son is the one that makes the arrangement for pt to receives his medication from his pharmacy. If pt needs to be contacted to please call him at his cell phone at [phone].

## 2017-06-17 NOTE — Telephone Encounter (Signed)
Sent in 90 day rx of both atorvastatin and zestril. [place] out of [place] and needed 90 days rx. Pt states son in law will pick up rx and mail to him.

## 2017-06-17 NOTE — Telephone Encounter (Signed)
Jerry Ferguson-- please see below response and advise pt's request for 90 day supply?

## 2017-09-13 ENCOUNTER — Telehealth: Payer: Self-pay | Admitting: Medical

## 2017-09-13 DIAGNOSIS — R0989 Other specified symptoms and signs involving the circulatory and respiratory systems: Secondary | ICD-10-CM

## 2017-09-13 DIAGNOSIS — I739 Peripheral vascular disease, unspecified: Principal | ICD-10-CM

## 2017-09-13 DIAGNOSIS — I10 Essential (primary) hypertension: Secondary | ICD-10-CM

## 2017-09-13 DIAGNOSIS — E785 Hyperlipidemia, unspecified: Secondary | ICD-10-CM

## 2017-09-13 DIAGNOSIS — I350 Nonrheumatic aortic (valve) stenosis: Secondary | ICD-10-CM

## 2017-09-13 DIAGNOSIS — I779 Disorder of arteries and arterioles, unspecified: Secondary | ICD-10-CM

## 2017-09-14 ENCOUNTER — Telehealth: Payer: Self-pay | Admitting: Medical

## 2017-09-14 DIAGNOSIS — R972 Elevated prostate specific antigen [PSA]: Secondary | ICD-10-CM

## 2017-09-14 NOTE — Telephone Encounter (Signed)
Pt due for follow up please call and schedule appointment.  

## 2017-09-14 NOTE — Telephone Encounter (Signed)
Copied from CRM [id number]. Topic: Quick Communication - Rx Refill/Question >> Sep 14, 2017 11:58 AM Herby Abraham C wrote: Medication: lisinopril (PRINIVIL,ZESTRIL) 5 MG tablet   Has the patient contacted their pharmacy? No   (Agent: If no, request that the patient contact the pharmacy for the refill.) (Agent: If yes, when and what did the pharmacy advise?)  Preferred Pharmacy (with phone number or street name): [id number] - [place], [place] - [address]  Agent: Please be advised that RX refills may take up to 3 business days. We ask that you follow-up with your pharmacy.

## 2017-09-14 NOTE — Telephone Encounter (Signed)
Pt returned call, made pt aware of reason for call. Pt says that she is out of the stated dealing with the death of his sister and mother. He will be back in [place] in a few weeks, he will call back in to schedule apt then.

## 2017-09-14 NOTE — Telephone Encounter (Signed)
Called pt at home and cell phone and no answer, twice. Will try later.

## 2017-09-15 ENCOUNTER — Other Ambulatory Visit: Payer: Self-pay

## 2017-09-15 MED ORDER — LISINOPRIL 5 MG PO TABS: ORAL_TABLET | ORAL | 0 refills | Status: DC

## 2017-10-09 ENCOUNTER — Ambulatory Visit (HOSPITAL_BASED_OUTPATIENT_CLINIC_OR_DEPARTMENT_OTHER)
Admission: RE | Admit: 2017-10-09 | Discharge: 2017-10-09 | Disposition: A | Discharge status: Home / Self Care | Payer: PPO | Source: Ambulatory Visit | Attending: Medical | Admitting: Medical

## 2017-10-09 ENCOUNTER — Ambulatory Visit (INDEPENDENT_AMBULATORY_CARE_PROVIDER_SITE_OTHER): Payer: PPO | Admitting: Medical

## 2017-10-09 ENCOUNTER — Encounter: Payer: Self-pay | Admitting: Medical

## 2017-10-09 VITALS — BP 110/65 | HR 61 | Temp 98.2°F | Resp 16 | Ht 71.0 in | Wt 195.4 lb

## 2017-10-09 DIAGNOSIS — M19012 Primary osteoarthritis, left shoulder: Secondary | ICD-10-CM | POA: Diagnosis not present

## 2017-10-09 DIAGNOSIS — L989 Disorder of the skin and subcutaneous tissue, unspecified: Secondary | ICD-10-CM | POA: Diagnosis not present

## 2017-10-09 DIAGNOSIS — I350 Nonrheumatic aortic (valve) stenosis: Secondary | ICD-10-CM | POA: Diagnosis not present

## 2017-10-09 DIAGNOSIS — I10 Essential (primary) hypertension: Secondary | ICD-10-CM | POA: Diagnosis not present

## 2017-10-09 DIAGNOSIS — I6523 Occlusion and stenosis of bilateral carotid arteries: Secondary | ICD-10-CM | POA: Diagnosis not present

## 2017-10-09 DIAGNOSIS — E785 Hyperlipidemia, unspecified: Secondary | ICD-10-CM

## 2017-10-09 DIAGNOSIS — Z23 Encounter for immunization: Secondary | ICD-10-CM | POA: Diagnosis not present

## 2017-10-09 DIAGNOSIS — M8580 Other specified disorders of bone density and structure, unspecified site: Secondary | ICD-10-CM | POA: Diagnosis not present

## 2017-10-09 DIAGNOSIS — M25512 Pain in left shoulder: Secondary | ICD-10-CM | POA: Insufficient documentation

## 2017-10-09 DIAGNOSIS — Z125 Encounter for screening for malignant neoplasm of prostate: Secondary | ICD-10-CM | POA: Diagnosis not present

## 2017-10-09 DIAGNOSIS — S46811A Strain of other muscles, fascia and tendons at shoulder and upper arm level, right arm, initial encounter: Secondary | ICD-10-CM | POA: Diagnosis not present

## 2017-10-09 MED ORDER — CYCLOBENZAPRINE HCL 10 MG PO TABS: 10.0000 mg | ORAL_TABLET | Freq: Every day | ORAL | 0 refills | Status: DC

## 2017-10-09 NOTE — Patient Instructions (Addendum)
Your blood pressure is tightly controlled today.  Continue on current regimen.   For history of aortic stenosis, I am going to refer you back to your cardiologist Andreas Ohm.  Is been about 5 years since her last echo and I think that will likely be done to evaluate your ejection fraction.  For history of carotid stenosis, I am going to try to refer you to former vascular surgeon.(Name in the chart is Leonides Sake)  For history of left shoulder pain with limited range of motion, I am going to order x-ray.  We will see what those x-rays show.  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  So after reviewing x-rays might refer you to sports medicine or physical therapy.  For high cholesterol, hypertension and screening for prostate cancer, I am placing future labs to be done fasting early next week.  Please schedule that appointment.  For chronic right trapezius intermittent tightness/pain with described strain features, I am going to make Flexeril 10 mg tablets available.  Use at night and only as needed.  Stress sparing use  Also for forehead skin lesion I am going to refer you to dermatologist.  We will try to request for dermatology in [place]..  Follow-up date to be determined after lab review.

## 2017-10-09 NOTE — Progress Notes (Signed)
Subjective:    Patient ID: Jerry Ferguson, male    DOB: 15-Dec-1945, 72 y.o.   MRN: [id number]  HPI  Pt in for follow up.   Pt has htn and hyperlipidemia.  Pt bp is low today when checked with machine initially . No lightheaded sensation when he stands up quickly. No syncope. He is on lisinopril.    Pt has history of high cholesterol. He is on statin. No reported side effects.  Pt states he had limited range of motion to left shoulder after football injury in [employer]. Just recently some pain on movement pain behind shoulder and in tricep area. No chest pain.   Review of Systems  Constitutional: Negative for chills, fatigue and fever.  Respiratory: Negative for chest tightness, shortness of breath and wheezing.   Cardiovascular: Negative for chest pain and palpitations.  Gastrointestinal: Negative for abdominal pain.  Genitourinary: Negative for dysuria.  Musculoskeletal: Negative for back pain.       Shoulder pain.  Skin: Negative for rash.  Neurological: Negative for dizziness, speech difficulty, weakness, numbness and headaches.  Hematological: Negative for adenopathy. Does not bruise/bleed easily.  Psychiatric/Behavioral: Negative for behavioral problems and confusion.       Objective:   Physical Exam  General Mental Status- Alert. General Appearance- Not in acute distress.   Skin General: Color- Normal Color. Moisture- Normal Moisture. Thick arised area on left side of forehead.  Neck Carotid Arteries- Normal color. Moisture- Normal Moisture. Rt side carotid bruit. No JVD. Rt side trapezius tenderness to palpation.  Chest and Lung Exam Auscultation: Breath Sounds:-Normal.  Cardiovascular Auscultation:Rythm- Regular. Murmurs & Other Heart Sounds:Auscultation of the heart reveals- Murmur.(hx aortic stenosis)  Abdomen Inspection:-Inspeection Normal. Palpation/Percussion:Note:No mass. Palpation and Percussion of the abdomen reveal- Non Tender, Non  Distended + BS, no rebound or guarding.    Neurologic Cranial Nerve exam:- CN III-XII intact(No nystagmus), symmetric smile. Strength:- 5/5 equal and symmetric strength both upper and lower extremities.  Lt shoulder- restricted range of motion. Can't left arm above shoulder level.  Skin- left side forehead. Raised thick skin lesion. Possible sk.     Assessment & Plan:  Your blood pressure is tightly controlled today.  Continue on current regimen.   For history of aortic stenosis, I am going to refer you back to your cardiologist Andreas Ohm.  Is been about 5 years since her last echo and I think that will likely be done to evaluate your ejection fraction.  For history of carotid stenosis, I am going to try to refer you to former vascular surgeon.(Name in the chart is Leonides Sake)  For history of left shoulder pain with limited range of motion, I am going to order x-ray.  We will see what those x-rays show.  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  So after reviewing x-rays might refer you to sports medicine or physical therapy.  For high cholesterol, hypertension and screening for prostate cancer, I am placing future labs to be done fasting early next week.  Please schedule that appointment.  For chronic right trapezius intermittent tightness/pain with described strain features, I am going to make Flexeril 10 mg tablets available.  Use at night and only as needed.  Stress sparing use  Also for forehead skin lesion I am going to refer you to dermatologist.  We will try to request for dermatology in [place]..  Follow-up date to be determined after lab review.  40 minutes with pt. 50% of time  spent with pt counseling on treatment plan for each condition.  Esperanza Richters, PA-C

## 2017-10-10 ENCOUNTER — Telehealth: Payer: Self-pay | Admitting: Medical

## 2017-10-10 DIAGNOSIS — M8580 Other specified disorders of bone density and structure, unspecified site: Secondary | ICD-10-CM

## 2017-10-10 MED ORDER — ALENDRONATE SODIUM 10 MG PO TABS: 10.0000 mg | ORAL_TABLET | Freq: Every day | ORAL | 11 refills | Status: DC

## 2017-10-10 NOTE — Telephone Encounter (Signed)
Placed dexascan for osteopenia.

## 2017-10-13 ENCOUNTER — Other Ambulatory Visit (INDEPENDENT_AMBULATORY_CARE_PROVIDER_SITE_OTHER): Payer: PPO

## 2017-10-13 DIAGNOSIS — E785 Hyperlipidemia, unspecified: Secondary | ICD-10-CM | POA: Diagnosis not present

## 2017-10-13 DIAGNOSIS — I10 Essential (primary) hypertension: Secondary | ICD-10-CM

## 2017-10-13 DIAGNOSIS — Z125 Encounter for screening for malignant neoplasm of prostate: Secondary | ICD-10-CM

## 2017-10-13 DIAGNOSIS — I6523 Occlusion and stenosis of bilateral carotid arteries: Secondary | ICD-10-CM

## 2017-10-13 LAB — CBC WITH DIFFERENTIAL/PLATELET
BASOS PCT: 1.2 % (ref 0.0–3.0)
Basophils Absolute: 0.1 10*3/uL (ref 0.0–0.1)
EOS ABS: 0.3 10*3/uL (ref 0.0–0.7)
EOS PCT: 3.4 % (ref 0.0–5.0)
HCT: 39.7 % (ref 39.0–52.0)
HEMOGLOBIN: 13.5 g/dL (ref 13.0–17.0)
Lymphocytes Relative: 21.2 % (ref 12.0–46.0)
Lymphs Abs: 1.7 10*3/uL (ref 0.7–4.0)
MCHC: 34 g/dL (ref 30.0–36.0)
MCV: 88 fl (ref 78.0–100.0)
MONO ABS: 0.6 10*3/uL (ref 0.1–1.0)
Monocytes Relative: 7.7 % (ref 3.0–12.0)
NEUTROS ABS: 5.2 10*3/uL (ref 1.4–7.7)
Neutrophils Relative %: 66.5 % (ref 43.0–77.0)
PLATELETS: 202 10*3/uL (ref 150.0–400.0)
RBC: 4.51 Mil/uL (ref 4.22–5.81)
RDW: 13.7 % (ref 11.5–15.5)
WBC: 7.8 10*3/uL (ref 4.0–10.5)

## 2017-10-13 LAB — LIPID PANEL
CHOL/HDL RATIO: 3
Cholesterol: 114 mg/dL (ref 0–200)
HDL: 45.4 mg/dL (ref >39.00)
LDL CALC: 59 mg/dL (ref 0–99)
NonHDL: 69
TRIGLYCERIDES: 51 mg/dL (ref 0.0–149.0)
VLDL: 10.2 mg/dL (ref 0.0–40.0)

## 2017-10-13 LAB — COMPREHENSIVE METABOLIC PANEL
ALT: 16 U/L (ref 0–53)
AST: 14 U/L (ref 0–37)
Albumin: 4.2 g/dL (ref 3.5–5.2)
Alkaline Phosphatase: 55 U/L (ref 39–117)
BUN: 17 mg/dL (ref 6–23)
CALCIUM: 8.9 mg/dL (ref 8.4–10.5)
CHLORIDE: 106 meq/L (ref 96–112)
CO2: 28 meq/L (ref 19–32)
CREATININE: 0.97 mg/dL (ref 0.40–1.50)
GFR: 80.84 mL/min (ref >60.00)
Glucose, Bld: 110 mg/dL — ABNORMAL HIGH (ref 70–99)
Potassium: 4 mEq/L (ref 3.5–5.1)
SODIUM: 140 meq/L (ref 135–145)
Total Bilirubin: 1.2 mg/dL (ref 0.2–1.2)
Total Protein: 6.3 g/dL (ref 6.0–8.3)

## 2017-10-13 LAB — PSA: PSA: 6.36 ng/mL — ABNORMAL HIGH (ref 0.10–4.00)

## 2017-10-13 NOTE — Telephone Encounter (Signed)
See important urology referral.

## 2017-10-14 ENCOUNTER — Telehealth: Payer: Self-pay | Admitting: Medical

## 2017-10-14 NOTE — Telephone Encounter (Signed)
Patient called for results for X-ray.   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 For thin bones I can prescribe fosamax and place order for dexascan to assess bone density better.  Patient notified. Patient will do bone density and then wants to discuss need for Fosamax with PCP with that result. Please place referral.

## 2017-10-15 NOTE — Telephone Encounter (Signed)
Ok. Advise to get scan and will see if bone density study agrees with xray.

## 2017-11-11 ENCOUNTER — Encounter: Payer: Self-pay | Admitting: Cardiovascular Disease

## 2017-11-11 ENCOUNTER — Ambulatory Visit: Payer: PPO | Admitting: Cardiovascular Disease

## 2017-11-11 VITALS — BP 130/74 | HR 67 | Ht 71.0 in | Wt 200.0 lb

## 2017-11-11 DIAGNOSIS — I350 Nonrheumatic aortic (valve) stenosis: Secondary | ICD-10-CM | POA: Diagnosis not present

## 2017-11-11 NOTE — Patient Instructions (Signed)
Medication Instructions:  Your physician recommends that you continue on your current medications as directed. Please refer to the Current Medication list given to you today.  If you need a refill on your cardiac medications before your next appointment, please call your pharmacy.    Lab work: None Ordered    Testing/Procedures: Your physician has requested that you have an echocardiogram. Echocardiography is a painless test that uses sound waves to create images of your heart. It provides your doctor with information about the size and shape of your heart and how well your heart's chambers and valves are working. This procedure takes approximately one hour. There are no restrictions for this procedure.    Follow-Up: At [facility], you and your health needs are our priority.  As part of our continuing mission to provide you with exceptional heart care, we have created designated Provider Care Teams.  These Care Teams include your primary Cardiologist (physician) and Advanced Practice Providers (APPs -  Physician Assistants and Nurse Practitioners) who all work together to provide you with the care you need, when you need it. You will need a follow up appointment in:  1 years.  Please call our office 2 months in advance to schedule this appointment.  You may see Philip Nahser, MD or one of the following Advanced Practice Providers on your designated Care Team: Scott Weaver, PA-C Vin Bhagat, PA-C . Janine Hammond, NP   

## 2017-11-11 NOTE — Progress Notes (Signed)
Cardiology Office Note:    Date:  11/11/2017   ID:  [name], DOB 20-Jun-1945, MRN [id number]  PCP:  Jerry Richters, PA-C  Cardiologist:  Kristeen Miss, MD  Electrophysiologist:  None   Referring MD: Jerry Ferguson   Chief Complaint  Patient presents with  . Aortic Stenosis     Nov. 6, 2019   Jerry Ferguson is a 72 y.o. male with a hx of  HTN, HLD and aorticstenosis.  We were asked to see him by Jerry Richters, PA-C for further evaluation of his aortic stenosis.    His last echocardiogram was performed June, 2015.  At that time, he had normal left ventricular systolic function.  He had moderate aortic stenosis. The mean aortic valve gradient was 23 mmHg with a peak aortic valve gradient of 41 mmHg. Mildly dilated ascending aorta.  He feels quite well Has mild DOE Mows his lawn without any problem  No CP , no syncope  No cough,  No weight gain Has lost some weight - has been traveling lots   Past Medical History:  Diagnosis Date  . Abnormal liver function test   . Acute bronchitis   . Allergy   . Aortic valve disorders   . Broken ankle    Right  . Cardiac murmur   . Carotid artery occlusion    Bilateral Bruit  . Cataract   . Chest pain   . Chicken pox   . Chronic rhinitis   . DJD (degenerative joint disease)   . GERD (gastroesophageal reflux disease)   . History of UTI   . Hypercholesteremia   . Hypertension   . Lumbar back pain   . Overweight(278.02)     Past Surgical History:  Procedure Laterality Date  . APPENDECTOMY    . COLONOSCOPY      Current Medications: Current Meds  Medication Sig  . atorvastatin (LIPITOR) 20 MG tablet TAKE 1 TABLET BY MOUTH EVERY DAY  . cyclobenzaprine (FLEXERIL) 10 MG tablet Take 1 tablet (10 mg total) by mouth at bedtime.  . fluticasone (FLONASE) 50 MCG/ACT nasal spray Place 2 sprays into both nostrils as needed.  Marland Kitchen lisinopril (PRINIVIL,ZESTRIL) 5 MG tablet 1 tab po q day     Allergies:   Patient  has no known allergies.   Social History   Socioeconomic History  . Marital status: Married    Spouse name: stacey(deceased 05/23/09)  . Number of children: 1  . Years of education: Not on file  . Highest education level: Not on file  Occupational History  . Occupation: welder  Social Needs  . Financial resource strain: Not on file  . Food insecurity:    Worry: Not on file    Inability: Not on file  . Transportation needs:    Medical: Not on file    Non-medical: Not on file  Tobacco Use  . Smoking status: Former Smoker    Packs/day: 1.00    Years: 15.00    Pack years: 15.00    Types: Cigarettes    Last attempt to quit: 01/06/1989    Years since quitting: 28.8  . Smokeless tobacco: Former Neurosurgeon    Types: Snuff  . Tobacco comment: chewed some in early 05/24/2022  Substance and Sexual Activity  . Alcohol use: No  . Drug use: No  . Sexual activity: Not on file  Lifestyle  . Physical activity:    Days per week: Not on file    Minutes per session: Not  on file  . Stress: Not on file  Relationships  . Social connections:    Talks on phone: Not on file    Gets together: Not on file    Attends religious service: Not on file    Active member of club or organization: Not on file    Attends meetings of clubs or organizations: Not on file    Relationship status: Not on file  Other Topics Concern  . Not on file  Social History Narrative   Uncle with prostate cancer?     Family History: The patient's family history includes Arthritis (age of onset: 50) in his father; Cancer in his paternal uncle; Colon cancer in his father and sister; Healthy in his daughter and mother; Heart attack in his paternal grandfather; Heart disease in his paternal grandfather; Hypertension in his daughter; Other in his father; Prostate cancer in his brother. There is no history of Esophageal cancer, Stomach cancer, or Rectal cancer.  ROS:   Please see the history of present illness.     All other systems  reviewed and are negative.  EKGs/Labs/Other Studies Reviewed:      EKG:    Recent Labs: 10/13/2017: ALT 16; BUN 17; Creatinine, Ser 0.97; Hemoglobin 13.5; Platelets 202.0; Potassium 4.0; Sodium 140  Recent Lipid Panel    Component Value Date/Time   CHOL 114 10/13/2017 0752   TRIG 51.0 10/13/2017 0752   HDL 45.40 10/13/2017 0752   CHOLHDL 3 10/13/2017 0752   VLDL 10.2 10/13/2017 0752   LDLCALC 59 10/13/2017 0752   LDLDIRECT 95 11/04/2013 1038    Physical Exam:    VS:  BP 130/74   Pulse 67   Ht 5\' 11"  (1.803 m)   Wt 200 lb (90.7 kg)   SpO2 98%   BMI 27.89 kg/m     Wt Readings from Last 3 Encounters:  11/11/17 200 lb (90.7 kg)  10/09/17 195 lb 6.4 oz (88.6 kg)  08/13/16 208 lb (94.3 kg)     GEN:  Well nourished, well developed in no acute distress HEENT: Normal NECK: No JVD; No carotid bruits LYMPHATICS: No lymphadenopathy CARDIAC: Rate S1-S2.  He has a 2/6 to 3/6 systolic ejection murmur radiating to the upper right sternal border.  He also has radiation to the axilla. RESPIRATORY:  Clear to auscultation without rales, wheezing or rhonchi  ABDOMEN: Soft, non-tender, non-distended MUSCULOSKELETAL:  No edema; No deformity  SKIN: Warm and dry NEUROLOGIC:  Alert and oriented x 3 PSYCHIATRIC:  Normal affect   ASSESSMENT:    1. Aortic valve stenosis, etiology of cardiac valve disease unspecified    PLAN:    In order of problems listed above:  1. Aortic stenosis: Patient had a right aortic stenosis echocardiogram in 2015.  He has not had any worsening symptoms but his medical doctor wanted him to be reevaluated.  We will repeat his echocardiogram.  We will see him again in 1 year.    Medication Adjustments/Labs and Tests Ordered: Current medicines are reviewed at length with the patient today.  Concerns regarding medicines are outlined above.  Orders Placed This Encounter  Procedures  . ECHOCARDIOGRAM COMPLETE   No orders of the defined types were placed in  this encounter.   Patient Instructions  Medication Instructions:  Your physician recommends that you continue on your current medications as directed. Please refer to the Current Medication list given to you today.  If you need a refill on your cardiac medications before your next appointment, please  call your pharmacy.    Lab work: None Ordered    Testing/Procedures: Your physician has requested that you have an echocardiogram. Echocardiography is a painless test that uses sound waves to create images of your heart. It provides your doctor with information about the size and shape of your heart and how well your heart's chambers and valves are working. This procedure takes approximately one hour. There are no restrictions for this procedure.    Follow-Up: At [facility], you and your health needs are our priority.  As part of our continuing mission to provide you with exceptional heart care, we have created designated Provider Care Teams.  These Care Teams include your primary Cardiologist (physician) and Advanced Practice Providers (APPs -  Physician Assistants and Nurse Practitioners) who all work together to provide you with the care you need, when you need it. You will need a follow up appointment in:  1 years.  Please call our office 2 months in advance to schedule this appointment.  You may see Kristeen Miss, MD or one of the following Advanced Practice Providers on your designated Care Team: Tereso Newcomer, PA-C Vin [place], [place] . Berton Bon, NP      Signed, Kristeen Miss, MD  11/11/2017 6:10 PM    [facility]

## 2017-11-13 NOTE — Addendum Note (Signed)
Addended by: Jacqlyn Krauss on: 11/13/2017 09:33 AM   Modules accepted: Orders

## 2017-11-25 ENCOUNTER — Other Ambulatory Visit: Payer: Self-pay

## 2017-11-25 ENCOUNTER — Ambulatory Visit (HOSPITAL_COMMUNITY): Payer: PPO | Attending: Cardiology

## 2017-11-25 DIAGNOSIS — I350 Nonrheumatic aortic (valve) stenosis: Secondary | ICD-10-CM | POA: Diagnosis not present

## 2017-11-25 MED ORDER — PERFLUTREN LIPID MICROSPHERE
1.0000 mL | INTRAVENOUS | Status: AC | PRN
  Administered 2017-11-25: 2 mL via INTRAVENOUS

## 2017-11-26 ENCOUNTER — Telehealth: Payer: Self-pay | Admitting: Nurse Practitioner

## 2017-11-26 DIAGNOSIS — I350 Nonrheumatic aortic (valve) stenosis: Secondary | ICD-10-CM

## 2017-11-26 NOTE — Telephone Encounter (Signed)
-----   Message from Vesta MixerPhilip J Nahser, MD sent at 11/25/2017  5:33 PM EST ----- His aortic stenosis is now in the severe range. He was not having any symptoms at his last office visit  Lets see him in 6 months. Lets get a repeat echo 1 week prior to office visit

## 2017-11-26 NOTE — Telephone Encounter (Signed)
Results and plan of care reviewed with patient who verbalized understanding. He is aware our office will call him to schedule another echo for May 2020 and appointment with Dr. Elease HashimotoNahser afterwards. He agrees to call back with any symptoms of AS or with questions or concerns. He thanked me for the call.  Echo order placed and recall for appointment after echo

## 2017-12-10 DIAGNOSIS — R972 Elevated prostate specific antigen [PSA]: Secondary | ICD-10-CM | POA: Diagnosis not present

## 2017-12-10 DIAGNOSIS — N400 Enlarged prostate without lower urinary tract symptoms: Secondary | ICD-10-CM | POA: Diagnosis not present

## 2017-12-18 ENCOUNTER — Other Ambulatory Visit: Payer: Self-pay

## 2017-12-18 DIAGNOSIS — I6523 Occlusion and stenosis of bilateral carotid arteries: Secondary | ICD-10-CM

## 2017-12-21 ENCOUNTER — Other Ambulatory Visit: Payer: Self-pay | Admitting: Medical

## 2017-12-21 DIAGNOSIS — I739 Peripheral vascular disease, unspecified: Principal | ICD-10-CM

## 2017-12-21 DIAGNOSIS — I350 Nonrheumatic aortic (valve) stenosis: Secondary | ICD-10-CM

## 2017-12-21 DIAGNOSIS — I10 Essential (primary) hypertension: Secondary | ICD-10-CM

## 2017-12-21 DIAGNOSIS — E785 Hyperlipidemia, unspecified: Secondary | ICD-10-CM

## 2017-12-21 DIAGNOSIS — R0989 Other specified symptoms and signs involving the circulatory and respiratory systems: Secondary | ICD-10-CM

## 2017-12-21 DIAGNOSIS — I779 Disorder of arteries and arterioles, unspecified: Secondary | ICD-10-CM

## 2017-12-24 ENCOUNTER — Other Ambulatory Visit: Payer: Self-pay | Admitting: Medical

## 2018-01-12 ENCOUNTER — Ambulatory Visit (INDEPENDENT_AMBULATORY_CARE_PROVIDER_SITE_OTHER): Payer: PPO | Admitting: Vascular Surgery

## 2018-01-12 ENCOUNTER — Ambulatory Visit (HOSPITAL_COMMUNITY)
Admission: RE | Admit: 2018-01-12 | Discharge: 2018-01-12 | Disposition: A | Discharge status: Home / Self Care | Payer: PPO | Source: Ambulatory Visit | Attending: Vascular Surgery | Admitting: Vascular Surgery

## 2018-01-12 ENCOUNTER — Other Ambulatory Visit: Payer: Self-pay

## 2018-01-12 ENCOUNTER — Encounter: Payer: Self-pay | Admitting: Vascular Surgery

## 2018-01-12 VITALS — BP 113/67 | HR 67 | Temp 97.1°F | Resp 16 | Ht 71.0 in | Wt 200.0 lb

## 2018-01-12 DIAGNOSIS — I6523 Occlusion and stenosis of bilateral carotid arteries: Secondary | ICD-10-CM

## 2018-01-12 NOTE — Progress Notes (Signed)
Patient name: Jerry Ferguson MRN: [id number] DOB: Aug 05, 1945 Sex: male  REASON FOR CONSULT: Carotid evaluation  HPI: Jerry Ferguson is a 73 y.o. male, with multiple medical problems including newly diagnosed severe aortic stenosis, hypertension, hyperlipidemia as well as known carotid disease that presents for new evaluation and to reestablish care.  He was previously previously followed by Dr. Imogene Burn and was last seen 07/15/2013 when he had a 60 to 79% stenosis of his right ICA and a 40 to 59% stenosis of the left ICA (both asymptomatic).  He was offered 51-month surveillance but never followed up.  States has had no neurologic issues including no unilateral vision loss or numbness or weakness in arm or leg since last visit in 2013/05/16.  He reports no other new issues aside from his diagnosis of severe aortic stenosis after seeing his cardiologist-Dr. Elease Hashimoto in November.  Patient states he is not taking an aspirin but does take a statin daily.  He denies any previous neck surgeries.  Past Medical History:  Diagnosis Date  . Abnormal liver function test   . Acute bronchitis   . Allergy   . Aortic valve disorders   . Broken ankle    Right  . Cardiac murmur   . Carotid artery occlusion    Bilateral Bruit  . Cataract   . Chest pain   . Chicken pox   . Chronic rhinitis   . DJD (degenerative joint disease)   . GERD (gastroesophageal reflux disease)   . History of UTI   . Hypercholesteremia   . Hypertension   . Lumbar back pain   . Overweight(278.02)     Past Surgical History:  Procedure Laterality Date  . APPENDECTOMY    . COLONOSCOPY      Family History  Problem Relation Age of Onset  . Colon cancer Father        metastatic  . Other Father        DJD  . Arthritis Father 10  . Healthy Mother        Living-87  . Prostate cancer Brother   . Heart attack Paternal Grandfather   . Heart disease Paternal Grandfather   . Cancer Paternal Uncle   . Colon cancer Sister   .  Hypertension Daughter        x1  . Healthy Daughter        x1  . Esophageal cancer Neg Hx   . Stomach cancer Neg Hx   . Rectal cancer Neg Hx     SOCIAL HISTORY: Social History   Socioeconomic History  . Marital status: Married    Spouse name: stacey(deceased May 16, 2009)  . Number of children: 1  . Years of education: Not on file  . Highest education level: Not on file  Occupational History  . Occupation: welder  Social Needs  . Financial resource strain: Not on file  . Food insecurity:    Worry: Not on file    Inability: Not on file  . Transportation needs:    Medical: Not on file    Non-medical: Not on file  Tobacco Use  . Smoking status: Former Smoker    Packs/day: 1.00    Years: 15.00    Pack years: 15.00    Types: Cigarettes    Last attempt to quit: 01/06/1989    Years since quitting: 29.0  . Smokeless tobacco: Former Neurosurgeon    Types: Snuff  . Tobacco comment: chewed some in early May 17, 2022  Substance and  Sexual Activity  . Alcohol use: No  . Drug use: No  . Sexual activity: Not on file  Lifestyle  . Physical activity:    Days per week: Not on file    Minutes per session: Not on file  . Stress: Not on file  Relationships  . Social connections:    Talks on phone: Not on file    Gets together: Not on file    Attends religious service: Not on file    Active member of club or organization: Not on file    Attends meetings of clubs or organizations: Not on file    Relationship status: Not on file  . Intimate partner violence:    Fear of current or ex partner: Not on file    Emotionally abused: Not on file    Physically abused: Not on file    Forced sexual activity: Not on file  Other Topics Concern  . Not on file  Social History Narrative   Uncle with prostate cancer?    No Known Allergies  Current Outpatient Medications  Medication Sig Dispense Refill  . atorvastatin (LIPITOR) 20 MG tablet Take 1 tablet (20 mg total) by mouth daily. 90 tablet 0  . lisinopril  (PRINIVIL,ZESTRIL) 5 MG tablet TAKE 1 TABLET BY MOUTH EVERY DAY 90 tablet 1  . cyclobenzaprine (FLEXERIL) 10 MG tablet Take 1 tablet (10 mg total) by mouth at bedtime. (Patient not taking: Reported on 01/12/2018) 30 tablet 0  . fluticasone (FLONASE) 50 MCG/ACT nasal spray Place 2 sprays into both nostrils as needed. (Patient not taking: Reported on 01/12/2018) 16 g 11   No current facility-administered medications for this visit.     REVIEW OF SYSTEMS:  [X]  denotes positive finding, [ ]  denotes negative finding Cardiac  Comments:  Chest pain or chest pressure:    Shortness of breath upon exertion:    Short of breath when lying flat:    Irregular heart rhythm:        Vascular    Pain in calf, thigh, or hip brought on by ambulation:    Pain in feet at night that wakes you up from your sleep:     Blood clot in your veins:    Leg swelling:         Pulmonary    Oxygen at home:    Productive cough:     Wheezing:         Neurologic    Sudden weakness in arms or legs:     Sudden numbness in arms or legs:     Sudden onset of difficulty speaking or slurred speech:    Temporary loss of vision in one eye:     Problems with dizziness:         Gastrointestinal    Blood in stool:     Vomited blood:         Genitourinary    Burning when urinating:     Blood in urine:        Psychiatric    Major depression:         Hematologic    Bleeding problems:    Problems with blood clotting too easily:        Skin    Rashes or ulcers:        Constitutional    Fever or chills:      PHYSICAL EXAM: Vitals:   01/12/18 1353 01/12/18 1356  BP: 113/64 113/67  Pulse: 67   Resp: 16  Temp: (!) 97.1 F (36.2 C)   TempSrc: Oral   SpO2: 97%   Weight: 200 lb (90.7 kg)   Height: 5\' 11"  (1.803 m)     GENERAL: The patient is a well-nourished male, in no acute distress. The vital signs are documented above. CARDIAC: There is a regular rate and rhythm.  VASCULAR:  Right carotid bruit  audible No left carotid bruit 2+ bilateral radial pulses palpable 2+ bilateral femoral pulses palpable PULMONARY: There is good air exchange bilaterally without wheezing or rales. ABDOMEN: Soft and non-tender with normal pitched bowel sounds.  MUSCULOSKELETAL: There are no major deformities or cyanosis. NEUROLOGIC: No focal weakness or paresthesias are detected.  CN II-XII grossly intact. SKIN: There are no ulcers or rashes noted. PSYCHIATRIC: The patient has a normal affect.  DATA:   I independently reviewed his carotid duplex that shows a high-grade greater than 80% stenosis of the right ICA with a velocity 401/159.  Left ICA with 40 to 59% stenosis.  Assessment/Plan:  73 year old male with newly diagnosed severe aortic stenosis that presents with high-grade asymptomatic right internal carotid artery stenosis.  Discussed with patient in detail that we would recommend carotid intervention in the setting of asymptomatic high-grade disease >80%.  Ultimately would initially get cardiac clearance given his recent diagnosis of severe aortic stenosis.  Looks like the notes from Dr. Harvie BridgeNahser's office were to follow-up again in 6 months given that he was asymptomatic at his evaluation in November.  Ultimately the patient does not want any surgery at this time and states he has a lot going on with several recent deaths in his family and he just cannot have surgery at this time.  I quoted him a 11% risk of stroke over the next 5 years decreased to less than 5% with carotid intervention.  As an alternative I did offer a TCAR (trans-carotid artery revascularization/carotid stent) potentially awake in the OR if he cannot tolerate a general anesthetic given his aortic valve disease.  Even with this alternative patient states he cannot have surgery at this time and would rather follow-up in 3 months to rediscuss and wants time to talk with his family.  I instructed that he needs to restart a aspirin daily as well  as the statin he is currently taking.   Cephus Shellinghristopher J. Mcdaniel Ohms, MD [facility] Office: [phone] Pager: [phone]   Cephus Shellinghristopher J Leoda Smithhart

## 2018-01-20 ENCOUNTER — Telehealth: Payer: Self-pay | Admitting: Cardiovascular Disease

## 2018-01-20 NOTE — Telephone Encounter (Signed)
° ° °  ° °  [facility] Pre-operative Risk Assessment    Request for surgical clearance:  1. What type of surgery is being performed? TCAR (trans-carotid artery revascularization/carotid stent) potentially awake in the OR if he cannot tolerate a general anesthetic given his aortic valve disease.   2. When is this surgery scheduled? TBD  3. What type of clearance is required (medical clearance vs. Pharmacy clearance to hold med vs. Both)? Both  4. Are there any medications that need to be held prior to surgery and how long?   5. Practice name and name of physician performing surgery? [facility]- Dr Carlis Abbott  6. What is your office phone number [phone] Kay   7.   What is your office fax number [phone]  8.   Anesthesia type (None, local, MAC, general) ? General is possible   Laurier Nancy 01/20/2018, 3:36 PM  _________________________________________________________________   (provider comments below)

## 2018-01-21 NOTE — Telephone Encounter (Signed)
Tentative surgery date is 02-08-18 for either TCAR vs CEA depending on what Dr. Elease Hashimoto believes is safe for patient.

## 2018-01-26 NOTE — Telephone Encounter (Signed)
Dr Elease Hashimoto can you review? It looks like Dr Chestine Spore is looking for direction from you about which type of carotid surgery would be best for this patient.  Please respond to CV DIV PRE OP  Ulas Zuercher PA-C 01/26/2018 3:48 PM

## 2018-01-28 ENCOUNTER — Telehealth: Payer: Self-pay | Admitting: Cardiovascular Disease

## 2018-01-28 NOTE — Telephone Encounter (Signed)
See telephone encounter dated 01/28/18

## 2018-01-28 NOTE — Telephone Encounter (Signed)
Spoke with patient regarding message from Dr. Elease Hashimoto regarding patient's need for cardiac clearance from Dr. Chestine Spore for carotid stent. I advised patient it is recommended that he have an appointment with Dr. Excell Seltzer for TAVR consult. I answered patient's questions about TAVR and cardiac cath that will be scheduled for Thursday Jan. 30. Patient verbalized understanding and agreement with plan and will come in on Wed. Jan. 29 for appointment with Dr. Excell Seltzer. The patient thanked me for the call.

## 2018-01-28 NOTE — Telephone Encounter (Signed)
New Message ° ° ° ° ° ° ° ° ° °Patient returned your call °

## 2018-01-28 NOTE — Telephone Encounter (Signed)
Patient is returning call.  °

## 2018-01-28 NOTE — Telephone Encounter (Signed)
  He has a tight AV and a tight carotid artery .   We have been asked to evaluate the patient prior to carotid revascularization by Dr. Chestine Sporelark .    I have discussed the case with Dr. Excell Seltzerooper . The patient will need a cath . Dr. Excell Seltzerooper will see him next week in valve clinic and make arrangements for cath We will be able to plan the next step after the cath

## 2018-02-01 ENCOUNTER — Encounter: Payer: Self-pay | Admitting: Physician Assistant

## 2018-02-02 ENCOUNTER — Encounter: Payer: Self-pay | Admitting: Cardiovascular Disease

## 2018-02-03 ENCOUNTER — Ambulatory Visit: Payer: PPO | Admitting: Cardiovascular Disease

## 2018-02-03 ENCOUNTER — Encounter: Payer: Self-pay | Admitting: Cardiovascular Disease

## 2018-02-03 VITALS — BP 126/82 | HR 59 | Ht 71.0 in | Wt 202.4 lb

## 2018-02-03 DIAGNOSIS — I350 Nonrheumatic aortic (valve) stenosis: Secondary | ICD-10-CM

## 2018-02-03 LAB — BASIC METABOLIC PANEL
BUN / CREAT RATIO: 12 (ref 10–24)
BUN: 12 mg/dL (ref 8–27)
CO2: 26 mmol/L (ref 20–29)
Calcium: 10.2 mg/dL (ref 8.6–10.2)
Chloride: 104 mmol/L (ref 96–106)
Creatinine, Ser: 1.03 mg/dL (ref 0.76–1.27)
GFR calc Af Amer: 84 mL/min/{1.73_m2} (ref >59)
GFR, EST NON AFRICAN AMERICAN: 72 mL/min/{1.73_m2} (ref >59)
Glucose: 98 mg/dL (ref 65–99)
POTASSIUM: 4.4 mmol/L (ref 3.5–5.2)
Sodium: 141 mmol/L (ref 134–144)

## 2018-02-03 LAB — CBC WITH DIFFERENTIAL/PLATELET
Basophils Absolute: 0.1 10*3/uL (ref 0.0–0.2)
Basos: 1 %
EOS (ABSOLUTE): 0.3 10*3/uL (ref 0.0–0.4)
Eos: 4 %
Hematocrit: 40.8 % (ref 37.5–51.0)
Hemoglobin: 14.1 g/dL (ref 13.0–17.7)
LYMPHS ABS: 2.3 10*3/uL (ref 0.7–3.1)
Lymphs: 28 %
MCH: 29 pg (ref 26.6–33.0)
MCHC: 34.6 g/dL (ref 31.5–35.7)
MCV: 84 fL (ref 79–97)
Monocytes Absolute: 0.7 10*3/uL (ref 0.1–0.9)
Monocytes: 8 %
Neutrophils Absolute: 4.9 10*3/uL (ref 1.4–7.0)
Neutrophils: 59 %
Platelets: 210 10*3/uL (ref 150–450)
RBC: 4.87 x10E6/uL (ref 4.14–5.80)
RDW: 14.5 % (ref 11.6–15.4)
WBC: 8.2 10*3/uL (ref 3.4–10.8)

## 2018-02-03 NOTE — Patient Instructions (Addendum)
Medication Instructions:  Your provider recommends that you continue on your current medications as directed. Please refer to the Current Medication list given to you today.    Labwork: TODAY: BMET, CBC  Testing/Procedures: Your physician has requested that you have a cardiac catheterization. Cardiac catheterization is used to diagnose and/or treat various heart conditions. Doctors may recommend this procedure for a number of different reasons. The most common reason is to evaluate chest pain. Chest pain can be a symptom of coronary artery disease (CAD), and cardiac catheterization can show whether plaque is narrowing or blocking your heart's arteries. This procedure is also used to evaluate the valves, as well as measure the blood flow and oxygen levels in different parts of your heart. For further information please visit [email or web address] Please follow instruction sheet, as given.  Follow-Up: We will contact you for further appointments.  Any Other Special Instructions Will Be Listed Below (If Applicable).    [facility] CARDIOVASCULAR DIVISION [facility] [address] OFFICE [address] [place] [address] Dept: [phone] Loc: [phone]  Jerry Ferguson  02/03/2018  You are scheduled for a Cardiac Catheterization on Thursday, January 30 with Dr. Tonny Bollman.  1. Please arrive at the [facility] (Main Entrance A) at [facility]: [address] [place], [place] [address] at 8:30 AM (This time is two hours before your procedure to ensure your preparation). Free valet parking service is available.   Special note: Every effort is made to have your procedure done on time. Please understand that emergencies sometimes delay scheduled procedures.  2. Diet: Do not eat solid foods after midnight.  The patient may have clear liquids until 5am upon the day of the procedure.  3. Labs: TODAY!  4. Medication instructions in  preparation for your procedure:  1) MAKE SURE TO TAKE ASPIRIN 81 mg the morning of your catheterization  2) you may take your other medications as directed with sips of water.  5. Plan for one night stay--bring personal belongings. 6. Bring a current list of your medications and current insurance cards. 7. You MUST have a responsible person to drive you home. 8. Someone MUST be with you the first 24 hours after you arrive home or your discharge will be delayed. 9. Please wear clothes that are easy to get on and off and wear slip-on shoes.  Thank you for allowing [place] to care for you!   -- [place] [facility]

## 2018-02-03 NOTE — Progress Notes (Signed)
Cardiology Office Note:    Date:  02/03/2018   ID:  MORGAN RENNERT, DOB 1945-06-13, MRN [id number]  PCP:  Esperanza Richters, PA-C  Cardiologist:  Kristeen Miss, MD  Electrophysiologist:  None   Referring MD: Marisue Brooklyn   Chief Complaint  Patient presents with  . Shortness of Breath    History of Present Illness:    Jerry Ferguson is a 73 y.o. male referred by Dr Elease Hashimoto for evaluation of severe aortic stenosis.  The patient was diagnosed with aortic stenosis in 2015 on an echocardiogram demonstrated moderate aortic valve stenosis with peak and mean gradients of 41 and 23 mmHg, respectively.  He was lost to cardiology follow-up but returned a few months ago after being referred back by his primary care physician.  An echocardiogram was updated and demonstrated progressive and now severe aortic stenosis with peak and mean gradients of 93 and 50 mmHg, respectively.  LV function remains vigorous.  He also had been previously noted to have moderate carotid stenosis in his carotid duplex was updated, demonstrating severe right internal carotid artery stenosis with peak velocities of 401/1 159 cm/s.  The left internal carotid artery velocities remain consistent with moderate stenosis in the 40 to 59% range.   The patient is here with his daughter today. He's had a lot of personal issues over the last few years and he hasn't been able to consistently take his medications or maintain close medical follow-up. His sister, brother, and mother have all passed away over the past few years.   The patient admits to dyspnea with moderate physical exertion.  He has not been very active of late.  He continues to work and recently at his job he got in a hurry in the cold weather and described burning in his chest with this.  It resolved with rest.  He attributed this to breathing cold air.  He denies any recent episodes of lightheadedness or syncope.  He denies orthopnea, PND, heart palpitations, or  claudication symptoms.  He denies cough.  He has had no stroke or TIA symptoms.  Mild shortness of breath is been present for the past few months.  Past Medical History:  Diagnosis Date  . Abnormal liver function test   . Acute bronchitis   . Allergy   . Aortic valve disorders   . Broken ankle    Right  . Cardiac murmur   . Carotid artery occlusion    Bilateral Bruit  . Cataract   . Chicken pox   . Chronic rhinitis   . DJD (degenerative joint disease)   . GERD (gastroesophageal reflux disease)   . History of UTI   . Hypercholesteremia   . Hypertension   . Lumbar back pain   . Overweight(278.02)     Past Surgical History:  Procedure Laterality Date  . APPENDECTOMY    . COLONOSCOPY      Current Medications: Current Meds  Medication Sig  . acetaminophen (TYLENOL) 650 MG CR tablet Take 650-1,300 mg by mouth every 8 (eight) hours as needed for pain (legs cramps.).  Marland Kitchen aspirin EC 81 MG tablet Take 81 mg by mouth daily.  Marland Kitchen atorvastatin (LIPITOR) 20 MG tablet Take 1 tablet (20 mg total) by mouth daily.  . cyclobenzaprine (FLEXERIL) 10 MG tablet Take 10 mg by mouth 3 (three) times daily as needed for muscle spasms.  . fluticasone (FLONASE) 50 MCG/ACT nasal spray Place 2 sprays into both nostrils as needed.  Marland Kitchen lisinopril (PRINIVIL,ZESTRIL) 5  MG tablet TAKE 1 TABLET BY MOUTH EVERY DAY     Allergies:   Patient has no known allergies.   Social History   Socioeconomic History  . Marital status: Married    Spouse name: stacey(deceased 05-25-09)  . Number of children: 1  . Years of education: Not on file  . Highest education level: Not on file  Occupational History  . Occupation: welder  Social Needs  . Financial resource strain: Not on file  . Food insecurity:    Worry: Not on file    Inability: Not on file  . Transportation needs:    Medical: Not on file    Non-medical: Not on file  Tobacco Use  . Smoking status: Former Smoker    Packs/day: 1.00    Years: 15.00    Pack  years: 15.00    Types: Cigarettes    Last attempt to quit: 01/06/1989    Years since quitting: 29.0  . Smokeless tobacco: Former Neurosurgeon    Types: Snuff  . Tobacco comment: chewed some in early May 26, 2022  Substance and Sexual Activity  . Alcohol use: No  . Drug use: No  . Sexual activity: Not on file  Lifestyle  . Physical activity:    Days per week: Not on file    Minutes per session: Not on file  . Stress: Not on file  Relationships  . Social connections:    Talks on phone: Not on file    Gets together: Not on file    Attends religious service: Not on file    Active member of club or organization: Not on file    Attends meetings of clubs or organizations: Not on file    Relationship status: Not on file  Other Topics Concern  . Not on file  Social History Narrative   Uncle with prostate cancer?     Family History: The patient's family history includes Arthritis (age of onset: 77) in his father; Cancer in his paternal uncle; Colon cancer in his father and sister; Healthy in his daughter and mother; Heart attack in his paternal grandfather; Heart disease in his paternal grandfather; Hypertension in his daughter; Other in his father; Prostate cancer in his brother. There is no history of Esophageal cancer, Stomach cancer, or Rectal cancer.  ROS:   Please see the history of present illness.    Positive for left shoulder pain and restricted range of motion.  All other systems reviewed and are negative.  EKGs/Labs/Other Studies Reviewed:    The following studies were reviewed today: Echo 11-25-2017: Left ventricle:  The cavity size was normal. Systolic function was vigorous. The estimated ejection fraction was in the range of 65% to 70%. Wall motion was normal; there were no regional wall motion abnormalities. GLS: -12.3% Doppler parameters are consistent with abnormal left ventricular relaxation (grade 1  diastolic dysfunction).  ------------------------------------------------------------------- Aortic valve:  Poorly visualized.  Trileaflet; severely thickened, severely calcified leaflets. Valve mobility was restricted. Doppler:   There was severe stenosis.   There was trivial regurgitation.    VTI ratio of LVOT to aortic valve: 0.25. Valve area (VTI): 0.81 cm^2. Indexed valve area (VTI): 0.38 cm^2/m^2. Peak velocity ratio of LVOT to aortic valve: 0.21. Valve area (Vmax): 0.64 cm^2. Indexed valve area (Vmax): 0.3 cm^2/m^2. Mean velocity ratio of LVOT to aortic valve: 0.22. Valve area (Vmean): 0.7 cm^2. Indexed valve area (Vmean): 0.32 cm^2/m^2.    Mean gradient (S): 50 mm Hg. Peak gradient (S): 93 mm Hg.  -------------------------------------------------------------------  Aorta:  Ascending aorta: The ascending aorta was mildly dilated.  ------------------------------------------------------------------- Mitral valve:   Structurally normal valve.   Mobility was not restricted.  Doppler:  Transvalvular velocity was within the normal range. There was no evidence for stenosis. There was no regurgitation.    Valve area by pressure half-time: 2.86 cm^2. Indexed valve area by pressure half-time: 1.33 cm^2/m^2.    Peak gradient (D): 3 mm Hg.  ------------------------------------------------------------------- Left atrium:  The atrium was normal in size.  ------------------------------------------------------------------- Right ventricle:  The cavity size was normal. Wall thickness was normal. Systolic function was normal.  ------------------------------------------------------------------- Pulmonic valve:    Doppler:  Transvalvular velocity was within the normal range. There was no evidence for stenosis.  ------------------------------------------------------------------- Tricuspid valve:   Structurally normal valve.    Doppler: Transvalvular velocity was within the normal  range. There was no regurgitation.  ------------------------------------------------------------------- Pulmonary artery:   The main pulmonary artery was normal-sized. Systolic pressure was within the normal range.  ------------------------------------------------------------------- Right atrium:  The atrium was normal in size.  ------------------------------------------------------------------- Pericardium:  There was no pericardial effusion.  ------------------------------------------------------------------- Systemic veins: Inferior vena cava: The vessel was normal in size.  ------------------------------------------------------------------- Measurements   Left ventricle                            Value          Reference  LV ID, ED, PLAX chordal           (L)     40    mm       43 - 52  LV ID, ES, PLAX chordal                   26    mm       23 - 38  LV fx shortening, PLAX chordal            35    %        >=29  LV PW thickness, ED                       14    mm       ---------  IVS/LV PW ratio, ED                       1.07           <=1.3  Stroke volume, 2D                         90    ml       ---------  Stroke volume/bsa, 2D                     42    ml/m^2   ---------  LV e&', lateral                            6.08  cm/s     ---------  LV E/e&', lateral                          13.88          ---------  LV e&', medial  4.43  cm/s     ---------  LV E/e&', medial                           19.05          ---------  LV e&', average                            5.26  cm/s     ---------  LV E/e&', average                          16.06          ---------    Ventricular septum                        Value          Reference  IVS thickness, ED                         15    mm       ---------    LVOT                                      Value          Reference  LVOT ID, S                                20    mm       ---------  LVOT area                                  3.14  cm^2     ---------  LVOT peak velocity, S                     101   cm/s     ---------  LVOT mean velocity, S                     73.8  cm/s     ---------  LVOT VTI, S                               28.8  cm       ---------    Aortic valve                              Value          Reference  Aortic valve peak velocity, S             481   cm/s     ---------  Aortic valve mean velocity, S             333   cm/s     ---------  Aortic valve VTI, S                       115   cm       ---------  Aortic  mean gradient, S                   50    mm Hg    ---------  Aortic peak gradient, S                   93    mm Hg    ---------  VTI ratio, LVOT/AV                        0.25           ---------  Aortic valve area, VTI                    0.81  cm^2     ---------  Aortic valve area/bsa, VTI                0.38  cm^2/m^2 ---------  Velocity ratio, peak, LVOT/AV             0.21           ---------  Aortic valve area, peak velocity          0.64  cm^2     ---------  Aortic valve area/bsa, peak               0.3   cm^2/m^2 ---------  velocity  Velocity ratio, mean, LVOT/AV             0.22           ---------  Aortic valve area, mean velocity          0.7   cm^2     ---------  Aortic valve area/bsa, mean               0.32  cm^2/m^2 ---------  velocity    Aorta                                     Value          Reference  Aortic root ID, ED                        34    mm       ---------  Ascending aorta ID, A-P, S                39    mm       ---------    Left atrium                               Value          Reference  LA ID, A-P, ES                            37    mm       ---------  LA ID/bsa, A-P                            1.72  cm/m^2   <=2.2  LA volume, S                              65.7  ml       ---------  LA volume/bsa, S                          30.6  ml/m^2   ---------  LA volume, ES, 1-p A4C                    73    ml       ---------  LA  volume/bsa, ES, 1-p A4C                34    ml/m^2   ---------  LA volume, ES, 1-p A2C                    54.6  ml       ---------  LA volume/bsa, ES, 1-p A2C                25.4  ml/m^2   ---------    Mitral valve                              Value          Reference  Mitral E-wave peak velocity               84.4  cm/s     ---------  Mitral A-wave peak velocity               83.6  cm/s     ---------  Mitral deceleration time          (H)     264   ms       150 - 230  Mitral pressure half-time                 77    ms       ---------  Mitral peak gradient, D                   3     mm Hg    ---------  Mitral E/A ratio, peak                    1              ---------  Mitral valve area, PHT, DP                2.86  cm^2     ---------  Mitral valve area/bsa, PHT, DP            1.33  cm^2/m^2 ---------    Systemic veins                            Value          Reference  Estimated CVP                             3     mm Hg    ---------    Right ventricle                           Value          Reference  TAPSE  19.8  mm       ---------  RV s&', lateral, S                         9.45  cm/s     ---------   EKG:  EKG is ordered today.  The ekg ordered today demonstrates normal sinus rhythm 59 bpm, T wave abnormality consider lateral ischemia.  Recent Labs: 10/13/2017: ALT 16; BUN 17; Creatinine, Ser 0.97; Hemoglobin 13.5; Platelets 202.0; Potassium 4.0; Sodium 140  Recent Lipid Panel    Component Value Date/Time   CHOL 114 10/13/2017 0752   TRIG 51.0 10/13/2017 0752   HDL 45.40 10/13/2017 0752   CHOLHDL 3 10/13/2017 0752   VLDL 10.2 10/13/2017 0752   LDLCALC 59 10/13/2017 0752   LDLDIRECT 95 11/04/2013 1038    Physical Exam:    VS:  BP 126/82   Pulse (!) 59   Ht 5\' 11"  (1.803 m)   Wt 202 lb 6.4 oz (91.8 kg)   SpO2 98%   BMI 28.23 kg/m     Wt Readings from Last 3 Encounters:  02/03/18 202 lb 6.4 oz (91.8 kg)  01/12/18 200 lb (90.7  kg)  11/11/17 200 lb (90.7 kg)     GEN: Well nourished, well developed in no acute distress HEENT: Normal NECK: No JVD; BL carotid bruits R>L LYMPHATICS: No lymphadenopathy CARDIAC: RRR, grade 3/6 harsh late peaking crescendo decrescendo murmur at the right upper sternal border RESPIRATORY:  Clear to auscultation without rales, wheezing or rhonchi  ABDOMEN: Soft, non-tender, non-distended MUSCULOSKELETAL:  No edema; No deformity  SKIN: Warm and dry NEUROLOGIC:  Alert and oriented x 3 PSYCHIATRIC:  Normal affect   STS Risk Calculator: Risk of Mortality: 0.797% Renal Failure: 1.058% Permanent Stroke: 1.887% Prolonged Ventilation: 3.710% DSW Infection: 0.088% Reoperation: 3.549% Morbidity or Mortality: 6.131% Short Length of Stay: 57.222% Long Length of Stay: 2.217%  ASSESSMENT:    1. Severe aortic stenosis    PLAN:    In order of problems listed above:  1. The patient presents with severe, Stage D1, aortic stenosis and severe asymptomatic right carotid stenosis. I have personally reviewed his echo study outlined above, demonstrating severe calcification and restriction of the aortic valve leaflets with hemodynamic findings clearly consistent with severe aortic stenosis (mean transvalvular gradient 50 mmHg).  Symptoms include mild exertional dyspnea and no recent episodes concerning for exertional angina. I have reviewed the natural history of aortic stenosis with the patient and his daughter who is present today. We have discussed the limitations of medical therapy and the poor prognosis associated with symptomatic aortic stenosis. We have reviewed potential treatment options, including palliative medical therapy, conventional surgical aortic valve replacement, and transcatheter aortic valve replacement. We discussed treatment options in the context of the patient's specific comorbid medical conditions.  We specifically discussed issues around treatment of asymptomatic severe right carotid  artery stenosis and timing of both carotid and aortic valve intervention.  I think next steps need to involve a cardiac catheterization and CTA studies of the heart as well as the chest, abdomen, and pelvis. I have reviewed the risks, indications, and alternatives to cardiac catheterization, possible angioplasty, and stenting with the patient. Risks include but are not limited to bleeding, infection, vascular injury, stroke, myocardial infection, arrhythmia, kidney injury, radiation-related injury in the case of prolonged fluoroscopy use, emergency cardiac surgery, and death. The patient understands the risks of serious complication is 1-2 in 1000 with diagnostic cardiac  cath and 1-2% or less with angioplasty/stenting.  Depending on these findings, we will determine the most appropriate treatment strategy for this patient.  He will undergo formal cardiac surgical consultation as part of a multidisciplinary approach to his care.  If he does not have obstructive CAD, TAVR followed by carotid endarterectomy might be a reasonable approach.  Will discuss options with Dr Chestine Sporelark and with cardiac surgery once all data is available.  The patient is advised to avoid strenuous physical activity until he is treated.   Medication Adjustments/Labs and Tests Ordered: Current medicines are reviewed at length with the patient today.  Concerns regarding medicines are outlined above.  Orders Placed This Encounter  Procedures  . CT CORONARY MORPH W/CTA COR W/SCORE W/CA W/CM &/OR WO/CM  . CT ANGIO ABDOMEN PELVIS  W &/OR WO CONTRAST  . CT ANGIO CHEST AORTA W &/OR WO CONTRAST  . Basic metabolic panel  . CBC with Differential/Platelet  . EKG 12-Lead  . Pulmonary Function Test   No orders of the defined types were placed in this encounter.   Patient Instructions  Medication Instructions:  Your provider recommends that you continue on your current medications as directed. Please refer to the Current Medication list given  to you today.    Labwork: TODAY: BMET, CBC  Testing/Procedures: Your physician has requested that you have a cardiac catheterization. Cardiac catheterization is used to diagnose and/or treat various heart conditions. Doctors may recommend this procedure for a number of different reasons. The most common reason is to evaluate chest pain. Chest pain can be a symptom of coronary artery disease (CAD), and cardiac catheterization can show whether plaque is narrowing or blocking your heart's arteries. This procedure is also used to evaluate the valves, as well as measure the blood flow and oxygen levels in different parts of your heart. For further information please visit [email or web address] Please follow instruction sheet, as given.  Follow-Up: We will contact you for further appointments.  Any Other Special Instructions Will Be Listed Below (If Applicable).    [facility] [address], [address] [place] [address] Dept: [phone] Loc: [phone]  Werner LeanCarl T Dantes  02/03/2018  You are scheduled for a Cardiac Catheterization on Thursday, January 30 with Dr. Tonny BollmanMichael Khristopher Kapaun.  1. [facility] [address] [place], [place] [address] at 8:30 AM (This time is two hours before your procedure to ensure your preparation). Free valet parking service is available.   Special note: Every effort is made to have your procedure done on time. Please understand that emergencies sometimes delay scheduled procedures.  2. Diet: Do not eat solid foods after midnight.  The patient may have clear liquids until 5am upon the day of the procedure.  3. Labs: TODAY!  4. Medication instructions in preparation for your procedure:  1) MAKE SURE TO TAKE ASPIRIN 81 mg the morning of your catheterization  2) you may take your other medications as directed with sips  of water.  5. Plan for one night stay--bring personal belongings. 6. Bring a current list of your medications and current insurance cards. 7. You MUST have a responsible person to drive you home. 8. Someone MUST be with you the first 24 hours after you arrive home or your discharge will be delayed. 9. Please wear clothes that are easy to get on and off and wear slip-on shoes.  Thank you for allowing us to care for you!   -- [place] [facility]     Signed, Tonny BollmanMichael Timiyah Romito, MD  02/03/2018 1:22 PM    [facility]

## 2018-02-03 NOTE — H&P (View-Only) (Signed)
Cardiology Office Note:    Date:  02/03/2018   ID:  Jerry Ferguson, DOB 18-Aug-1945, MRN [id number]  PCP:  Esperanza Richters, PA-C  Cardiologist:  Kristeen Miss, MD  Electrophysiologist:  None   Referring MD: Marisue Brooklyn   Chief Complaint  Patient presents with  . Shortness of Breath    History of Present Illness:    Jerry Ferguson is a 73 y.o. male referred by Dr Elease Hashimoto for evaluation of severe aortic stenosis.  The patient was diagnosed with aortic stenosis in 2015 on an echocardiogram demonstrated moderate aortic valve stenosis with peak and mean gradients of 41 and 23 mmHg, respectively.  He was lost to cardiology follow-up but returned a few months ago after being referred back by his primary care physician.  An echocardiogram was updated and demonstrated progressive and now severe aortic stenosis with peak and mean gradients of 93 and 50 mmHg, respectively.  LV function remains vigorous.  He also had been previously noted to have moderate carotid stenosis in his carotid duplex was updated, demonstrating severe right internal carotid artery stenosis with peak velocities of 401/1 159 cm/s.  The left internal carotid artery velocities remain consistent with moderate stenosis in the 40 to 59% range.   The patient is here with his daughter today. He's had a lot of personal issues over the last few years and he hasn't been able to consistently take his medications or maintain close medical follow-up. His sister, brother, and mother have all passed away over the past few years.   The patient admits to dyspnea with moderate physical exertion.  He has not been very active of late.  He continues to work and recently at his job he got in a hurry in the cold weather and described burning in his chest with this.  It resolved with rest.  He attributed this to breathing cold air.  He denies any recent episodes of lightheadedness or syncope.  He denies orthopnea, PND, heart palpitations, or  claudication symptoms.  He denies cough.  He has had no stroke or TIA symptoms.  Mild shortness of breath is been present for the past few months.  Past Medical History:  Diagnosis Date  . Abnormal liver function test   . Acute bronchitis   . Allergy   . Aortic valve disorders   . Broken ankle    Right  . Cardiac murmur   . Carotid artery occlusion    Bilateral Bruit  . Cataract   . Chicken pox   . Chronic rhinitis   . DJD (degenerative joint disease)   . GERD (gastroesophageal reflux disease)   . History of UTI   . Hypercholesteremia   . Hypertension   . Lumbar back pain   . Overweight(278.02)     Past Surgical History:  Procedure Laterality Date  . APPENDECTOMY    . COLONOSCOPY      Current Medications: Current Meds  Medication Sig  . acetaminophen (TYLENOL) 650 MG CR tablet Take 650-1,300 mg by mouth every 8 (eight) hours as needed for pain (legs cramps.).  Marland Kitchen aspirin EC 81 MG tablet Take 81 mg by mouth daily.  Marland Kitchen atorvastatin (LIPITOR) 20 MG tablet Take 1 tablet (20 mg total) by mouth daily.  . cyclobenzaprine (FLEXERIL) 10 MG tablet Take 10 mg by mouth 3 (three) times daily as needed for muscle spasms.  . fluticasone (FLONASE) 50 MCG/ACT nasal spray Place 2 sprays into both nostrils as needed.  Marland Kitchen lisinopril (PRINIVIL,ZESTRIL) 5  MG tablet TAKE 1 TABLET BY MOUTH EVERY DAY     Allergies:   Patient has no known allergies.   Social History   Socioeconomic History  . Marital status: Married    Spouse name: stacey(deceased May 20, 2009)  . Number of children: 1  . Years of education: Not on file  . Highest education level: Not on file  Occupational History  . Occupation: welder  Social Needs  . Financial resource strain: Not on file  . Food insecurity:    Worry: Not on file    Inability: Not on file  . Transportation needs:    Medical: Not on file    Non-medical: Not on file  Tobacco Use  . Smoking status: Former Smoker    Packs/day: 1.00    Years: 15.00    Pack  years: 15.00    Types: Cigarettes    Last attempt to quit: 01/06/1989    Years since quitting: 29.0  . Smokeless tobacco: Former Neurosurgeon    Types: Snuff  . Tobacco comment: chewed some in early 2022-05-21  Substance and Sexual Activity  . Alcohol use: No  . Drug use: No  . Sexual activity: Not on file  Lifestyle  . Physical activity:    Days per week: Not on file    Minutes per session: Not on file  . Stress: Not on file  Relationships  . Social connections:    Talks on phone: Not on file    Gets together: Not on file    Attends religious service: Not on file    Active member of club or organization: Not on file    Attends meetings of clubs or organizations: Not on file    Relationship status: Not on file  Other Topics Concern  . Not on file  Social History Narrative   Uncle with prostate cancer?     Family History: The patient's family history includes Arthritis (age of onset: 18) in his father; Cancer in his paternal uncle; Colon cancer in his father and sister; Healthy in his daughter and mother; Heart attack in his paternal grandfather; Heart disease in his paternal grandfather; Hypertension in his daughter; Other in his father; Prostate cancer in his brother. There is no history of Esophageal cancer, Stomach cancer, or Rectal cancer.  ROS:   Please see the history of present illness.    Positive for left shoulder pain and restricted range of motion.  All other systems reviewed and are negative.  EKGs/Labs/Other Studies Reviewed:    The following studies were reviewed today: Echo 11-25-2017: Left ventricle:  The cavity size was normal. Systolic function was vigorous. The estimated ejection fraction was in the range of 65% to 70%. Wall motion was normal; there were no regional wall motion abnormalities. GLS: -12.3% Doppler parameters are consistent with abnormal left ventricular relaxation (grade 1  diastolic dysfunction).  ------------------------------------------------------------------- Aortic valve:  Poorly visualized.  Trileaflet; severely thickened, severely calcified leaflets. Valve mobility was restricted. Doppler:   There was severe stenosis.   There was trivial regurgitation.    VTI ratio of LVOT to aortic valve: 0.25. Valve area (VTI): 0.81 cm^2. Indexed valve area (VTI): 0.38 cm^2/m^2. Peak velocity ratio of LVOT to aortic valve: 0.21. Valve area (Vmax): 0.64 cm^2. Indexed valve area (Vmax): 0.3 cm^2/m^2. Mean velocity ratio of LVOT to aortic valve: 0.22. Valve area (Vmean): 0.7 cm^2. Indexed valve area (Vmean): 0.32 cm^2/m^2.    Mean gradient (S): 50 mm Hg. Peak gradient (S): 93 mm Hg.  -------------------------------------------------------------------  Aorta:  Ascending aorta: The ascending aorta was mildly dilated.  ------------------------------------------------------------------- Mitral valve:   Structurally normal valve.   Mobility was not restricted.  Doppler:  Transvalvular velocity was within the normal range. There was no evidence for stenosis. There was no regurgitation.    Valve area by pressure half-time: 2.86 cm^2. Indexed valve area by pressure half-time: 1.33 cm^2/m^2.    Peak gradient (D): 3 mm Hg.  ------------------------------------------------------------------- Left atrium:  The atrium was normal in size.  ------------------------------------------------------------------- Right ventricle:  The cavity size was normal. Wall thickness was normal. Systolic function was normal.  ------------------------------------------------------------------- Pulmonic valve:    Doppler:  Transvalvular velocity was within the normal range. There was no evidence for stenosis.  ------------------------------------------------------------------- Tricuspid valve:   Structurally normal valve.    Doppler: Transvalvular velocity was within the normal  range. There was no regurgitation.  ------------------------------------------------------------------- Pulmonary artery:   The main pulmonary artery was normal-sized. Systolic pressure was within the normal range.  ------------------------------------------------------------------- Right atrium:  The atrium was normal in size.  ------------------------------------------------------------------- Pericardium:  There was no pericardial effusion.  ------------------------------------------------------------------- Systemic veins: Inferior vena cava: The vessel was normal in size.  ------------------------------------------------------------------- Measurements   Left ventricle                            Value          Reference  LV ID, ED, PLAX chordal           (L)     40    mm       43 - 52  LV ID, ES, PLAX chordal                   26    mm       23 - 38  LV fx shortening, PLAX chordal            35    %        >=29  LV PW thickness, ED                       14    mm       ---------  IVS/LV PW ratio, ED                       1.07           <=1.3  Stroke volume, 2D                         90    ml       ---------  Stroke volume/bsa, 2D                     42    ml/m^2   ---------  LV e&', lateral                            6.08  cm/s     ---------  LV E/e&', lateral                          13.88          ---------  LV e&', medial  4.43  cm/s     ---------  LV E/e&', medial                           19.05          ---------  LV e&', average                            5.26  cm/s     ---------  LV E/e&', average                          16.06          ---------    Ventricular septum                        Value          Reference  IVS thickness, ED                         15    mm       ---------    LVOT                                      Value          Reference  LVOT ID, S                                20    mm       ---------  LVOT area                                  3.14  cm^2     ---------  LVOT peak velocity, S                     101   cm/s     ---------  LVOT mean velocity, S                     73.8  cm/s     ---------  LVOT VTI, S                               28.8  cm       ---------    Aortic valve                              Value          Reference  Aortic valve peak velocity, S             481   cm/s     ---------  Aortic valve mean velocity, S             333   cm/s     ---------  Aortic valve VTI, S                       115   cm       ---------  Aortic  mean gradient, S                   50    mm Hg    ---------  Aortic peak gradient, S                   93    mm Hg    ---------  VTI ratio, LVOT/AV                        0.25           ---------  Aortic valve area, VTI                    0.81  cm^2     ---------  Aortic valve area/bsa, VTI                0.38  cm^2/m^2 ---------  Velocity ratio, peak, LVOT/AV             0.21           ---------  Aortic valve area, peak velocity          0.64  cm^2     ---------  Aortic valve area/bsa, peak               0.3   cm^2/m^2 ---------  velocity  Velocity ratio, mean, LVOT/AV             0.22           ---------  Aortic valve area, mean velocity          0.7   cm^2     ---------  Aortic valve area/bsa, mean               0.32  cm^2/m^2 ---------  velocity    Aorta                                     Value          Reference  Aortic root ID, ED                        34    mm       ---------  Ascending aorta ID, A-P, S                39    mm       ---------    Left atrium                               Value          Reference  LA ID, A-P, ES                            37    mm       ---------  LA ID/bsa, A-P                            1.72  cm/m^2   <=2.2  LA volume, S                              65.7  ml       ---------  LA volume/bsa, S                          30.6  ml/m^2   ---------  LA volume, ES, 1-p A4C                    73    ml       ---------  LA  volume/bsa, ES, 1-p A4C                34    ml/m^2   ---------  LA volume, ES, 1-p A2C                    54.6  ml       ---------  LA volume/bsa, ES, 1-p A2C                25.4  ml/m^2   ---------    Mitral valve                              Value          Reference  Mitral E-wave peak velocity               84.4  cm/s     ---------  Mitral A-wave peak velocity               83.6  cm/s     ---------  Mitral deceleration time          (H)     264   ms       150 - 230  Mitral pressure half-time                 77    ms       ---------  Mitral peak gradient, D                   3     mm Hg    ---------  Mitral E/A ratio, peak                    1              ---------  Mitral valve area, PHT, DP                2.86  cm^2     ---------  Mitral valve area/bsa, PHT, DP            1.33  cm^2/m^2 ---------    Systemic veins                            Value          Reference  Estimated CVP                             3     mm Hg    ---------    Right ventricle                           Value          Reference  TAPSE  19.8  mm       ---------  RV s&', lateral, S                         9.45  cm/s     ---------   EKG:  EKG is ordered today.  The ekg ordered today demonstrates normal sinus rhythm 59 bpm, T wave abnormality consider lateral ischemia.  Recent Labs: 10/13/2017: ALT 16; BUN 17; Creatinine, Ser 0.97; Hemoglobin 13.5; Platelets 202.0; Potassium 4.0; Sodium 140  Recent Lipid Panel    Component Value Date/Time   CHOL 114 10/13/2017 0752   TRIG 51.0 10/13/2017 0752   HDL 45.40 10/13/2017 0752   CHOLHDL 3 10/13/2017 0752   VLDL 10.2 10/13/2017 0752   LDLCALC 59 10/13/2017 0752   LDLDIRECT 95 11/04/2013 1038    Physical Exam:    VS:  BP 126/82   Pulse (!) 59   Ht 5\' 11"  (1.803 m)   Wt 202 lb 6.4 oz (91.8 kg)   SpO2 98%   BMI 28.23 kg/m     Wt Readings from Last 3 Encounters:  02/03/18 202 lb 6.4 oz (91.8 kg)  01/12/18 200 lb (90.7  kg)  11/11/17 200 lb (90.7 kg)     GEN: Well nourished, well developed in no acute distress HEENT: Normal NECK: No JVD; BL carotid bruits R>L LYMPHATICS: No lymphadenopathy CARDIAC: RRR, grade 3/6 harsh late peaking crescendo decrescendo murmur at the right upper sternal border RESPIRATORY:  Clear to auscultation without rales, wheezing or rhonchi  ABDOMEN: Soft, non-tender, non-distended MUSCULOSKELETAL:  No edema; No deformity  SKIN: Warm and dry NEUROLOGIC:  Alert and oriented x 3 PSYCHIATRIC:  Normal affect   STS Risk Calculator: Risk of Mortality: 0.797% Renal Failure: 1.058% Permanent Stroke: 1.887% Prolonged Ventilation: 3.710% DSW Infection: 0.088% Reoperation: 3.549% Morbidity or Mortality: 6.131% Short Length of Stay: 57.222% Long Length of Stay: 2.217%  ASSESSMENT:    1. Severe aortic stenosis    PLAN:    In order of problems listed above:  1. The patient presents with severe, Stage D1, aortic stenosis and severe asymptomatic right carotid stenosis. I have personally reviewed his echo study outlined above, demonstrating severe calcification and restriction of the aortic valve leaflets with hemodynamic findings clearly consistent with severe aortic stenosis (mean transvalvular gradient 50 mmHg).  Symptoms include mild exertional dyspnea and no recent episodes concerning for exertional angina. I have reviewed the natural history of aortic stenosis with the patient and his daughter who is present today. We have discussed the limitations of medical therapy and the poor prognosis associated with symptomatic aortic stenosis. We have reviewed potential treatment options, including palliative medical therapy, conventional surgical aortic valve replacement, and transcatheter aortic valve replacement. We discussed treatment options in the context of the patient's specific comorbid medical conditions.  We specifically discussed issues around treatment of asymptomatic severe right carotid  artery stenosis and timing of both carotid and aortic valve intervention.  I think next steps need to involve a cardiac catheterization and CTA studies of the heart as well as the chest, abdomen, and pelvis. I have reviewed the risks, indications, and alternatives to cardiac catheterization, possible angioplasty, and stenting with the patient. Risks include but are not limited to bleeding, infection, vascular injury, stroke, myocardial infection, arrhythmia, kidney injury, radiation-related injury in the case of prolonged fluoroscopy use, emergency cardiac surgery, and death. The patient understands the risks of serious complication is 1-2 in 1000 with diagnostic cardiac  cath and 1-2% or less with angioplasty/stenting.  Depending on these findings, we will determine the most appropriate treatment strategy for this patient.  He will undergo formal cardiac surgical consultation as part of a multidisciplinary approach to his care.  If he does not have obstructive CAD, TAVR followed by carotid endarterectomy might be a reasonable approach.  Will discuss options with Dr Chestine Sporelark and with cardiac surgery once all data is available.  The patient is advised to avoid strenuous physical activity until he is treated.   Medication Adjustments/Labs and Tests Ordered: Current medicines are reviewed at length with the patient today.  Concerns regarding medicines are outlined above.  Orders Placed This Encounter  Procedures  . CT CORONARY MORPH W/CTA COR W/SCORE W/CA W/CM &/OR WO/CM  . CT ANGIO ABDOMEN PELVIS  W &/OR WO CONTRAST  . CT ANGIO CHEST AORTA W &/OR WO CONTRAST  . Basic metabolic panel  . CBC with Differential/Platelet  . EKG 12-Lead  . Pulmonary Function Test   No orders of the defined types were placed in this encounter.   Patient Instructions  Medication Instructions:  Your provider recommends that you continue on your current medications as directed. Please refer to the Current Medication list given  to you today.    Labwork: TODAY: BMET, CBC  Testing/Procedures: Your physician has requested that you have a cardiac catheterization. Cardiac catheterization is used to diagnose and/or treat various heart conditions. Doctors may recommend this procedure for a number of different reasons. The most common reason is to evaluate chest pain. Chest pain can be a symptom of coronary artery disease (CAD), and cardiac catheterization can show whether plaque is narrowing or blocking your heart's arteries. This procedure is also used to evaluate the valves, as well as measure the blood flow and oxygen levels in different parts of your heart. For further information please visit [email or web address] Please follow instruction sheet, as given.  Follow-Up: We will contact you for further appointments.  Any Other Special Instructions Will Be Listed Below (If Applicable).    [facility] [address] [facility] [address], [address] [place] [address] Dept: [phone] Loc: [phone]  Werner LeanCarl T Dantes  02/03/2018  You are scheduled for a Cardiac Catheterization on Thursday, January 30 with Dr. Tonny BollmanMichael Ronin Rehfeldt.  1. [facility] [address] [place], [place] [address] at 8:30 AM (This time is two hours before your procedure to ensure your preparation). Free valet parking service is available.   Special note: Every effort is made to have your procedure done on time. Please understand that emergencies sometimes delay scheduled procedures.  2. Diet: Do not eat solid foods after midnight.  The patient may have clear liquids until 5am upon the day of the procedure.  3. Labs: TODAY!  4. Medication instructions in preparation for your procedure:  1) MAKE SURE TO TAKE ASPIRIN 81 mg the morning of your catheterization  2) you may take your other medications as directed with sips  of water.  5. Plan for one night stay--bring personal belongings. 6. Bring a current list of your medications and current insurance cards. 7. You MUST have a responsible person to drive you home. 8. Someone MUST be with you the first 24 hours after you arrive home or your discharge will be delayed. 9. Please wear clothes that are easy to get on and off and wear slip-on shoes.  Thank you for allowing us to care for you!   -- [place] [facility]     Signed, Tonny BollmanMichael Shaquoia Miers, MD  02/03/2018 1:22 PM    [facility]

## 2018-02-04 ENCOUNTER — Encounter (HOSPITAL_COMMUNITY)
Admission: RE | Disposition: A | Discharge status: Home / Self Care | Payer: Self-pay | Source: Home / Self Care | Attending: Cardiovascular Disease

## 2018-02-04 ENCOUNTER — Encounter (HOSPITAL_COMMUNITY): Payer: Self-pay | Admitting: Cardiovascular Disease

## 2018-02-04 ENCOUNTER — Telehealth (HOSPITAL_COMMUNITY): Payer: Self-pay | Admitting: Cardiology

## 2018-02-04 ENCOUNTER — Ambulatory Visit (HOSPITAL_COMMUNITY)
Admission: RE | Admit: 2018-02-04 | Discharge: 2018-02-04 | Disposition: A | Discharge status: Home / Self Care | Payer: PPO | Attending: Cardiovascular Disease | Admitting: Cardiovascular Disease

## 2018-02-04 ENCOUNTER — Other Ambulatory Visit: Payer: Self-pay

## 2018-02-04 DIAGNOSIS — R0602 Shortness of breath: Secondary | ICD-10-CM | POA: Insufficient documentation

## 2018-02-04 DIAGNOSIS — Z87891 Personal history of nicotine dependence: Secondary | ICD-10-CM | POA: Diagnosis not present

## 2018-02-04 DIAGNOSIS — Z8249 Family history of ischemic heart disease and other diseases of the circulatory system: Secondary | ICD-10-CM | POA: Insufficient documentation

## 2018-02-04 DIAGNOSIS — I6521 Occlusion and stenosis of right carotid artery: Secondary | ICD-10-CM | POA: Diagnosis not present

## 2018-02-04 DIAGNOSIS — I350 Nonrheumatic aortic (valve) stenosis: Secondary | ICD-10-CM

## 2018-02-04 DIAGNOSIS — K219 Gastro-esophageal reflux disease without esophagitis: Secondary | ICD-10-CM | POA: Insufficient documentation

## 2018-02-04 DIAGNOSIS — Z79899 Other long term (current) drug therapy: Secondary | ICD-10-CM | POA: Diagnosis not present

## 2018-02-04 DIAGNOSIS — Z7982 Long term (current) use of aspirin: Secondary | ICD-10-CM | POA: Diagnosis not present

## 2018-02-04 DIAGNOSIS — E7800 Pure hypercholesterolemia, unspecified: Secondary | ICD-10-CM | POA: Insufficient documentation

## 2018-02-04 DIAGNOSIS — I10 Essential (primary) hypertension: Secondary | ICD-10-CM | POA: Insufficient documentation

## 2018-02-04 DIAGNOSIS — I060 Rheumatic aortic stenosis: Secondary | ICD-10-CM | POA: Insufficient documentation

## 2018-02-04 DIAGNOSIS — I2510 Atherosclerotic heart disease of native coronary artery without angina pectoris: Secondary | ICD-10-CM | POA: Diagnosis not present

## 2018-02-04 HISTORY — PX: RIGHT HEART CATH: CATH118263

## 2018-02-04 HISTORY — PX: CORONARY ANGIOGRAPHY: CATH118303

## 2018-02-04 LAB — POCT I-STAT EG7
Acid-base deficit: 1 mmol/L (ref 0.0–2.0)
Bicarbonate: 24.4 mmol/L (ref 20.0–28.0)
Calcium, Ion: 1.18 mmol/L (ref 1.15–1.40)
HCT: 35 % — ABNORMAL LOW (ref 39.0–52.0)
Hemoglobin: 11.9 g/dL — ABNORMAL LOW (ref 13.0–17.0)
O2 Saturation: 75 %
Potassium: 3.9 mmol/L (ref 3.5–5.1)
SODIUM: 142 mmol/L (ref 135–145)
TCO2: 26 mmol/L (ref 22–32)
pCO2, Ven: 41.7 mmHg — ABNORMAL LOW (ref 44.0–60.0)
pH, Ven: 7.375 (ref 7.250–7.430)
pO2, Ven: 41 mmHg (ref 32.0–45.0)

## 2018-02-04 LAB — POCT I-STAT 7, (LYTES, BLD GAS, ICA,H+H)
Acid-base deficit: 1 mmol/L (ref 0.0–2.0)
Bicarbonate: 23.8 mmol/L (ref 20.0–28.0)
CALCIUM ION: 1.21 mmol/L (ref 1.15–1.40)
HCT: 35 % — ABNORMAL LOW (ref 39.0–52.0)
Hemoglobin: 11.9 g/dL — ABNORMAL LOW (ref 13.0–17.0)
O2 Saturation: 96 %
PH ART: 7.399 (ref 7.350–7.450)
Potassium: 4 mmol/L (ref 3.5–5.1)
Sodium: 141 mmol/L (ref 135–145)
TCO2: 25 mmol/L (ref 22–32)
pCO2 arterial: 38.4 mmHg (ref 32.0–48.0)
pO2, Arterial: 84 mmHg (ref 83.0–108.0)

## 2018-02-04 SURGERY — RIGHT HEART CATH

## 2018-02-04 MED ORDER — IOPAMIDOL (ISOVUE-370) INJECTION 76%
INTRAVENOUS | Status: DC | PRN
  Administered 2018-02-04: 90 mL via INTRA_ARTERIAL

## 2018-02-04 MED ORDER — SODIUM CHLORIDE 0.9 % WEIGHT BASED INFUSION: 1.0000 mL/kg/h | INTRAVENOUS | Status: DC

## 2018-02-04 MED ORDER — ONDANSETRON HCL 4 MG/2ML IJ SOLN: 4.0000 mg | Freq: Four times a day (QID) | INTRAMUSCULAR | Status: DC | PRN

## 2018-02-04 MED ORDER — FENTANYL CITRATE (PF) 100 MCG/2ML IJ SOLN
INTRAMUSCULAR | Status: AC
  Filled 2018-02-04: qty 2

## 2018-02-04 MED ORDER — SODIUM CHLORIDE 0.9% FLUSH: 3.0000 mL | INTRAVENOUS | Status: DC | PRN

## 2018-02-04 MED ORDER — FENTANYL CITRATE (PF) 100 MCG/2ML IJ SOLN
INTRAMUSCULAR | Status: DC | PRN
  Administered 2018-02-04: 25 ug via INTRAVENOUS

## 2018-02-04 MED ORDER — ACETAMINOPHEN 325 MG PO TABS: 650.0000 mg | ORAL_TABLET | ORAL | Status: DC | PRN

## 2018-02-04 MED ORDER — HEPARIN (PORCINE) IN NACL 1000-0.9 UT/500ML-% IV SOLN
INTRAVENOUS | Status: AC
  Filled 2018-02-04: qty 1000

## 2018-02-04 MED ORDER — VERAPAMIL HCL 2.5 MG/ML IV SOLN
INTRAVENOUS | Status: DC | PRN
  Administered 2018-02-04 (×2): 10 mL via INTRA_ARTERIAL

## 2018-02-04 MED ORDER — VERAPAMIL HCL 2.5 MG/ML IV SOLN
INTRAVENOUS | Status: AC
  Filled 2018-02-04: qty 2

## 2018-02-04 MED ORDER — ASPIRIN 81 MG PO CHEW: 81.0000 mg | CHEWABLE_TABLET | ORAL | Status: DC

## 2018-02-04 MED ORDER — HEPARIN SODIUM (PORCINE) 1000 UNIT/ML IJ SOLN
INTRAMUSCULAR | Status: AC
  Filled 2018-02-04: qty 1

## 2018-02-04 MED ORDER — LIDOCAINE HCL (PF) 1 % IJ SOLN
INTRAMUSCULAR | Status: AC
  Filled 2018-02-04: qty 30

## 2018-02-04 MED ORDER — SODIUM CHLORIDE 0.9 % IV SOLN: 250.0000 mL | INTRAVENOUS | Status: DC | PRN

## 2018-02-04 MED ORDER — MIDAZOLAM HCL 2 MG/2ML IJ SOLN
INTRAMUSCULAR | Status: AC
  Filled 2018-02-04: qty 2

## 2018-02-04 MED ORDER — HEPARIN SODIUM (PORCINE) 1000 UNIT/ML IJ SOLN
INTRAMUSCULAR | Status: DC | PRN
  Administered 2018-02-04: 4500 [IU] via INTRAVENOUS

## 2018-02-04 MED ORDER — LIDOCAINE HCL (PF) 1 % IJ SOLN
INTRAMUSCULAR | Status: DC | PRN
  Administered 2018-02-04: 2 mL
  Administered 2018-02-04: 1 mL
  Administered 2018-02-04: 5 mL

## 2018-02-04 MED ORDER — SODIUM CHLORIDE 0.9% FLUSH: 3.0000 mL | Freq: Two times a day (BID) | INTRAVENOUS | Status: DC

## 2018-02-04 MED ORDER — HEPARIN (PORCINE) IN NACL 1000-0.9 UT/500ML-% IV SOLN
INTRAVENOUS | Status: DC | PRN
  Administered 2018-02-04 (×2): 500 mL

## 2018-02-04 MED ORDER — SODIUM CHLORIDE 0.9 % WEIGHT BASED INFUSION
3.0000 mL/kg/h | INTRAVENOUS | Status: AC
  Administered 2018-02-04: 3 mL/kg/h via INTRAVENOUS

## 2018-02-04 MED ORDER — MIDAZOLAM HCL 2 MG/2ML IJ SOLN
INTRAMUSCULAR | Status: DC | PRN
  Administered 2018-02-04: 2 mg via INTRAVENOUS

## 2018-02-04 SURGICAL SUPPLY — 13 items
CATH 5FR JL3.5 JR4 ANG PIG MP (CATHETERS) ×2 IMPLANT
CATH BALLN WEDGE 5F 110CM (CATHETERS) ×2 IMPLANT
CATH INFINITI 5FR JL4 (CATHETERS) ×2 IMPLANT
CATH LAUNCHER 5F EBU3.5 (CATHETERS) ×2 IMPLANT
DEVICE RAD COMP TR BAND LRG (VASCULAR PRODUCTS) ×2 IMPLANT
GLIDESHEATH SLEND SS 6F .021 (SHEATH) ×2 IMPLANT
GUIDEWIRE INQWIRE 1.5J.035X260 (WIRE) IMPLANT
INQWIRE 1.5J .035X260CM (WIRE) ×3
KIT HEART LEFT (KITS) ×3 IMPLANT
PACK CARDIAC CATHETERIZATION (CUSTOM PROCEDURE TRAY) ×3 IMPLANT
SHEATH GLIDE SLENDER 4/5FR (SHEATH) ×2 IMPLANT
TRANSDUCER W/STOPCOCK (MISCELLANEOUS) ×3 IMPLANT
TUBING CIL FLEX 10 FLL-RA (TUBING) ×3 IMPLANT

## 2018-02-04 NOTE — Progress Notes (Signed)
Referral for Dentistry placed.

## 2018-02-04 NOTE — Interval H&P Note (Signed)
History and Physical Interval Note:  02/04/2018 10:07 AM  Jerry Ferguson  has presented today for surgery, with the diagnosis of aort sten  The various methods of treatment have been discussed with the patient and family. After consideration of risks, benefits and other options for treatment, the patient has consented to  Procedure(s): RIGHT/LEFT HEART CATH AND CORONARY ANGIOGRAPHY (N/A) as a surgical intervention .  The patient's history has been reviewed, patient examined, no change in status, stable for surgery.  I have reviewed the patient's chart and labs.  Questions were answered to the patient's satisfaction.     Tonny Bollman

## 2018-02-04 NOTE — Discharge Instructions (Signed)
Radial Site Care ° °This sheet gives you information about how to care for yourself after your procedure. Your health care provider may also give you more specific instructions. If you have problems or questions, contact your health care provider. °What can I expect after the procedure? °After the procedure, it is common to have: °· Bruising and tenderness at the catheter insertion area. °Follow these instructions at home: °Medicines °· Take over-the-counter and prescription medicines only as told by your health care provider. °Insertion site care °· Follow instructions from your health care provider about how to take care of your insertion site. Make sure you: °? Wash your hands with soap and water before you change your bandage (dressing). If soap and water are not available, use hand sanitizer. °? Change your dressing as told by your health care provider. °? Leave stitches (sutures), skin glue, or adhesive strips in place. These skin closures may need to stay in place for 2 weeks or longer. If adhesive strip edges start to loosen and curl up, you may trim the loose edges. Do not remove adhesive strips completely unless your health care provider tells you to do that. °· Check your insertion site every day for signs of infection. Check for: °? Redness, swelling, or pain. °? Fluid or blood. °? Pus or a bad smell. °? Warmth. °· Do not take baths, swim, or use a hot tub until your health care provider approves. °· You may shower 24-48 hours after the procedure, or as directed by your health care provider. °? Remove the dressing and gently wash the site with plain soap and water. °? Pat the area dry with a clean towel. °? Do not rub the site. That could cause bleeding. °· Do not apply powder or lotion to the site. °Activity ° °· For 24 hours after the procedure, or as directed by your health care provider: °? Do not flex or bend the affected arm. °? Do not push or pull heavy objects with the affected arm. °? Do not  drive yourself home from the hospital or clinic. You may drive 24 hours after the procedure unless your health care provider tells you not to. °? Do not operate machinery or power tools. °· Do not lift anything that is heavier than 10 lb (4.5 kg), or the limit that you are told, until your health care provider says that it is safe. °· Ask your health care provider when it is okay to: °? Return to work or school. °? Resume usual physical activities or sports. °? Resume sexual activity. °General instructions °· If the catheter site starts to bleed, raise your arm and put firm pressure on the site. If the bleeding does not stop, get help right away. This is a medical emergency. °· If you went home on the same day as your procedure, a responsible adult should be with you for the first 24 hours after you arrive home. °· Keep all follow-up visits as told by your health care provider. This is important. °Contact a health care provider if: °· You have a fever. °· You have redness, swelling, or yellow drainage around your insertion site. °Get help right away if: °· You have unusual pain at the radial site. °· The catheter insertion area swells very fast. °· The insertion area is bleeding, and the bleeding does not stop when you hold steady pressure on the area. °· Your arm or hand becomes pale, cool, tingly, or numb. °These symptoms may represent a serious problem   that is an emergency. Do not wait to see if the symptoms will go away. Get medical help right away. Call your local emergency services (911 in the [place] Do not drive yourself to the hospital. °Summary °· After the procedure, it is common to have bruising and tenderness at the site. °· Follow instructions from your health care provider about how to take care of your radial site wound. Check the wound every day for signs of infection. °· Do not lift anything that is heavier than 10 lb (4.5 kg), or the limit that you are told, until your health care provider says  that it is safe. °This information is not intended to replace advice given to you by your health care provider. Make sure you discuss any questions you have with your health care provider. °Document Released: 01/25/2010 Document Revised: 01/28/2017 Document Reviewed: 01/28/2017 °Elsevier Interactive Patient Education © 2019 [employer] ° °

## 2018-02-04 NOTE — Telephone Encounter (Signed)
Chart opened by mistake

## 2018-02-05 ENCOUNTER — Ambulatory Visit (HOSPITAL_COMMUNITY)
Admission: RE | Admit: 2018-02-05 | Discharge: 2018-02-05 | Disposition: A | Discharge status: Home / Self Care | Payer: PPO | Source: Ambulatory Visit | Attending: Cardiovascular Disease | Admitting: Cardiovascular Disease

## 2018-02-05 ENCOUNTER — Telehealth: Payer: Self-pay | Admitting: *Deleted

## 2018-02-05 DIAGNOSIS — I350 Nonrheumatic aortic (valve) stenosis: Secondary | ICD-10-CM | POA: Insufficient documentation

## 2018-02-05 DIAGNOSIS — R918 Other nonspecific abnormal finding of lung field: Secondary | ICD-10-CM | POA: Insufficient documentation

## 2018-02-05 LAB — PULMONARY FUNCTION TEST
DL/VA % pred: 95 %
DL/VA: 4.43 ml/min/mmHg/L
DLCO UNC % PRED: 70 %
DLCO cor % pred: 77 %
DLCO cor: 26.08 ml/min/mmHg
DLCO unc: 23.85 ml/min/mmHg
FEF 25-75 PRE: 3.87 L/s
FEF 25-75 Post: 3.65 L/sec
FEF2575-%Change-Post: -5 %
FEF2575-%Pred-Post: 148 %
FEF2575-%Pred-Pre: 156 %
FEV1-%Change-Post: 0 %
FEV1-%Pred-Post: 106 %
FEV1-%Pred-Pre: 107 %
FEV1-Post: 3.52 L
FEV1-Pre: 3.55 L
FEV1FVC-%Change-Post: 2 %
FEV1FVC-%Pred-Pre: 110 %
FEV6-%CHANGE-POST: -1 %
FEV6-%Pred-Post: 99 %
FEV6-%Pred-Pre: 100 %
FEV6-Post: 4.23 L
FEV6-Pre: 4.28 L
FEV6FVC-%Change-Post: 2 %
FEV6FVC-%Pred-Post: 105 %
FEV6FVC-%Pred-Pre: 103 %
FVC-%Change-Post: -3 %
FVC-%Pred-Post: 94 %
FVC-%Pred-Pre: 97 %
FVC-Post: 4.25 L
FVC-Pre: 4.41 L
Post FEV1/FVC ratio: 83 %
Post FEV6/FVC ratio: 100 %
Pre FEV1/FVC ratio: 81 %
Pre FEV6/FVC Ratio: 97 %
RV % pred: 97 %
RV: 2.48 L
TLC % pred: 95 %
TLC: 6.91 L

## 2018-02-05 MED ORDER — ALBUTEROL SULFATE (2.5 MG/3ML) 0.083% IN NEBU
2.5000 mg | INHALATION_SOLUTION | Freq: Once | RESPIRATORY_TRACT | Status: AC
  Administered 2018-02-05: 2.5 mg via RESPIRATORY_TRACT

## 2018-02-05 MED ORDER — IOPAMIDOL (ISOVUE-370) INJECTION 76%
100.0000 mL | Freq: Once | INTRAVENOUS | Status: AC | PRN
  Administered 2018-02-05: 100 mL via INTRAVENOUS

## 2018-02-05 NOTE — Telephone Encounter (Signed)
This patient needs to be scheduled for a right carotid surgery (TCAR vs CEA) depending on Dr. Harvie BridgeNahser's recommendation on the type of anesthesia the patient may have. Please let me know asap on which surgery to schedule. Patient had Cath yesterday by Dr. Excell Seltzerooper.

## 2018-02-09 ENCOUNTER — Ambulatory Visit (HOSPITAL_COMMUNITY): Payer: Self-pay | Admitting: Dentistry

## 2018-02-09 ENCOUNTER — Encounter (HOSPITAL_COMMUNITY): Payer: Self-pay | Admitting: Dentistry

## 2018-02-09 VITALS — BP 129/72 | HR 57 | Temp 97.9°F

## 2018-02-09 DIAGNOSIS — M264 Malocclusion, unspecified: Secondary | ICD-10-CM

## 2018-02-09 DIAGNOSIS — K0889 Other specified disorders of teeth and supporting structures: Secondary | ICD-10-CM

## 2018-02-09 DIAGNOSIS — K030 Excessive attrition of teeth: Secondary | ICD-10-CM

## 2018-02-09 DIAGNOSIS — I350 Nonrheumatic aortic (valve) stenosis: Secondary | ICD-10-CM

## 2018-02-09 DIAGNOSIS — Z01818 Encounter for other preprocedural examination: Secondary | ICD-10-CM

## 2018-02-09 DIAGNOSIS — K0530 Chronic periodontitis, unspecified: Secondary | ICD-10-CM

## 2018-02-09 DIAGNOSIS — M2632 Excessive spacing of fully erupted teeth: Secondary | ICD-10-CM

## 2018-02-09 DIAGNOSIS — K011 Impacted teeth: Secondary | ICD-10-CM

## 2018-02-09 DIAGNOSIS — K083 Retained dental root: Secondary | ICD-10-CM

## 2018-02-09 DIAGNOSIS — K08409 Partial loss of teeth, unspecified cause, unspecified class: Secondary | ICD-10-CM

## 2018-02-09 DIAGNOSIS — K045 Chronic apical periodontitis: Secondary | ICD-10-CM

## 2018-02-09 DIAGNOSIS — K029 Dental caries, unspecified: Secondary | ICD-10-CM

## 2018-02-09 DIAGNOSIS — K036 Deposits [accretions] on teeth: Secondary | ICD-10-CM

## 2018-02-09 DIAGNOSIS — K06010 Localized gingival recession, unspecified: Secondary | ICD-10-CM

## 2018-02-09 MED ORDER — AMOXICILLIN 500 MG PO CAPS: ORAL_CAPSULE | ORAL | 1 refills | Status: DC

## 2018-02-09 NOTE — Progress Notes (Signed)
DENTAL CONSULTATION  Date of Consultation:  02/09/2018 Patient Name:   Jerry Ferguson Date of Birth:   01/01/1946 Medical Record Number: [id number]  VITALS: BP 129/72 (BP Location: Right Arm)   Pulse (!) 57   Temp 97.9 F (36.6 C)   CHIEF COMPLAINT: Patient referred by Dr. Excell Seltzerooper for dental consultation.  HPI: Jerry Ferguson is a 73 year old male recently diagnosed with severe aortic stenosis and coronary artery disease. Patient with anticipated heart valve surgery with either stenting or coronary artery bypass graft procedure based on whether the patient will proceed with open-heart valve surgery or TAVR procedure. Patient is now seen as part of a medically necessary pre-heart valve surgery dental protocol examination to rule out dental infection that may affect the patient's systemic health and the anticipated heart valve surgery.  The patient currently denies acute toothaches, swellings, or abscesses. Patient was last seen greater than 10 years ago. [facility] Dr. Freida Busmanalton has since retired. Patient had extraction at that time with no complications. Patient denies having any partial dentures. Patient denies having dental phobia.   Patient Active Problem List   Diagnosis Date Noted  . Severe aortic stenosis 11/28/2012    Priority: High  . Visit for preventive health examination 11/04/2013  . Prostate cancer screening 11/04/2013  . Screening for ischemic heart disease 11/04/2013  . Acute bacterial sinusitis 11/04/2013  . Urinary frequency 11/04/2013  . Occlusion and stenosis of carotid artery without mention of cerebral infarction 07/15/2013  . Encounter to establish care 05/09/2013  . Carotid stenosis 11/28/2012  . Essential hypertension 11/28/2012  . Carotid bruit 11/01/2012  . Family history of colon cancer 11/01/2012  . Weakness generalized 05/14/2010  . CHRONIC RHINITIS 01/31/2009  . OVERWEIGHT 02/27/2008  . UTI 02/27/2008  .  BACK PAIN, LUMBAR 02/23/2008  . CARDIAC MURMUR 02/23/2008  . CHEST PAIN 02/23/2008  . HYPERCHOLESTEROLEMIA 11/26/2007  . GERD 11/26/2007  . DEGENERATIVE JOINT DISEASE 11/26/2007  . LIVER FUNCTION TESTS, ABNORMAL, HX OF 11/26/2007    PMH: Past Medical History:  Diagnosis Date  . Abnormal liver function test   . Acute bronchitis   . Allergy   . Aortic valve disorders   . Broken ankle    Right  . Cardiac murmur   . Carotid artery occlusion    Bilateral Bruit  . Cataract   . Chicken pox   . Chronic rhinitis   . DJD (degenerative joint disease)   . GERD (gastroesophageal reflux disease)   . History of UTI   . Hypercholesteremia   . Hypertension   . Lumbar back pain   . Overweight(278.02)     PSH: Past Surgical History:  Procedure Laterality Date  . APPENDECTOMY    . COLONOSCOPY    . CORONARY ANGIOGRAPHY N/A 02/04/2018   Procedure: CORONARY ANGIOGRAPHY;  Surgeon: Tonny Bollmanooper, Michael, MD;  [facility];  Service: Cardiovascular;  Laterality: N/A;  . RIGHT HEART CATH N/A 02/04/2018   Procedure: RIGHT HEART CATH;  Surgeon: Tonny Bollmanooper, Michael, MD;  [facility];  Service: Cardiovascular;  Laterality: N/A;    ALLERGIES: No Known Allergies  MEDICATIONS: Current Outpatient Medications  Medication Sig Dispense Refill  . aspirin EC 81 MG tablet Take 81 mg by mouth daily.    Marland Kitchen. atorvastatin (LIPITOR) 20 MG tablet Take 1 tablet (20 mg total) by mouth daily. 90 tablet 0  . lisinopril (PRINIVIL,ZESTRIL) 5 MG tablet TAKE 1 TABLET BY MOUTH EVERY DAY  90 tablet 1  . acetaminophen (TYLENOL) 650 MG CR tablet Take 650-1,300 mg by mouth every 8 (eight) hours as needed for pain (legs cramps.).    Marland Kitchen. cyclobenzaprine (FLEXERIL) 10 MG tablet Take 10 mg by mouth 3 (three) times daily as needed for muscle spasms.    . fluticasone (FLONASE) 50 MCG/ACT nasal spray Place 2 sprays into both nostrils as needed. (Patient not taking: Reported on 02/09/2018) 16 g 11   No current  facility-administered medications for this visit.     LABS: Lab Results  Component Value Date   WBC 8.2 02/03/2018   HGB 11.9 (L) 02/04/2018   HCT 35.0 (L) 02/04/2018   MCV 84 02/03/2018   PLT 210 02/03/2018      Component Value Date/Time   NA 142 02/04/2018 1050   NA 141 02/03/2018 1249   K 3.9 02/04/2018 1050   CL 104 02/03/2018 1249   CO2 26 02/03/2018 1249   GLUCOSE 98 02/03/2018 1249   GLUCOSE 110 (H) 10/13/2017 0752   BUN 12 02/03/2018 1249   CREATININE 1.03 02/03/2018 1249   CREATININE 0.97 11/04/2013 1038   CALCIUM 10.2 02/03/2018 1249   GFRNONAA 72 02/03/2018 1249   GFRNONAA 80 11/04/2013 1038   GFRAA 84 02/03/2018 1249   GFRAA >89 11/04/2013 1038   No results found for: INR, PROTIME No results found for: PTT  SOCIAL HISTORY: Social History   Socioeconomic History  . Marital status: Widowed    Spouse name: stacey(deceased 2011)  . Number of children: 1  . Years of education: Not on file  . Highest education level: Not on file  Occupational History  . Occupation: welder  Social Needs  . Financial resource strain: Not on file  . Food insecurity:    Worry: Not on file    Inability: Not on file  . Transportation needs:    Medical: Not on file    Non-medical: Not on file  Tobacco Use  . Smoking status: Former Smoker    Packs/day: 1.00    Years: 15.00    Pack years: 15.00    Types: Cigarettes    Last attempt to quit: 01/06/1989    Years since quitting: 29.1  . Smokeless tobacco: Former NeurosurgeonUser    Types: Snuff  . Tobacco comment: chewed some in early 20's  Substance and Sexual Activity  . Alcohol use: No  . Drug use: No  . Sexual activity: Not on file  Lifestyle  . Physical activity:    Days per week: Not on file    Minutes per session: Not on file  . Stress: Not on file  Relationships  . Social connections:    Talks on phone: Not on file    Gets together: Not on file    Attends religious service: Not on file    Active member of club or  organization: Not on file    Attends meetings of clubs or organizations: Not on file    Relationship status: Not on file  . Intimate partner violence:    Fear of current or ex partner: Not on file    Emotionally abused: Not on file    Physically abused: Not on file    Forced sexual activity: Not on file  Other Topics Concern  . Not on file  Social History Narrative   Uncle with prostate cancer?    FAMILY HISTORY: Family History  Problem Relation Age of Onset  . Colon cancer Father  metastatic  . Other Father        DJD  . Arthritis Father 39  . Healthy Mother        Living-87  . Prostate cancer Brother   . Heart attack Paternal Grandfather   . Heart disease Paternal Grandfather   . Cancer Paternal Uncle   . Colon cancer Sister   . Hypertension Daughter        x1  . Healthy Daughter        x1  . Esophageal cancer Neg Hx   . Stomach cancer Neg Hx   . Rectal cancer Neg Hx     REVIEW OF SYSTEMS: Reviewed with the patient as per History of present illness. Psych: Patient denies having dental phobia.  DENTAL HISTORY: CHIEF COMPLAINT: Patient referred by Dr. Excell Seltzer for dental consultation.  HPI: Jerry Ferguson is a 73 year old male recently diagnosed with severe aortic stenosis and coronary artery disease. Patient with anticipated heart valve surgery with either stenting or coronary artery bypass graft procedure based on whether the patient will proceed with open-heart valve surgery or TAVR procedure. Patient is now seen as part of a medically necessary pre-heart valve surgery dental protocol examination to rule out dental infection that may affect the patient's systemic health and the anticipated heart valve surgery.  The patient currently denies acute toothaches, swellings, or abscesses. Patient was last seen greater than 10 years ago. Patient saw a Dr. Freida Busman in [facility]. Dr. Freida Busman has since retired. Patient had extraction at that time with no  complications. Patient denies having any partial dentures. Patient denies having dental phobia.   DENTAL EXAMINATION: GENERAL:  The patient is a well-developed, well-nourished male in no acute distress. HEAD AND NECK:  There is no palpable neck lymphadenopathy. The patient denies acute TMJ symptoms. INTRAORAL EXAM:  The patient has normal saliva. There is no evidence of oral abscess formation. DENTITION: The patient is missing tooth numbers 1, 3, 4, 5, 13, 14, 16, 18, 19, 30, 31, and 32.  Tooth numbers 2, 6, 7, 11, 20, 21 are present as retained root segments. Tooth #17 is a full bony impaction with an oral communication and coronal radiolucency.  There is mandibular anterior incisal attrition. 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 PERIODONTAL:  The patient has chronic periodontitis with plaque and calculus accumulations, generalized gingival recession, and tooth mobility.  Patient has moderate to severe bone loss noted. Radiographic calculus is noted. DENTAL CARIES/SUBOPTIMAL RESTORATIONS:  Multiple dental caries are noted as per dental charting form. ENDODONTIC:  The patient currently denies acute pulpitis symptoms. Patient has multiple areas of periapical pathology and radiolucency. Retained root in the area #7 has had a previous root canal therapy. CROWN AND BRIDGE: There are no crown or bridge restorations noted. PROSTHODONTIC: The patient denies having partial dentures. OCCLUSION:  The patient is a poor occlusal scheme secondary to multiple missing teeth, multiple retained root segments, supra-eruption and drifting of the unopposed teeth into the edentulous areas, and lack of  Replacement of missing teeth with dental prostheses  RADIOGRAPHIC INTERPRETATION: Orthopantogram was taken and supplemented with 14 periapical radiographs. There are multiple missing teeth. There multiple retained root segments. There is an impacted tooth #17 with a coronal radiolucency. There is moderate to severe bone loss  noted. Radiographic calculus is noted. There is supra-eruption and drifting of the unopposed teeth into the edentulous areas. Multiple periapical radiolucencies are noted. There is evidence of mandibular anterior incisal attrition. 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  ASSESSMENTS: 1. Severe  aortic stenosis 2. Pre-heart valve surgery dental protocol 3. Chronic apical periodontitis 4. Multiple retained root segments 5. Impacted tooth #17 with coronal radiolucency 6. Chronic periodontitis with bone loss 7. Generalized gingival recession 8. Accretions 9. Tooth mobility 10. Multiple missing teeth 11. Multiple diastemas 12 Supra-eruption and drifting of the unopposed teeth into the edentulous areas 13. No history of partial dentures 14. Mandibular anterior incisal attrition 15. Poor occlusal scheme and malocclusion 16.  Questionable need for antibiotic premedication prior to invasive dental procedures due to anticipated heart valve surgery.  PLAN/RECOMMENDATIONS: 1. I discussed the risks, benefits, and complications of various treatment options with the patient in relationship to his medical and dental conditions, anticipated heart valve surgery with other coronary artery disease intervention, and risk for endocarditis.  We discussed various treatment options to include no treatment, multiple extractions with alveoloplasty, pre-prosthetic surgery as indicated, periodontal therapy, dental restorations, root canal therapy, crown and bridge therapy, implant therapy, and replacement of missing teeth as indicated. We also discussed referral to an oral surgeon due to the complexity associated with removal of impacted tooth #17. The patient currently wishes to proceed with oral surgery referral for consultation on Thursday, 02/11/2018 at 4 PM. Patient will be evaluated for extraction of tooth numbers 2, 6, 7, 9, 10, 11, 12, 15, impacted17, 20, 21 with alveoloplasty.  The patient will then be scheduled for initial  periodontal therapy based upon anticipated oral surgery date as time and space permits.  The patient will then need to follow-up with the general dentist of his choice for additional periodontal therapy, dental restorations, and evaluation for replacement of missing teeth after adequate healing and once medically stable from the anticipated heart valve surgery. The patient will require antibiotic premedication prior to invasive dental procedures after the heart valve surgery per American Heart Association guidelines.    A prescription for amoxicillin premedication was sent to [facility] pharmacy for the anticipated periodontal procedure.  2. Discussion of findings with medical team and coordination of future medical and dental care as needed.  I spent in excess of 120 minutes during the conduct of this consultation and >50% of this time involved direct face-to-face encounter for counseling and/or coordination of the patient's care.    Charlynne Pander, DDS

## 2018-02-09 NOTE — Patient Instructions (Signed)
[place]    Department of Dental Medicine     DR. KULINSKI      HEART VALVES AND MOUTH CARE:  FACTS:   If you have any infection in your mouth, it can infect your heart valve.  If you heart valve is infected, you will be seriously ill.  Infections in the mouth can be SILENT and do not always cause pain.  Examples of infections in the mouth are gum disease, dental cavities, and abscesses.  Some possible signs of infection are: Bad breath, bleeding gums, or teeth that are sensitive to sweets, hot, and/or cold. There are many other signs as well.  WHAT YOU HAVE TO DO:   Brush your teeth after meals and at bedtime. Spend at least 2 minutes brushing well, especially behind your back teeth and all around your teeth that stand alone. Brush at the gumline also.  Do not go to bed without brushing your teeth and flossing.  If you gums bleed when you brush or floss, do NOT stop brushing or flossing. It usually means that your gums need more attention and better cleaning.   If your Dentist or Dr. Kulinski gave you a prescription mouthwash to use, make sure to use it as directed. If you run out of the medication, get a refill at the pharmacy.   If you were given any other medications or directions by your Dentist, please follow them. If you did not understand the directions or forget what you were told, please call. We will be happy to refresh her memory.  If you need antibiotics before dental procedures, make sure you take them one hour prior to every dental visit as directed.   Get a dental checkup every 4-6 months in order to keep your mouth healthy, or to find and treat any new infection. You will most likely need your teeth cleaned or gums treated at the same time.  If you are not able to come in for your scheduled appointment, call your Dentist as soon as possible to reschedule.  If you have a problem in between dental visits, call your Dentist.  

## 2018-02-10 ENCOUNTER — Other Ambulatory Visit: Payer: Self-pay

## 2018-02-10 ENCOUNTER — Encounter: Payer: Self-pay | Admitting: Thoracic Surgery (Cardiothoracic Vascular Surgery)

## 2018-02-10 ENCOUNTER — Institutional Professional Consult (permissible substitution): Payer: PPO | Admitting: Thoracic Surgery (Cardiothoracic Vascular Surgery)

## 2018-02-10 VITALS — BP 118/72 | HR 66 | Resp 18 | Ht 71.0 in | Wt 203.4 lb

## 2018-02-10 DIAGNOSIS — I350 Nonrheumatic aortic (valve) stenosis: Secondary | ICD-10-CM | POA: Diagnosis not present

## 2018-02-10 DIAGNOSIS — I2510 Atherosclerotic heart disease of native coronary artery without angina pectoris: Secondary | ICD-10-CM | POA: Insufficient documentation

## 2018-02-10 NOTE — Patient Instructions (Signed)
Continue all previous medications without any changes at this time  

## 2018-02-10 NOTE — H&P (View-Only) (Signed)
[facility]  MULTIDISCIPLINARY HEART VALVE CLINIC  CARDIOTHORACIC SURGERY CONSULTATION REPORT  Referring Provider is Nahser, Philip J, MD PCP is [name], Edward, PA-C  Chief Complaint  Patient presents with  . Aortic Stenosis    new patient consultation, TAVR vs CABG/SAVR, review all studies    HPI:  Patient is a 72-year-old male with history of aortic stenosis, coronary artery disease, cerebrovascular disease with high-grade asymptomatic right internal carotid artery stenosis, hypertension, and hypercholesterolemia who has been referred for surgical consultation to discuss treatment options for management of severe symptomatic aortic stenosis and coronary artery disease.  Patient's cardiac history dates back more than 5 years ago when he had 2 brief syncopal episodes.  He underwent carotid duplex scan which revealed moderate bilateral 60-79% internal carotid artery stenosis.  He was evaluated by Dr. Nelson and noted to have a heart murmur on exam.  Echocardiogram revealed normal left ventricular systolic function with mild to moderate aortic stenosis with mean transvalvular gradient estimated 19 mmHg.  He was referred for vascular surgical consultation and initially evaluated by Dr. Chen.  Medical therapy with follow-up was recommended.  He was lost to follow-up for several years but was referred back by his primary care physician for cardiology consultation and evaluated by Dr. Nahser on November 11, 2017.  At that time the patient reported mild symptoms of exertional shortness of breath and he denied any further episodes of dizziness or near syncope.  Follow-up transthoracic echocardiogram performed November 25, 2017 revealed severe aortic stenosis with normal left ventricular systolic function.  Peak velocity across aortic valve measured 4.8 m/s corresponding to mean transvalvular gradient estimated 50 mmHg.  The DVI was 0.21 with aortic valve area calculated 0.81 cm.  Initially  continued observation was recommended.  However, shortly after that the patient was seen by Dr. Clark for follow-up of his bilateral carotid artery stenosis.  Carotid duplex scan performed January 12, 2018 revealed high-grade 80-99% right internal carotid artery stenosis.  There was 40 to 59% left internal carotid artery stenosis.  Right carotid endarterectomy was recommended and the patient was referred back to Dr. Nahser for preoperative cardiac clearance.  The patient was subsequently referred to the multidisciplinary heart valve clinic and underwent left and right heart catheterization by Dr. Cooper on February 04, 2018.  Catheterization revealed multivessel coronary artery disease with severe diffuse high-grade multisegment stenosis of the right coronary artery and left to right collateral filling of the posterior descending coronary artery and right posterolateral branch.  There was moderate ostial stenosis of the left circumflex coronary artery and nonobstructive disease in the left anterior descending coronary artery.  Right heart pressures were normal.  CT angiography was performed and the patient was referred for surgical consultation.  He was also referred for dental service consultation and has been evaluated earlier this week by Dr. Kulinsky.  Dental extraction has been recommended and the patient has been referred to Dr. Owsley.  Patient is widowed and lives alone in [place].  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  He remains entirely functionally independent and reasonably active for his age.  He does complain that over the past 6 months he has developed progressive exertional shortness of breath and fatigue.  He gets tired much more easily than he used to in the past.  He states that recently when he was at work he was hurrying to take care of something and he got severe shortness of breath and vague pressure across his chest.    His symptoms resolved quickly when he stopped to rest.  He denies  any resting shortness of breath, PND, orthopnea, or lower extremity edema.  He has not had any recent dizzy spells or near syncope.  Past Medical History:  Diagnosis Date  . Abnormal liver function test   . Acute bronchitis   . Allergy   . Aortic valve disorders   . Broken ankle    Right  . Cardiac murmur   . Carotid artery occlusion    Bilateral Bruit  . Cataract   . Chicken pox   . Chronic rhinitis   . Coronary artery disease involving native coronary artery of native heart without angina pectoris   . DJD (degenerative joint disease)   . GERD (gastroesophageal reflux disease)   . History of UTI   . Hypercholesteremia   . Hypertension   . Lumbar back pain   . Overweight(278.02)     Past Surgical History:  Procedure Laterality Date  . APPENDECTOMY    . COLONOSCOPY    . CORONARY ANGIOGRAPHY N/A 02/04/2018   Procedure: CORONARY ANGIOGRAPHY;  Surgeon: Cooper, Michael, MD;  Location: [facility];  Service: Cardiovascular;  Laterality: N/A;  . RIGHT HEART CATH N/A 02/04/2018   Procedure: RIGHT HEART CATH;  Surgeon: Cooper, Michael, MD;  Location: [facility];  Service: Cardiovascular;  Laterality: N/A;    Family History  Problem Relation Age of Onset  . Colon cancer Father        metastatic  . Other Father        DJD  . Arthritis Father 75  . Healthy Mother        Living-87  . Prostate cancer Brother   . Heart attack Paternal Grandfather   . Heart disease Paternal Grandfather   . Cancer Paternal Uncle   . Colon cancer Sister   . Hypertension Daughter        x1  . Healthy Daughter        x1  . Esophageal cancer Neg Hx   . Stomach cancer Neg Hx   . Rectal cancer Neg Hx     Social History   Socioeconomic History  . Marital status: Widowed    Spouse name: stacey(deceased 2011)  . Number of children: 1  . Years of education: Not on file  . Highest education level: Not on file  Occupational History  . Occupation: welder  Social Needs  . Financial  resource strain: Not on file  . Food insecurity:    Worry: Not on file    Inability: Not on file  . Transportation needs:    Medical: Not on file    Non-medical: Not on file  Tobacco Use  . Smoking status: Former Smoker    Packs/day: 1.00    Years: 15.00    Pack years: 15.00    Types: Cigarettes    Last attempt to quit: 01/06/1989    Years since quitting: 29.1  . Smokeless tobacco: Former User    Types: Snuff  . Tobacco comment: chewed some in early 20's  Substance and Sexual Activity  . Alcohol use: No  . Drug use: No  . Sexual activity: Not on file  Lifestyle  . Physical activity:    Days per week: Not on file    Minutes per session: Not on file  . Stress: Not on file  Relationships  . Social connections:    Talks on phone: Not on file    Gets together: Not   on file    Attends religious service: Not on file    Active member of club or organization: Not on file    Attends meetings of clubs or organizations: Not on file    Relationship status: Not on file  . Intimate partner violence:    Fear of current or ex partner: Not on file    Emotionally abused: Not on file    Physically abused: Not on file    Forced sexual activity: Not on file  Other Topics Concern  . Not on file  Social History Narrative   Uncle with prostate cancer?    Current Outpatient Medications  Medication Sig Dispense Refill  . acetaminophen (TYLENOL) 650 MG CR tablet Take 650-1,300 mg by mouth every 8 (eight) hours as needed for pain (legs cramps.).    . amoxicillin (AMOXIL) 500 MG capsule Take four capsules one hour before dental appointment. 4 capsule 1  . aspirin EC 81 MG tablet Take 81 mg by mouth daily.    . atorvastatin (LIPITOR) 20 MG tablet Take 1 tablet (20 mg total) by mouth daily. 90 tablet 0  . cyclobenzaprine (FLEXERIL) 10 MG tablet Take 10 mg by mouth 3 (three) times daily as needed for muscle spasms.    . fluticasone (FLONASE) 50 MCG/ACT nasal spray Place 2 sprays into both nostrils  as needed. 16 g 11  . lisinopril (PRINIVIL,ZESTRIL) 5 MG tablet TAKE 1 TABLET BY MOUTH EVERY DAY 90 tablet 1   No current facility-administered medications for this visit.     No Known Allergies    Review of Systems:   General:  normal appetite, decreased energy, no weight gain, or weight loss, no fever  Cardiac:  1 episode chest pain with exertion, no chest pain at rest, +SOB with exertion, no resting SOB, no PND, no orthopnea, no palpitations, no arrhythmia, no atrial fibrillation, no LE edema, no dizzy spells, no syncope  Respiratory:  + exertional shortness of breath, no home oxygen, no productive cough, no dry cough, no bronchitis, no wheezing, no hemoptysis, no asthma, no pain with inspiration or cough, no sleep apnea, no CPAP at night  GI:   no difficulty swallowing, no reflux, no frequent heartburn, no hiatal hernia, no abdominal pain, no constipation, no diarrhea, no hematochezia, no hematemesis, no melena  GU:   no dysuria,  occasional frequency, no urinary tract infection, no hematuria, no enlarged prostate, no kidney stones, no kidney disease  Vascular:  no pain suggestive of claudication, no pain in feet, no leg cramps, no varicose veins, no DVT, no non-healing foot ulcer  Neuro:   no stroke, no TIA's, no seizures, no headaches, no temporary blindness one eye,  no slurred speech, no peripheral neuropathy, no chronic pain, no instability of gait, no memory/cognitive dysfunction  Musculoskeletal: + arthritis, no joint swelling, no myalgias, no difficulty walking, normal mobility   Skin:   no rash, no itching, no skin infections, no pressure sores or ulcerations  Psych:   no anxiety, no depression, no nervousness, no unusual recent stress  Eyes:   no blurry vision, no floaters, no recent vision changes, + wears glasses or contacts  ENT:   no hearing loss, + loose or painful teeth, no dentures, last saw dentist yesterday  Hematologic:  no easy bruising, no abnormal bleeding, no  clotting disorder, no frequent epistaxis  Endocrine:  no diabetes, does not check CBG's at home           Physical Exam:   BP   118/72 (BP Location: Right Arm, Patient Position: Sitting, Cuff Size: Large)   Pulse 66   Resp 18   Ht 5' 11" (1.803 m)   Wt 203 lb 6.4 oz (92.3 kg)   SpO2 97% Comment: RA  BMI 28.37 kg/m   General:    well-appearing  HEENT:  Unremarkable   Neck:   no JVD, bilateral carotid bruits R>L, no adenopathy   Chest:   clear to auscultation, symmetrical breath sounds, no wheezes, no rhonchi   CV:   RRR, grade IV/VI crescendo/decrescendo murmur heard best at RSB,  no diastolic murmur  Abdomen:  soft, non-tender, no masses   Extremities:  warm, well-perfused, pulses palpable, no LE edema  Rectal/GU  Deferred  Neuro:   Grossly non-focal and symmetrical throughout  Skin:   Clean and dry, no rashes, no breakdown   Diagnostic Tests:  Echocardiography  Patient:    Killman, Rusty T MR #:       [id number] Study Date: 11/25/2017 Gender:     M Age:        72 Height:     180.3 cm Weight:     90.7 kg BSA:        2.15 m^2 Pt. Status: Room:   REFERRING    Philip Nahser, M.D.  ATTENDING    Mark Skains, M.D.  SONOGRAPHER  Billy Chung, RDCS  PERFORMING   [facility]  ORDERING     Nahser, Jr  REFERRING    Nahser, Jr  cc:  ------------------------------------------------------------------- LV EF: 65% -   70%  ------------------------------------------------------------------- Indications:      Aortic stenosis (I35).  ------------------------------------------------------------------- History:   PMH:   Murmur.  Aortic valve disease.  Risk factors: Carotid stenosis. Hypertension. Dyslipidemia.  ------------------------------------------------------------------- Study Conclusions  - Left ventricle: The cavity size was normal. Systolic function was   vigorous. The estimated ejection fraction was in the range of 65%   to 70%. Wall motion was  normal; there were no regional wall   motion abnormalities. Doppler parameters are consistent with   abnormal left ventricular relaxation (grade 1 diastolic   dysfunction). GLS: -12.3% - Aortic valve: Valve mobility was restricted. There was severe   stenosis. There was trivial regurgitation. Mean gradient (S): 50   mm Hg. Peak gradient (S): 93 mm Hg. - Aorta: Ascending aortic diameter: 39 mm (S). - Ascending aorta: The ascending aorta was mildly dilated.  Impressions:  - Aortic stenosis is now severe.  ------------------------------------------------------------------- Labs, prior tests, procedures, and surgery: Transthoracic echocardiography (06/07/2013).    The aortic valve showed moderate stenosis and no significant regurgitation.  EF was 60% and PA pressure was 26 (systolic). Aortic valve: peak gradient of 41 mm Hg and mean gradient of 23 mm Hg.  ------------------------------------------------------------------- Study data:  Strain imaging. Comparison was made to the study of 06/07/2013.  Study status:  Routine.  Procedure:  The patient reported no pain pre or post test. Transthoracic echocardiography. Image quality was adequate.  Study completion:  There were no complications.          Echocardiography.  M-mode, complete 2D, spectral Doppler, and color Doppler.  Birthdate:  Patient birthdate: 10/06/1945.  Age:  Patient is 72 yr old.  Sex:  Gender: male.    BMI: 27.9 kg/m^2.  Blood pressure:     130/74  Patient status:  Outpatient.  Study date:  Study date: 11/25/2017. Study time: 01:28 PM.  Location:  [facility]  -------------------------------------------------------------------  ------------------------------------------------------------------- Left ventricle:  The cavity   size was normal. Systolic function was vigorous. The estimated ejection fraction was in the range of 65% to 70%. Wall motion was normal; there were no regional wall motion abnormalities.  GLS: -12.3% Doppler parameters are consistent with abnormal left ventricular relaxation (grade 1 diastolic dysfunction).  ------------------------------------------------------------------- Aortic valve:  Poorly visualized.  Trileaflet; severely thickened, severely calcified leaflets. Valve mobility was restricted. Doppler:   There was severe stenosis.   There was trivial regurgitation.    VTI ratio of LVOT to aortic valve: 0.25. Valve area (VTI): 0.81 cm^2. Indexed valve area (VTI): 0.38 cm^2/m^2. Peak velocity ratio of LVOT to aortic valve: 0.21. Valve area (Vmax): 0.64 cm^2. Indexed valve area (Vmax): 0.3 cm^2/m^2. Mean velocity ratio of LVOT to aortic valve: 0.22. Valve area (Vmean): 0.7 cm^2. Indexed valve area (Vmean): 0.32 cm^2/m^2.    Mean gradient (S): 50 mm Hg. Peak gradient (S): 93 mm Hg.  ------------------------------------------------------------------- Aorta:  Ascending aorta: The ascending aorta was mildly dilated.  ------------------------------------------------------------------- Mitral valve:   Structurally normal valve.   Mobility was not restricted.  Doppler:  Transvalvular velocity was within the normal range. There was no evidence for stenosis. There was no regurgitation.    Valve area by pressure half-time: 2.86 cm^2. Indexed valve area by pressure half-time: 1.33 cm^2/m^2.    Peak gradient (D): 3 mm Hg.  ------------------------------------------------------------------- Left atrium:  The atrium was normal in size.  ------------------------------------------------------------------- Right ventricle:  The cavity size was normal. Wall thickness was normal. Systolic function was normal.  ------------------------------------------------------------------- Pulmonic valve:    Doppler:  Transvalvular velocity was within the normal range. There was no evidence for stenosis.  ------------------------------------------------------------------- Tricuspid  valve:   Structurally normal valve.    Doppler: Transvalvular velocity was within the normal range. There was no regurgitation.  ------------------------------------------------------------------- Pulmonary artery:   The main pulmonary artery was normal-sized. Systolic pressure was within the normal range.  ------------------------------------------------------------------- Right atrium:  The atrium was normal in size.  ------------------------------------------------------------------- Pericardium:  There was no pericardial effusion.  ------------------------------------------------------------------- Systemic veins: Inferior vena cava: The vessel was normal in size.  ------------------------------------------------------------------- Measurements   Left ventricle                            Value          Reference  LV ID, ED, PLAX chordal           (L)     40    mm       43 - 52  LV ID, ES, PLAX chordal                   26    mm       23 - 38  LV fx shortening, PLAX chordal            35    %        >=29  LV PW thickness, ED                       14    mm       ---------  IVS/LV PW ratio, ED                       1.07           <=1.3  Stroke volume, 2D                           90    ml       ---------  Stroke volume/bsa, 2D                     42    ml/m^2   ---------  LV e&', lateral                            6.08  cm/s     ---------  LV E/e&', lateral                          13.88          ---------  LV e&', medial                             4.43  cm/s     ---------  LV E/e&', medial                           19.05          ---------  LV e&', average                            5.26  cm/s     ---------  LV E/e&', average                          16.06          ---------    Ventricular septum                        Value          Reference  IVS thickness, ED                         15    mm       ---------    LVOT                                      Value           Reference  LVOT ID, S                                20    mm       ---------  LVOT area                                 3.14  cm^2     ---------  LVOT peak velocity, S                     101   cm/s     ---------  LVOT mean velocity, S                     73.8  cm/s     ---------  LVOT VTI, S                                 28.8  cm       ---------    Aortic valve                              Value          Reference  Aortic valve peak velocity, S             481   cm/s     ---------  Aortic valve mean velocity, S             333   cm/s     ---------  Aortic valve VTI, S                       115   cm       ---------  Aortic mean gradient, S                   50    mm Hg    ---------  Aortic peak gradient, S                   93    mm Hg    ---------  VTI ratio, LVOT/AV                        0.25           ---------  Aortic valve area, VTI                    0.81  cm^2     ---------  Aortic valve area/bsa, VTI                0.38  cm^2/m^2 ---------  Velocity ratio, peak, LVOT/AV             0.21           ---------  Aortic valve area, peak velocity          0.64  cm^2     ---------  Aortic valve area/bsa, peak               0.3   cm^2/m^2 ---------  velocity  Velocity ratio, mean, LVOT/AV             0.22           ---------  Aortic valve area, mean velocity          0.7   cm^2     ---------  Aortic valve area/bsa, mean               0.32  cm^2/m^2 ---------  velocity    Aorta                                     Value          Reference  Aortic root ID, ED                        34    mm       ---------  Ascending aorta ID, A-P, S                39    mm       ---------    Left atrium                                 Value          Reference  LA ID, A-P, ES                            37    mm       ---------  LA ID/bsa, A-P                            1.72  cm/m^2   <=2.2  LA volume, S                              65.7  ml       ---------  LA volume/bsa, S                          30.6   ml/m^2   ---------  LA volume, ES, 1-p A4C                    73    ml       ---------  LA volume/bsa, ES, 1-p A4C                34    ml/m^2   ---------  LA volume, ES, 1-p A2C                    54.6  ml       ---------  LA volume/bsa, ES, 1-p A2C                25.4  ml/m^2   ---------    Mitral valve                              Value          Reference  Mitral E-wave peak velocity               84.4  cm/s     ---------  Mitral A-wave peak velocity               83.6  cm/s     ---------  Mitral deceleration time          (H)     264   ms       150 - 230  Mitral pressure half-time                 77    ms       ---------  Mitral peak gradient, D                   3     mm Hg    ---------  Mitral E/A ratio, peak                    1              ---------  Mitral valve area, PHT, DP                2.86  cm^2     ---------  Mitral valve area/bsa, PHT, DP            1.33  cm^2/m^2 ---------    Systemic veins                              Value          Reference  Estimated CVP                             3     mm Hg    ---------    Right ventricle                           Value          Reference  TAPSE                                     19.8  mm       ---------  RV s&', lateral, S                         9.45  cm/s     ---------  Legend: (L)  and  (H)  mark values outside specified reference range.  ------------------------------------------------------------------- Prepared and Electronically Authenticated by  Mark Skains, M.D. 2019-11-20T16:44:30   CORONARY ANGIOGRAPHY  RIGHT HEART CATH  Conclusion     Ost LM lesion is 30% stenosed.  Dist LM lesion is 30% stenosed.  Prox LAD lesion is 40% stenosed.  Ost Cx to Prox Cx lesion is 50% stenosed.  Mid RCA lesion is 80% stenosed.  Dist RCA-1 lesion is 95% stenosed.  Dist RCA-2 lesion is 80% stenosed.  Ost RPDA to RPDA lesion is 80% stenosed.   1. Severe single vessel CAD with severe diffuse stenosis of the RCA  and left-to-right collaterals supplying the PDA and PLA branches 2. Moderate ostial circumflex stenosis 3. Nonobstructive LAD stenosis 4. Normal right heart pressures  Recommend: continued multidisciplinary evaluation for treatment of severe aortic stenosis, CAD, and severe asymptomatic carotid stenosis   Indications   Severe aortic stenosis [I35.0 (ICD-10-CM)]  Procedural Details   Technical Details INDICATION: Severe aortic stenosis  PROCEDURAL DETAILS: There was an indwelling IV in a right antecubital vein. Using normal sterile technique, the IV was changed out for a 5 Fr brachial sheath over a 0.018 inch wire. The right wrist was then prepped, draped, and anesthetized with 1% lidocaine. Using the modified Seldinger technique a 5/6 French Slender sheath was placed in the right radial artery. Intra-arterial verapamil was administered through the radial artery sheath. IV heparin was administered after a JR4 catheter was advanced into the central aorta. A Swan-Ganz catheter was used for the right heart catheterization. Standard protocol was followed for recording of right heart pressures and sampling of oxygen saturations. Fick cardiac output was calculated. Standard Judkins catheters were used for selective coronary angiography. There were no immediate procedural complications. The patient was transferred to the post catheterization recovery area for further monitoring.    Estimated blood loss <50 mL.   During this procedure medications were administered to achieve and maintain moderate conscious sedation while the patient's heart rate, blood pressure, and oxygen saturation were continuously monitored and I was present face-to-face 100% of this time.  Medications  (Filter: Administrations occurring from 02/04/18 1016 to 02/04/18 1123)  Medication Rate/Dose/Volume Action  Date Time   fentaNYL (SUBLIMAZE) injection (mcg) 25 mcg Given 02/04/18 1030   Total dose as of 02/10/18 1621        25  mcg        midazolam (  VERSED) injection (mg) 2 mg Given 02/04/18 1030   Total dose as of 02/10/18 1621        2 mg        lidocaine (PF) (XYLOCAINE) 1 % injection (mL) 1 mL Given 02/04/18 1039   Total dose as of 02/10/18 1621 2 mL Given 1040   8 mL 5 mL Given 1120   Radial Cocktail/Verapamil only (mL) 10 mL Given 02/04/18 1041   Total dose as of 02/10/18 1621 10 mL Given 1105   20 mL        heparin injection (Units) 4,500 Units Given 02/04/18 1048   Total dose as of 02/10/18 1621        4,500 Units        Heparin (Porcine) in NaCl 1000-0.9 UT/500ML-% SOLN (mL) 500 mL Given 02/04/18 1120   Total dose as of 02/10/18 1621 500 mL Given 1120   1,000 mL        iopamidol (ISOVUE-370) 76 % injection (mL) 90 mL Given 02/04/18 1121   Total dose as of 02/10/18 1621        90 mL        Sedation Time   Sedation Time Physician-1: 36 minutes 31 seconds  Coronary Findings   Diagnostic  Dominance: Right  Left Main  Ost LM lesion 30% stenosed  Ost LM lesion is 30% stenosed.  Dist LM lesion 30% stenosed  Dist LM lesion is 30% stenosed.  Left Anterior Descending  Prox LAD lesion 40% stenosed  Prox LAD lesion is 40% stenosed.  Left Circumflex  Ost Cx to Prox Cx lesion 50% stenosed  Ost Cx to Prox Cx lesion is 50% stenosed. In some views there appears to be a tight ostial lesion, but multiple projections confirm a patent vessel with mild-moserate ostial stenosis and severe angulation at the origin.  Right Coronary Artery  Mid RCA lesion 80% stenosed  Mid RCA lesion is 80% stenosed.  Dist RCA-1 lesion 95% stenosed  Dist RCA-1 lesion is 95% stenosed.  Dist RCA-2 lesion 80% stenosed  Dist RCA-2 lesion is 80% stenosed.  Right Posterior Descending Artery  Collaterals  RPDA filled by collaterals from 1st Sept.    Ost RPDA to RPDA lesion 80% stenosed  Ost RPDA to RPDA lesion is 80% stenosed.  First Right Posterolateral  Collaterals  1st RPLB filled by collaterals from 2nd Sept.      Intervention   No interventions have been documented.  Coronary Diagrams   Diagnostic  Dominance: Right    Intervention   Implants    No implant documentation for this case.  Syngo Images   Show images for CARDIAC CATHETERIZATION  MERGE Images   Show images for CARDIAC CATHETERIZATION   Link to Procedure Log   Procedure Log    Hemo Data    Most Recent Value  Fick Cardiac Output 7.01 L/min  Fick Cardiac Output Index 3.3 (L/min)/BSA  RA A Wave 5 mmHg  RA V Wave 2 mmHg  RA Mean 1 mmHg  RV Systolic Pressure 21 mmHg  RV Diastolic Pressure -2 mmHg  RV EDP 4 mmHg  PA Systolic Pressure 24 mmHg  PA Diastolic Pressure 2 mmHg  PA Mean 13 mmHg  PW A Wave 8 mmHg  PW V Wave 6 mmHg  PW Mean 4 mmHg  AO Systolic Pressure 121 mmHg  AO Diastolic Pressure 60 mmHg  AO Mean 85 mmHg  QP/QS 1  TPVR Index 3.94 HRUI  TSVR Index 25.73 HRUI    PVR SVR Ratio 0.11  TPVR/TSVR Ratio 0.15     Cardiac TAVR CT  TECHNIQUE: The patient was scanned on a Phillips Force scanner. A 120 kV retrospective scan was triggered in the descending thoracic aorta at 111 HU's. Gantry rotation speed was 250 msecs and collimation was .6 mm. No beta blockade or nitro were given. The 3D data set was reconstructed in 5% intervals of the R-R cycle. Systolic and diastolic phases were analyzed on a dedicated work station using MPR, MIP and VRT modes. The patient received 80 cc of contrast.  FINDINGS: Aortic Valve: Trileaflet aortic valve with severely thickened and calcified leaflets and no calcifications extending into the LVOT.  Aorta: Normal size with only mild diffuse atherosclerotic plaque and calcifications and no dissection.  Sinotubular Junction: 30 x 29 mm  Ascending Thoracic Aorta: 38 x 36 mm  Aortic Arch: 26 x 26 mm  Descending Thoracic Aorta: 24 x 24 mm  Sinus of Valsalva Measurements:  Non-coronary: 30 mm  Right -coronary: 32 mm  Left -coronary: 32 mm  Coronary  Artery Height above Annulus:  Left Main: 19 mm  Right Coronary: 16 mm  Virtual Basal Annulus Measurements:  Maximum/Minimum Diameter: 25.8 x 23.0 mm  Mean Diameter: 24.4 mm  Perimeter: 77.8 mm  Area: 468 mm2  Optimum Fluoroscopic Angle for Delivery: LAO 12 CAU 10.  IMPRESSION: 1. Trileaflet aortic valve with severely thickened and calcified leaflets and no calcifications extending into the LVOT. Annular measurements suitable for delivery of a 26 mm Edwards-SAPIEN 3 valve.  2. Sufficient coronary to annulus distance.  3. Optimum Fluoroscopic Angle for Delivery: LAO 12 CAU 10.  4. No thrombus in the left atrial appendage.  Electronically Signed: By: Katarina  Nelson On: 02/05/2018 17:04   CT ANGIOGRAPHY CHEST, ABDOMEN AND PELVIS  TECHNIQUE: Multidetector CT imaging through the chest, abdomen and pelvis was performed using the standard protocol during bolus administration of intravenous contrast. Multiplanar reconstructed images and MIPs were obtained and reviewed to evaluate the vascular anatomy.  CONTRAST:  100mL ISOVUE-370 IOPAMIDOL (ISOVUE-370) INJECTION 76%  COMPARISON:  No priors.  FINDINGS: CTA CHEST FINDINGS  Cardiovascular: Heart size is mildly enlarged with concentric left ventricular hypertrophy. There is no significant pericardial fluid, thickening or pericardial calcification. There is aortic atherosclerosis, as well as atherosclerosis of the great vessels of the mediastinum and the coronary arteries, including calcified atherosclerotic plaque in the left main, left anterior descending and right coronary arteries. Severe thickening and calcification of the aortic valve.  Mediastinum/Lymph Nodes: No pathologically enlarged mediastinal or hilar lymph nodes. Esophagus is unremarkable in appearance. No axillary lymphadenopathy.  Lungs/Pleura: Multiple small pulmonary nodules scattered throughout both lungs measuring 5 mm or  less in size. No larger more suspicious appearing pulmonary nodules or masses are noted. No acute consolidative airspace disease. No pleural effusions.  Musculoskeletal/Soft Tissues: There are no aggressive appearing lytic or blastic lesions noted in the visualized portions of the skeleton.  CTA ABDOMEN AND PELVIS FINDINGS  Hepatobiliary: No suspicious cystic or solid hepatic lesions. No intra or extrahepatic biliary ductal dilatation. Gallbladder is normal in appearance.  Pancreas: No pancreatic mass. No pancreatic ductal dilatation. No pancreatic or peripancreatic fluid or inflammatory changes.  Spleen: Unremarkable.  Adrenals/Urinary Tract: Bilateral kidneys and bilateral adrenal glands are normal in appearance. No hydroureteronephrosis. Urinary bladder is normal in appearance.  Stomach/Bowel: Normal appearance of the stomach. No pathologic dilatation of small bowel or colon. The appendix is not confidently identified and may be surgically absent. Regardless, there   are no inflammatory changes noted adjacent to the cecum to suggest the presence of an acute appendicitis at this time.  Vascular/Lymphatic: Aortic atherosclerosis, without evidence of aneurysm or dissection in the abdominal or pelvic vasculature. Vascular findings and measurements pertinent to potential TAVR procedure, as detailed below. No lymphadenopathy noted in the abdomen or pelvis.  Reproductive: Prostate gland is mildly enlarged and heterogeneous in appearance measuring 4.8 x 5.2 cm. Seminal vesicles are unremarkable in appearance.  Other: No significant volume of ascites.  No pneumoperitoneum.  Musculoskeletal: There are no aggressive appearing lytic or blastic lesions noted in the visualized portions of the skeleton.  VASCULAR MEASUREMENTS PERTINENT TO TAVR:  AORTA:  Minimal Aortic Diameter-14 x 9 mm  Severity of Aortic Calcification-severe  RIGHT PELVIS:  Right Common  Iliac Artery -  Minimal Diameter-9.8 x 8.8 mm  Tortuosity-mild  Calcification-mild  Right External Iliac Artery -  Minimal Diameter-7.6 x 7.5 mm  Tortuosity - mild  Calcification - mild  Right Common Femoral Artery -  Minimal Diameter-7.1 x 5.5 mm  Tortuosity - mild  Calcification - mild  LEFT PELVIS:  Left Common Iliac Artery -  Minimal Diameter-9.7 x 7.5 mm  Tortuosity - mild  Calcification-mild  Left External Iliac Artery -  Minimal Diameter-7.1 x 6.8 mm  Tortuosity - mild  Calcification-none  Left Common Femoral Artery -  Minimal Diameter-5.8 x 2.9 mm  Tortuosity - mild  Calcification-severe  Review of the MIP images confirms the above findings.  IMPRESSION: 1. Vascular findings and measurements pertinent to potential TAVR procedure, as detailed above. 2. Severe thickening and calcification of the aortic valve, compatible with the reported clinical history of severe aortic stenosis. 3. Cardiomegaly with left ventricular concentric hypertrophy. 4. Aortic atherosclerosis, in addition to left main and 2 vessel coronary artery disease. 5. Multiple small pulmonary nodules scattered throughout both lungs measuring 5 mm or less in size, nonspecific. No follow-up needed if patient is low-risk (and has no known or suspected primary neoplasm). Non-contrast chest CT can be considered in 12 months if patient is high-risk. This recommendation follows the consensus statement: Guidelines for Management of Incidental Pulmonary Nodules Detected on CT Images: From the Fleischner Society 2017; Radiology 2017; 284:228-243. 6. Prostatomegaly. 7. Additional incidental findings, as above.   Electronically Signed   By: Daniel  Entrikin M.D.   On: 02/08/2018 11:12    EKG: NSR w/out significant AV conduction delay    STS Risk Calculator  Procedure: AVR + CAB CALCULATE   Risk of Mortality:  1.244% Renal Failure:   1.167% Permanent Stroke:  1.942% Prolonged Ventilation:  4.571% DSW Infection:  0.149% Reoperation:  3.179% Morbidity or Mortality:  8.103% Short Length of Stay:  47.972% Long Length of Stay:  2.997%    Impression:  Patient has stage D severe symptomatic aortic stenosis and single-vessel coronary artery disease.  He presents with progressive symptoms of exertional shortness of breath and fatigue consistent with chronic diastolic congestive heart failure, [place] Heart Association functional class I-II.  Recently also had an episode of severe shortness of breath and mild discomfort across his chest brought on by more strenuous physical exertion, possibly consistent with angina pectoris.  The symptoms were promptly relieved by rest.  I have personally reviewed the patient's recent transthoracic echocardiogram, diagnostic cardiac catheterization, CT angiograms, and carotid duplex scan.  The patient has quite severe aortic stenosis.  The aortic valve is trileaflet with severe thickening, calcification, and restricted leaflet mobility involving all 3 leaflets.  Peak velocity across   aortic valve measured 4.8 m/s, corresponding to mean transvalvular gradient estimated 50 mmHg.  Left ventricular systolic function remains normal.  Diagnostic cardiac catheterization reveals multivessel coronary artery disease including long segment diffuse stenosis of the right coronary artery with 2 discrete high-grade lesions of stenosis.  The vessel remains patent but there is left-to-right collateral filling of the terminal branches.  There is 40 to 50% ostial stenosis of the left circumflex coronary artery with otherwise nonobstructive disease in the left coronary circulation.  Right heart pressures were normal.  Carotid duplex scan demonstrates high-grade right internal carotid artery stenosis with moderate left internal carotid artery stenosis.  Velocities across the right internal carotid artery are quite  high.  I agree the patient needs aortic valve replacement.  He might benefit from revascularization of his right coronary artery as well.  Because of the severity of aortic stenosis he might be at risk for cardiac complications were he to undergo carotid endarterectomy under general anesthesia.  Given the presence of high-grade right internal carotid artery stenosis and moderate stenosis on the left, the patient might be at slightly higher risk of perioperative stroke with aortic valve replacement and coronary artery bypass grafting using conventional surgical techniques, although risks associated with conventional surgery would probably still be relatively low.  Cardiac-gated CTA of the heart reveals anatomical characteristics consistent with aortic stenosis suitable for treatment by transcatheter aortic valve replacement without any significant complicating features and CTA of the aorta and iliac vessels demonstrate what appears to be adequate pelvic vascular access to facilitate a transfemoral approach.  Under the circumstances, it might be reasonable to consider transcatheter aortic valve replacement prior to carotid endarterectomy.  PCI and stenting of the right coronary artery could be considered as well.    Plan:  The patient and his daughter were counseled at length regarding treatment alternatives for management of severe symptomatic aortic stenosis and coronary artery disease. Alternative approaches such as conventional aortic valve replacement with coronary artery bypass grafting, transcatheter aortic valve replacement with or without PCI and stenting of the right coronary artery, and continued medical therapy without intervention were compared and contrasted at length.  The influence of the presence of asymptomatic bilateral carotid artery disease with high-grade right internal carotid artery stenosis were discussed.  The risks associated with conventional surgery were discussed in detail, as  were expectations for post-operative convalescence.  Issues specific to transcatheter aortic valve replacement were discussed including questions about long term valve durability, the potential for paravalvular leak, possible increased risk of need for permanent pacemaker placement, and other technical complications related to the procedure itself.  Long-term prognosis with medical therapy was discussed. This discussion was placed in the context of the patient's own specific clinical presentation and past medical history.  All of their questions have been addressed.  The patient hopes to proceed with transcatheter aortic valve replacement in the near future once his dental extraction has been completed.  Following the decision to proceed with transcatheter aortic valve replacement, a discussion has been held regarding what types of management strategies would be attempted intraoperatively in the event of life-threatening complications, including whether or not the patient would be considered a candidate for the use of cardiopulmonary bypass and/or conversion to open sternotomy for attempted surgical intervention.  The patient has been advised of a variety of complications that might develop including but not limited to risks of death, stroke, paravalvular leak, aortic dissection or other major vascular complications, aortic annulus rupture, device embolization, cardiac rupture   or perforation, mitral regurgitation, acute myocardial infarction, arrhythmia, heart block or bradycardia requiring permanent pacemaker placement, congestive heart failure, respiratory failure, renal failure, pneumonia, infection, other late complications related to structural valve deterioration or migration, or other complications that might ultimately cause a temporary or permanent loss of functional independence or other long term morbidity.  The patient provides full informed consent for the procedure as described and all questions were  answered.   I spent in excess of 90 minutes during the conduct of this office consultation and >50% of this time involved direct face-to-face encounter with the patient for counseling and/or coordination of their care.    Misti Towle H. Acxel Dingee, MD 02/10/2018 3:22 PM   

## 2018-02-10 NOTE — Progress Notes (Signed)
[facility]  MULTIDISCIPLINARY HEART VALVE CLINIC  CARDIOTHORACIC SURGERY CONSULTATION REPORT  Referring Provider is Nahser, Deloris Ping, MD PCP is [name], Kateri Mc  Chief Complaint  Patient presents with  . Aortic Stenosis    new patient consultation, TAVR vs CABG/SAVR, review all studies    HPI:  Patient is a 73 year old male with history of aortic stenosis, coronary artery disease, cerebrovascular disease with high-grade asymptomatic right internal carotid artery stenosis, hypertension, and hypercholesterolemia who has been referred for surgical consultation to discuss treatment options for management of severe symptomatic aortic stenosis and coronary artery disease.  Patient's cardiac history dates back more than 5 years ago when he had 2 brief syncopal episodes.  He underwent carotid duplex scan which revealed moderate bilateral 60-79% internal carotid artery stenosis.  He was evaluated by Dr. Delton See and noted to have a heart murmur on exam.  Echocardiogram revealed normal left ventricular systolic function with mild to moderate aortic stenosis with mean transvalvular gradient estimated 19 mmHg.  He was referred for vascular surgical consultation and initially evaluated by Dr. Imogene Burn.  Medical therapy with follow-up was recommended.  He was lost to follow-up for several years but was referred back by his primary care physician for cardiology consultation and evaluated by Dr. Elease Hashimoto on November 11, 2017.  At that time the patient reported mild symptoms of exertional shortness of breath and he denied any further episodes of dizziness or near syncope.  Follow-up transthoracic echocardiogram performed November 25, 2017 revealed severe aortic stenosis with normal left ventricular systolic function.  Peak velocity across aortic valve measured 4.8 m/s corresponding to mean transvalvular gradient estimated 50 mmHg.  The DVI was 0.21 with aortic valve area calculated 0.81 cm.  Initially  continued observation was recommended.  However, shortly after that the patient was seen by Dr. Chestine Spore for follow-up of his bilateral carotid artery stenosis.  Carotid duplex scan performed January 12, 2018 revealed high-grade 80-99% right internal carotid artery stenosis.  There was 40 to 59% left internal carotid artery stenosis.  Right carotid endarterectomy was recommended and the patient was referred back to Dr. Elease Hashimoto for preoperative cardiac clearance.  The patient was subsequently referred to the multidisciplinary heart valve clinic and underwent left and right heart catheterization by Dr. Excell Seltzer on February 04, 2018.  Catheterization revealed multivessel coronary artery disease with severe diffuse high-grade multisegment stenosis of the right coronary artery and left to right collateral filling of the posterior descending coronary artery and right posterolateral branch.  There was moderate ostial stenosis of the left circumflex coronary artery and nonobstructive disease in the left anterior descending coronary artery.  Right heart pressures were normal.  CT angiography was performed and the patient was referred for surgical consultation.  He was also referred for dental service consultation and has been evaluated earlier this week by Dr. Robin Searing.  Dental extraction has been recommended and the patient has been referred to Dr. Chales Salmon.  Patient is widowed and lives alone in [place].  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  He remains entirely functionally independent and reasonably active for his age.  He does complain that over the past 6 months he has developed progressive exertional shortness of breath and fatigue.  He gets tired much more easily than he used to in the past.  He states that recently when he was at work he was hurrying to take care of something and he got severe shortness of breath and vague pressure across his chest.  His symptoms resolved quickly when he stopped to rest.  He denies  any resting shortness of breath, PND, orthopnea, or lower extremity edema.  He has not had any recent dizzy spells or near syncope.  Past Medical History:  Diagnosis Date  . Abnormal liver function test   . Acute bronchitis   . Allergy   . Aortic valve disorders   . Broken ankle    Right  . Cardiac murmur   . Carotid artery occlusion    Bilateral Bruit  . Cataract   . Chicken pox   . Chronic rhinitis   . Coronary artery disease involving native coronary artery of native heart without angina pectoris   . DJD (degenerative joint disease)   . GERD (gastroesophageal reflux disease)   . History of UTI   . Hypercholesteremia   . Hypertension   . Lumbar back pain   . Overweight(278.02)     Past Surgical History:  Procedure Laterality Date  . APPENDECTOMY    . COLONOSCOPY    . CORONARY ANGIOGRAPHY N/A 02/04/2018   Procedure: CORONARY ANGIOGRAPHY;  Surgeon: Tonny Bollman, MD;  Location: [facility] INVASIVE CV LAB;  Service: Cardiovascular;  Laterality: N/A;  . RIGHT HEART CATH N/A 02/04/2018   Procedure: RIGHT HEART CATH;  Surgeon: Tonny Bollman, MD;  Location[facility]VASIVE CV LAB;  Service: Cardiovascular;  Laterality: N/A;    Family History  Problem Relation Age of Onset  . Colon cancer Father        metastatic  . Other Father        DJD  . Arthritis Father 70  . Healthy Mother        Living-87  . Prostate cancer Brother   . Heart attack Paternal Grandfather   . Heart disease Paternal Grandfather   . Cancer Paternal Uncle   . Colon cancer Sister   . Hypertension Daughter        x1  . Healthy Daughter        x1  . Esophageal cancer Neg Hx   . Stomach cancer Neg Hx   . Rectal cancer Neg Hx     Social History   Socioeconomic History  . Marital status: Widowed    Spouse name: stacey(deceased 2009/06/09)  . Number of children: 1  . Years of education: Not on file  . Highest education level: Not on file  Occupational History  . Occupation: welder  Social Needs  . Financial  resource strain: Not on file  . Food insecurity:    Worry: Not on file    Inability: Not on file  . Transportation needs:    Medical: Not on file    Non-medical: Not on file  Tobacco Use  . Smoking status: Former Smoker    Packs/day: 1.00    Years: 15.00    Pack years: 15.00    Types: Cigarettes    Last attempt to quit: 01/06/1989    Years since quitting: 29.1  . Smokeless tobacco: Former Neurosurgeon    Types: Snuff  . Tobacco comment: chewed some in early 06-10-22  Substance and Sexual Activity  . Alcohol use: No  . Drug use: No  . Sexual activity: Not on file  Lifestyle  . Physical activity:    Days per week: Not on file    Minutes per session: Not on file  . Stress: Not on file  Relationships  . Social connections:    Talks on phone: Not on file    Gets together: Not  on file    Attends religious service: Not on file    Active member of club or organization: Not on file    Attends meetings of clubs or organizations: Not on file    Relationship status: Not on file  . Intimate partner violence:    Fear of current or ex partner: Not on file    Emotionally abused: Not on file    Physically abused: Not on file    Forced sexual activity: Not on file  Other Topics Concern  . Not on file  Social History Narrative   Uncle with prostate cancer?    Current Outpatient Medications  Medication Sig Dispense Refill  . acetaminophen (TYLENOL) 650 MG CR tablet Take 650-1,300 mg by mouth every 8 (eight) hours as needed for pain (legs cramps.).    Marland Kitchen amoxicillin (AMOXIL) 500 MG capsule Take four capsules one hour before dental appointment. 4 capsule 1  . aspirin EC 81 MG tablet Take 81 mg by mouth daily.    Marland Kitchen atorvastatin (LIPITOR) 20 MG tablet Take 1 tablet (20 mg total) by mouth daily. 90 tablet 0  . cyclobenzaprine (FLEXERIL) 10 MG tablet Take 10 mg by mouth 3 (three) times daily as needed for muscle spasms.    . fluticasone (FLONASE) 50 MCG/ACT nasal spray Place 2 sprays into both nostrils  as needed. 16 g 11  . lisinopril (PRINIVIL,ZESTRIL) 5 MG tablet TAKE 1 TABLET BY MOUTH EVERY DAY 90 tablet 1   No current facility-administered medications for this visit.     No Known Allergies    Review of Systems:   General:  normal appetite, decreased energy, no weight gain, or weight loss, no fever  Cardiac:  1 episode chest pain with exertion, no chest pain at rest, +SOB with exertion, no resting SOB, no PND, no orthopnea, no palpitations, no arrhythmia, no atrial fibrillation, no LE edema, no dizzy spells, no syncope  Respiratory:  + exertional shortness of breath, no home oxygen, no productive cough, no dry cough, no bronchitis, no wheezing, no hemoptysis, no asthma, no pain with inspiration or cough, no sleep apnea, no CPAP at night  GI:   no difficulty swallowing, no reflux, no frequent heartburn, no hiatal hernia, no abdominal pain, no constipation, no diarrhea, no hematochezia, no hematemesis, no melena  GU:   no dysuria,  occasional frequency, no urinary tract infection, no hematuria, no enlarged prostate, no kidney stones, no kidney disease  Vascular:  no pain suggestive of claudication, no pain in feet, no leg cramps, no varicose veins, no DVT, no non-healing foot ulcer  Neuro:   no stroke, no TIA's, no seizures, no headaches, no temporary blindness one eye,  no slurred speech, no peripheral neuropathy, no chronic pain, no instability of gait, no memory/cognitive dysfunction  Musculoskeletal: + arthritis, no joint swelling, no myalgias, no difficulty walking, normal mobility   Skin:   no rash, no itching, no skin infections, no pressure sores or ulcerations  Psych:   no anxiety, no depression, no nervousness, no unusual recent stress  Eyes:   no blurry vision, no floaters, no recent vision changes, + wears glasses or contacts  ENT:   no hearing loss, + loose or painful teeth, no dentures, last saw dentist yesterday  Hematologic:  no easy bruising, no abnormal bleeding, no  clotting disorder, no frequent epistaxis  Endocrine:  no diabetes, does not check CBG's at home           Physical Exam:   BP  118/72 (BP Location: Right Arm, Patient Position: Sitting, Cuff Size: Large)   Pulse 66   Resp 18   Ht 5\' 11"  (1.803 m)   Wt 203 lb 6.4 oz (92.3 kg)   SpO2 97% Comment: RA  BMI 28.37 kg/m   General:    well-appearing  HEENT:  Unremarkable   Neck:   no JVD, bilateral carotid bruits R>L, no adenopathy   Chest:   clear to auscultation, symmetrical breath sounds, no wheezes, no rhonchi   CV:   RRR, grade IV/VI crescendo/decrescendo murmur heard best at RSB,  no diastolic murmur  Abdomen:  soft, non-tender, no masses   Extremities:  warm, well-perfused, pulses palpable, no LE edema  Rectal/GU  Deferred  Neuro:   Grossly non-focal and symmetrical throughout  Skin:   Clean and dry, no rashes, no breakdown   Diagnostic Tests:  Echocardiography  Patient:    Halo, Laski MR #:       [id number] Study Date: 11/25/2017 Gender:     M Age:        72 Height:     180.3 cm Weight:     90.7 kg BSA:        2.15 m^2 Pt. Status: Room:   REFERRING    Kristeen Miss, M.D.  ATTENDING    Donato Schultz, M.D.  SONOGRAPHER  Dewitt Hoes, RDCS  PERFORMING   [facility]  ORDERING     Nahser, Jr  REFERRING    Nahser, Jr  cc:  ------------------------------------------------------------------- LV EF: 65% -   70%  ------------------------------------------------------------------- Indications:      Aortic stenosis (I35).  ------------------------------------------------------------------- History:   PMH:   Murmur.  Aortic valve disease.  Risk factors: Carotid stenosis. Hypertension. Dyslipidemia.  ------------------------------------------------------------------- Study Conclusions  - Left ventricle: The cavity size was normal. Systolic function was   vigorous. The estimated ejection fraction was in the range of 65%   to 70%. Wall motion was  normal; there were no regional wall   motion abnormalities. Doppler parameters are consistent with   abnormal left ventricular relaxation (grade 1 diastolic   dysfunction). GLS: -12.3% - Aortic valve: Valve mobility was restricted. There was severe   stenosis. There was trivial regurgitation. Mean gradient (S): 50   mm Hg. Peak gradient (S): 93 mm Hg. - Aorta: Ascending aortic diameter: 39 mm (S). - Ascending aorta: The ascending aorta was mildly dilated.  Impressions:  - Aortic stenosis is now severe.  ------------------------------------------------------------------- Labs, prior tests, procedures, and surgery: Transthoracic echocardiography (06/07/2013).    The aortic valve showed moderate stenosis and no significant regurgitation.  EF was 60% and PA pressure was 26 (systolic). Aortic valve: peak gradient of 41 mm Hg and mean gradient of 23 mm Hg.  ------------------------------------------------------------------- Study data:  Strain imaging. Comparison was made to the study of 06/07/2013.  Study status:  Routine.  Procedure:  The patient reported no pain pre or post test. Transthoracic echocardiography. Image quality was adequate.  Study completion:  There were no complications.          Echocardiography.  M-mode, complete 2D, spectral Doppler, and color Doppler.  Birthdate:  Patient birthdate: December 27, 1945.  Age:  Patient is 73 yr old.  Sex:  Gender: male.    BMI: 27.9 kg/m^2.  Blood pressure:     130/74  Patient status:  Outpatient.  Study date:  Study date: 11/25/2017. Study time: 01:28 PM.  Location:  [facility]  -------------------------------------------------------------------  ------------------------------------------------------------------- Left ventricle:  The cavity  size was normal. Systolic function was vigorous. The estimated ejection fraction was in the range of 65% to 70%. Wall motion was normal; there were no regional wall motion abnormalities.  GLS: -12.3% Doppler parameters are consistent with abnormal left ventricular relaxation (grade 1 diastolic dysfunction).  ------------------------------------------------------------------- Aortic valve:  Poorly visualized.  Trileaflet; severely thickened, severely calcified leaflets. Valve mobility was restricted. Doppler:   There was severe stenosis.   There was trivial regurgitation.    VTI ratio of LVOT to aortic valve: 0.25. Valve area (VTI): 0.81 cm^2. Indexed valve area (VTI): 0.38 cm^2/m^2. Peak velocity ratio of LVOT to aortic valve: 0.21. Valve area (Vmax): 0.64 cm^2. Indexed valve area (Vmax): 0.3 cm^2/m^2. Mean velocity ratio of LVOT to aortic valve: 0.22. Valve area (Vmean): 0.7 cm^2. Indexed valve area (Vmean): 0.32 cm^2/m^2.    Mean gradient (S): 50 mm Hg. Peak gradient (S): 93 mm Hg.  ------------------------------------------------------------------- Aorta:  Ascending aorta: The ascending aorta was mildly dilated.  ------------------------------------------------------------------- Mitral valve:   Structurally normal valve.   Mobility was not restricted.  Doppler:  Transvalvular velocity was within the normal range. There was no evidence for stenosis. There was no regurgitation.    Valve area by pressure half-time: 2.86 cm^2. Indexed valve area by pressure half-time: 1.33 cm^2/m^2.    Peak gradient (D): 3 mm Hg.  ------------------------------------------------------------------- Left atrium:  The atrium was normal in size.  ------------------------------------------------------------------- Right ventricle:  The cavity size was normal. Wall thickness was normal. Systolic function was normal.  ------------------------------------------------------------------- Pulmonic valve:    Doppler:  Transvalvular velocity was within the normal range. There was no evidence for stenosis.  ------------------------------------------------------------------- Tricuspid  valve:   Structurally normal valve.    Doppler: Transvalvular velocity was within the normal range. There was no regurgitation.  ------------------------------------------------------------------- Pulmonary artery:   The main pulmonary artery was normal-sized. Systolic pressure was within the normal range.  ------------------------------------------------------------------- Right atrium:  The atrium was normal in size.  ------------------------------------------------------------------- Pericardium:  There was no pericardial effusion.  ------------------------------------------------------------------- Systemic veins: Inferior vena cava: The vessel was normal in size.  ------------------------------------------------------------------- Measurements   Left ventricle                            Value          Reference  LV ID, ED, PLAX chordal           (L)     40    mm       43 - 52  LV ID, ES, PLAX chordal                   26    mm       23 - 38  LV fx shortening, PLAX chordal            35    %        >=29  LV PW thickness, ED                       14    mm       ---------  IVS/LV PW ratio, ED                       1.07           <=1.3  Stroke volume, 2D  90    ml       ---------  Stroke volume/bsa, 2D                     42    ml/m^2   ---------  LV e&', lateral                            6.08  cm/s     ---------  LV E/e&', lateral                          13.88          ---------  LV e&', medial                             4.43  cm/s     ---------  LV E/e&', medial                           19.05          ---------  LV e&', average                            5.26  cm/s     ---------  LV E/e&', average                          16.06          ---------    Ventricular septum                        Value          Reference  IVS thickness, ED                         15    mm       ---------    LVOT                                      Value           Reference  LVOT ID, S                                20    mm       ---------  LVOT area                                 3.14  cm^2     ---------  LVOT peak velocity, S                     101   cm/s     ---------  LVOT mean velocity, S                     73.8  cm/s     ---------  LVOT VTI, S  28.8  cm       ---------    Aortic valve                              Value          Reference  Aortic valve peak velocity, S             481   cm/s     ---------  Aortic valve mean velocity, S             333   cm/s     ---------  Aortic valve VTI, S                       115   cm       ---------  Aortic mean gradient, S                   50    mm Hg    ---------  Aortic peak gradient, S                   93    mm Hg    ---------  VTI ratio, LVOT/AV                        0.25           ---------  Aortic valve area, VTI                    0.81  cm^2     ---------  Aortic valve area/bsa, VTI                0.38  cm^2/m^2 ---------  Velocity ratio, peak, LVOT/AV             0.21           ---------  Aortic valve area, peak velocity          0.64  cm^2     ---------  Aortic valve area/bsa, peak               0.3   cm^2/m^2 ---------  velocity  Velocity ratio, mean, LVOT/AV             0.22           ---------  Aortic valve area, mean velocity          0.7   cm^2     ---------  Aortic valve area/bsa, mean               0.32  cm^2/m^2 ---------  velocity    Aorta                                     Value          Reference  Aortic root ID, ED                        34    mm       ---------  Ascending aorta ID, A-P, S                39    mm       ---------    Left atrium  Value          Reference  LA ID, A-P, ES                            37    mm       ---------  LA ID/bsa, A-P                            1.72  cm/m^2   <=2.2  LA volume, S                              65.7  ml       ---------  LA volume/bsa, S                          30.6   ml/m^2   ---------  LA volume, ES, 1-p A4C                    73    ml       ---------  LA volume/bsa, ES, 1-p A4C                34    ml/m^2   ---------  LA volume, ES, 1-p A2C                    54.6  ml       ---------  LA volume/bsa, ES, 1-p A2C                25.4  ml/m^2   ---------    Mitral valve                              Value          Reference  Mitral E-wave peak velocity               84.4  cm/s     ---------  Mitral A-wave peak velocity               83.6  cm/s     ---------  Mitral deceleration time          (H)     264   ms       150 - 230  Mitral pressure half-time                 77    ms       ---------  Mitral peak gradient, D                   3     mm Hg    ---------  Mitral E/A ratio, peak                    1              ---------  Mitral valve area, PHT, DP                2.86  cm^2     ---------  Mitral valve area/bsa, PHT, DP            1.33  cm^2/m^2 ---------    Systemic veins  Value          Reference  Estimated CVP                             3     mm Hg    ---------    Right ventricle                           Value          Reference  TAPSE                                     19.8  mm       ---------  RV s&', lateral, S                         9.45  cm/s     ---------  Legend: (L)  and  (H)  mark values outside specified reference range.  ------------------------------------------------------------------- Prepared and Electronically Authenticated by  Donato Schultz, M.D. 2019-11-20T16:44:30   CORONARY ANGIOGRAPHY  RIGHT HEART CATH  Conclusion     Ost LM lesion is 30% stenosed.  Dist LM lesion is 30% stenosed.  Prox LAD lesion is 40% stenosed.  Ost Cx to Prox Cx lesion is 50% stenosed.  Mid RCA lesion is 80% stenosed.  Dist RCA-1 lesion is 95% stenosed.  Dist RCA-2 lesion is 80% stenosed.  Ost RPDA to RPDA lesion is 80% stenosed.   1. Severe single vessel CAD with severe diffuse stenosis of the RCA  and left-to-right collaterals supplying the PDA and PLA branches 2. Moderate ostial circumflex stenosis 3. Nonobstructive LAD stenosis 4. Normal right heart pressures  Recommend: continued multidisciplinary evaluation for treatment of severe aortic stenosis, CAD, and severe asymptomatic carotid stenosis   Indications   Severe aortic stenosis [I35.0 (ICD-10-CM)]  Procedural Details   Technical Details INDICATION: Severe aortic stenosis  PROCEDURAL DETAILS: There was an indwelling IV in a right antecubital vein. Using normal sterile technique, the IV was changed out for a 5 Fr brachial sheath over a 0.018 inch wire. The right wrist was then prepped, draped, and anesthetized with 1% lidocaine. Using the modified Seldinger technique a 5/6 French Slender sheath was placed in the right radial artery. Intra-arterial verapamil was administered through the radial artery sheath. IV heparin was administered after a JR4 catheter was advanced into the central aorta. A Swan-Ganz catheter was used for the right heart catheterization. Standard protocol was followed for recording of right heart pressures and sampling of oxygen saturations. Fick cardiac output was calculated. Standard Judkins catheters were used for selective coronary angiography. There were no immediate procedural complications. The patient was transferred to the post catheterization recovery area for further monitoring.    Estimated blood loss <50 mL.   During this procedure medications were administered to achieve and maintain moderate conscious sedation while the patient's heart rate, blood pressure, and oxygen saturation were continuously monitored and I was present face-to-face 100% of this time.  Medications  (Filter: Administrations occurring from 02/04/18 1016 to 02/04/18 1123)  Medication Rate/Dose/Volume Action  Date Time   fentaNYL (SUBLIMAZE) injection (mcg) 25 mcg Given 02/04/18 1030   Total dose as of 02/10/18 1621        25  mcg        midazolam (  VERSED) injection (mg) 2 mg Given 02/04/18 1030   Total dose as of 02/10/18 1621        2 mg        lidocaine (PF) (XYLOCAINE) 1 % injection (mL) 1 mL Given 02/04/18 1039   Total dose as of 02/10/18 1621 2 mL Given 1040   8 mL 5 mL Given 1120   Radial Cocktail/Verapamil only (mL) 10 mL Given 02/04/18 1041   Total dose as of 02/10/18 1621 10 mL Given 1105   20 mL        heparin injection (Units) 4,500 Units Given 02/04/18 1048   Total dose as of 02/10/18 1621        4,500 Units        Heparin (Porcine) in NaCl 1000-0.9 UT/500ML-% SOLN (mL) 500 mL Given 02/04/18 1120   Total dose as of 02/10/18 1621 500 mL Given 1120   1,000 mL        iopamidol (ISOVUE-370) 76 % injection (mL) 90 mL Given 02/04/18 1121   Total dose as of 02/10/18 1621        90 mL        Sedation Time   Sedation Time Physician-1: 36 minutes 31 seconds  Coronary Findings   Diagnostic  Dominance: Right  Left Main  Ost LM lesion 30% stenosed  Ost LM lesion is 30% stenosed.  Dist LM lesion 30% stenosed  Dist LM lesion is 30% stenosed.  Left Anterior Descending  Prox LAD lesion 40% stenosed  Prox LAD lesion is 40% stenosed.  Left Circumflex  Ost Cx to Prox Cx lesion 50% stenosed  Ost Cx to Prox Cx lesion is 50% stenosed. In some views there appears to be a tight ostial lesion, but multiple projections confirm a patent vessel with mild-moserate ostial stenosis and severe angulation at the origin.  Right Coronary Artery  Mid RCA lesion 80% stenosed  Mid RCA lesion is 80% stenosed.  Dist RCA-1 lesion 95% stenosed  Dist RCA-1 lesion is 95% stenosed.  Dist RCA-2 lesion 80% stenosed  Dist RCA-2 lesion is 80% stenosed.  Right Posterior Descending Artery  Collaterals  RPDA filled by collaterals from 1st Sept.    Ost RPDA to RPDA lesion 80% stenosed  Ost RPDA to RPDA lesion is 80% stenosed.  First Right Posterolateral  Collaterals  1st RPLB filled by collaterals from 2nd Sept.      Intervention   No interventions have been documented.  Coronary Diagrams   Diagnostic  Dominance: Right    Intervention   Implants    No implant documentation for this case.  Syngo Images   Show images for CARDIAC CATHETERIZATION  MERGE Images   Show images for CARDIAC CATHETERIZATION   Link to Procedure Log   Procedure Log    Hemo Data    Most Recent Value  Fick Cardiac Output 7.01 L/min  Fick Cardiac Output Index 3.3 (L/min)/BSA  RA A Wave 5 mmHg  RA V Wave 2 mmHg  RA Mean 1 mmHg  RV Systolic Pressure 21 mmHg  RV Diastolic Pressure -2 mmHg  RV EDP 4 mmHg  PA Systolic Pressure 24 mmHg  PA Diastolic Pressure 2 mmHg  PA Mean 13 mmHg  PW A Wave 8 mmHg  PW V Wave 6 mmHg  PW Mean 4 mmHg  AO Systolic Pressure 121 mmHg  AO Diastolic Pressure 60 mmHg  AO Mean 85 mmHg  QP/QS 1  TPVR Index 3.94 HRUI  TSVR Index 25.73 HRUI  PVR SVR Ratio 0.11  TPVR/TSVR Ratio 0.15     Cardiac TAVR CT  TECHNIQUE: The patient was scanned on a [employer]. A 120 kV retrospective scan was triggered in the descending thoracic aorta at 111 HU's. Gantry rotation speed was 250 msecs and collimation was .6 mm. No beta blockade or nitro were given. The 3D data set was reconstructed in 5% intervals of the R-R cycle. Systolic and diastolic phases were analyzed on a dedicated work station using MPR, MIP and VRT modes. The patient received 80 cc of contrast.  FINDINGS: Aortic Valve: Trileaflet aortic valve with severely thickened and calcified leaflets and no calcifications extending into the LVOT.  Aorta: Normal size with only mild diffuse atherosclerotic plaque and calcifications and no dissection.  Sinotubular Junction: 30 x 29 mm  Ascending Thoracic Aorta: 38 x 36 mm  Aortic Arch: 26 x 26 mm  Descending Thoracic Aorta: 24 x 24 mm  Sinus of Valsalva Measurements:  Non-coronary: 30 mm  Right -coronary: 32 mm  Left -coronary: 32 mm  Coronary  Artery Height above Annulus:  Left Main: 19 mm  Right Coronary: 16 mm  Virtual Basal Annulus Measurements:  Maximum/Minimum Diameter: 25.8 x 23.0 mm  Mean Diameter: 24.4 mm  Perimeter: 77.8 mm  Area: 468 mm2  Optimum Fluoroscopic Angle for Delivery: LAO 12 CAU 10.  IMPRESSION: 1. Trileaflet aortic valve with severely thickened and calcified leaflets and no calcifications extending into the LVOT. Annular measurements suitable for delivery of a 26 mm Edwards-SAPIEN 3 valve.  2. Sufficient coronary to annulus distance.  3. Optimum Fluoroscopic Angle for Delivery: LAO 12 CAU 10.  4. No thrombus in the left atrial appendage.  Electronically Signed: By: Tobias Alexander On: 02/05/2018 17:04   CT ANGIOGRAPHY CHEST, ABDOMEN AND PELVIS  TECHNIQUE: Multidetector CT imaging through the chest, abdomen and pelvis was performed using the standard protocol during bolus administration of intravenous contrast. Multiplanar reconstructed images and MIPs were obtained and reviewed to evaluate the vascular anatomy.  CONTRAST:  ISOVUE-370 IOPAMIDOL (ISOVUE-370) INJECTION 76%  COMPARISON:  No priors.  FINDINGS: CTA CHEST FINDINGS  Cardiovascular: Heart size is mildly enlarged with concentric left ventricular hypertrophy. There is no significant pericardial fluid, thickening or pericardial calcification. There is aortic atherosclerosis, as well as atherosclerosis of the great vessels of the mediastinum and the coronary arteries, including calcified atherosclerotic plaque in the left main, left anterior descending and right coronary arteries. Severe thickening and calcification of the aortic valve.  Mediastinum/Lymph Nodes: No pathologically enlarged mediastinal or hilar lymph nodes. Esophagus is unremarkable in appearance. No axillary lymphadenopathy.  Lungs/Pleura: Multiple small pulmonary nodules scattered throughout both lungs measuring 5 mm or  less in size. No larger more suspicious appearing pulmonary nodules or masses are noted. No acute consolidative airspace disease. No pleural effusions.  Musculoskeletal/Soft Tissues: There are no aggressive appearing lytic or blastic lesions noted in the visualized portions of the skeleton.  CTA ABDOMEN AND PELVIS FINDINGS  Hepatobiliary: No suspicious cystic or solid hepatic lesions. No intra or extrahepatic biliary ductal dilatation. Gallbladder is normal in appearance.  Pancreas: No pancreatic mass. No pancreatic ductal dilatation. No pancreatic or peripancreatic fluid or inflammatory changes.  Spleen: Unremarkable.  Adrenals/Urinary Tract: Bilateral kidneys and bilateral adrenal glands are normal in appearance. No hydroureteronephrosis. Urinary bladder is normal in appearance.  Stomach/Bowel: Normal appearance of the stomach. No pathologic dilatation of small bowel or colon. The appendix is not confidently identified and may be surgically absent. Regardless, there  are no inflammatory changes noted adjacent to the cecum to suggest the presence of an acute appendicitis at this time.  Vascular/Lymphatic: Aortic atherosclerosis, without evidence of aneurysm or dissection in the abdominal or pelvic vasculature. Vascular findings and measurements pertinent to potential TAVR procedure, as detailed below. No lymphadenopathy noted in the abdomen or pelvis.  Reproductive: Prostate gland is mildly enlarged and heterogeneous in appearance measuring 4.8 x 5.2 cm. Seminal vesicles are unremarkable in appearance.  Other: No significant volume of ascites.  No pneumoperitoneum.  Musculoskeletal: There are no aggressive appearing lytic or blastic lesions noted in the visualized portions of the skeleton.  VASCULAR MEASUREMENTS PERTINENT TO TAVR:  AORTA:  Minimal Aortic Diameter-14 x 9 mm  Severity of Aortic Calcification-severe  RIGHT PELVIS:  Right Common  Iliac Artery -  Minimal Diameter-9.8 x 8.8 mm  Tortuosity-mild  Calcification-mild  Right External Iliac Artery -  Minimal Diameter-7.6 x 7.5 mm  Tortuosity - mild  Calcification - mild  Right Common Femoral Artery -  Minimal Diameter-7.1 x 5.5 mm  Tortuosity - mild  Calcification - mild  LEFT PELVIS:  Left Common Iliac Artery -  Minimal Diameter-9.7 x 7.5 mm  Tortuosity - mild  Calcification-mild  Left External Iliac Artery -  Minimal Diameter-7.1 x 6.8 mm  Tortuosity - mild  Calcification-none  Left Common Femoral Artery -  Minimal Diameter-5.8 x 2.9 mm  Tortuosity - mild  Calcification-severe  Review of the MIP images confirms the above findings.  IMPRESSION: 1. Vascular findings and measurements pertinent to potential TAVR procedure, as detailed above. 2. Severe thickening and calcification of the aortic valve, compatible with the reported clinical history of severe aortic stenosis. 3. Cardiomegaly with left ventricular concentric hypertrophy. 4. Aortic atherosclerosis, in addition to left main and 2 vessel coronary artery disease. 5. Multiple small pulmonary nodules scattered throughout both lungs measuring 5 mm or less in size, nonspecific. No follow-up needed if patient is low-risk (and has no known or suspected primary neoplasm). Non-contrast chest CT can be considered in 12 months if patient is high-risk. This recommendation follows the consensus statement: Guidelines for Management of Incidental Pulmonary Nodules Detected on CT Images: From the Fleischner Society 2017; Radiology 2017; 284:228-243. 6. Prostatomegaly. 7. Additional incidental findings, as above.   Electronically Signed   By: Trudie Reed M.D.   On: 02/08/2018 11:12    EKG: NSR w/out significant AV conduction delay    STS Risk Calculator  Procedure: AVR + CAB CALCULATE   Risk of Mortality:  1.244% Renal Failure:   1.167% Permanent Stroke:  1.942% Prolonged Ventilation:  4.571% DSW Infection:  0.149% Reoperation:  3.179% Morbidity or Mortality:  8.103% Short Length of Stay:  47.972% Long Length of Stay:  2.997%    Impression:  Patient has stage D severe symptomatic aortic stenosis and single-vessel coronary artery disease.  He presents with progressive symptoms of exertional shortness of breath and fatigue consistent with chronic diastolic congestive heart failure, New York Heart Association functional class I-II.  Recently also had an episode of severe shortness of breath and mild discomfort across his chest brought on by more strenuous physical exertion, possibly consistent with angina pectoris.  The symptoms were promptly relieved by rest.  I have personally reviewed the patient's recent transthoracic echocardiogram, diagnostic cardiac catheterization, CT angiograms, and carotid duplex scan.  The patient has quite severe aortic stenosis.  The aortic valve is trileaflet with severe thickening, calcification, and restricted leaflet mobility involving all 3 leaflets.  Peak velocity across  aortic valve measured 4.8 m/s, corresponding to mean transvalvular gradient estimated 50 mmHg.  Left ventricular systolic function remains normal.  Diagnostic cardiac catheterization reveals multivessel coronary artery disease including long segment diffuse stenosis of the right coronary artery with 2 discrete high-grade lesions of stenosis.  The vessel remains patent but there is left-to-right collateral filling of the terminal branches.  There is 40 to 50% ostial stenosis of the left circumflex coronary artery with otherwise nonobstructive disease in the left coronary circulation.  Right heart pressures were normal.  Carotid duplex scan demonstrates high-grade right internal carotid artery stenosis with moderate left internal carotid artery stenosis.  Velocities across the right internal carotid artery are quite  high.  I agree the patient needs aortic valve replacement.  He might benefit from revascularization of his right coronary artery as well.  Because of the severity of aortic stenosis he might be at risk for cardiac complications were he to undergo carotid endarterectomy under general anesthesia.  Given the presence of high-grade right internal carotid artery stenosis and moderate stenosis on the left, the patient might be at slightly higher risk of perioperative stroke with aortic valve replacement and coronary artery bypass grafting using conventional surgical techniques, although risks associated with conventional surgery would probably still be relatively low.  Cardiac-gated CTA of the heart reveals anatomical characteristics consistent with aortic stenosis suitable for treatment by transcatheter aortic valve replacement without any significant complicating features and CTA of the aorta and iliac vessels demonstrate what appears to be adequate pelvic vascular access to facilitate a transfemoral approach.  Under the circumstances, it might be reasonable to consider transcatheter aortic valve replacement prior to carotid endarterectomy.  PCI and stenting of the right coronary artery could be considered as well.    Plan:  The patient and his daughter were counseled at length regarding treatment alternatives for management of severe symptomatic aortic stenosis and coronary artery disease. Alternative approaches such as conventional aortic valve replacement with coronary artery bypass grafting, transcatheter aortic valve replacement with or without PCI and stenting of the right coronary artery, and continued medical therapy without intervention were compared and contrasted at length.  The influence of the presence of asymptomatic bilateral carotid artery disease with high-grade right internal carotid artery stenosis were discussed.  The risks associated with conventional surgery were discussed in detail, as  were expectations for post-operative convalescence.  Issues specific to transcatheter aortic valve replacement were discussed including questions about long term valve durability, the potential for paravalvular leak, possible increased risk of need for permanent pacemaker placement, and other technical complications related to the procedure itself.  Long-term prognosis with medical therapy was discussed. This discussion was placed in the context of the patient's own specific clinical presentation and past medical history.  All of their questions have been addressed.  The patient hopes to proceed with transcatheter aortic valve replacement in the near future once his dental extraction has been completed.  Following the decision to proceed with transcatheter aortic valve replacement, a discussion has been held regarding what types of management strategies would be attempted intraoperatively in the event of life-threatening complications, including whether or not the patient would be considered a candidate for the use of cardiopulmonary bypass and/or conversion to open sternotomy for attempted surgical intervention.  The patient has been advised of a variety of complications that might develop including but not limited to risks of death, stroke, paravalvular leak, aortic dissection or other major vascular complications, aortic annulus rupture, device embolization, cardiac rupture  or perforation, mitral regurgitation, acute myocardial infarction, arrhythmia, heart block or bradycardia requiring permanent pacemaker placement, congestive heart failure, respiratory failure, renal failure, pneumonia, infection, other late complications related to structural valve deterioration or migration, or other complications that might ultimately cause a temporary or permanent loss of functional independence or other long term morbidity.  The patient provides full informed consent for the procedure as described and all questions were  answered.   I spent in excess of 90 minutes during the conduct of this office consultation and >50% of this time involved direct face-to-face encounter with the patient for counseling and/or coordination of their care.    Salvatore Decentlarence H. Cornelius Moraswen, MD 02/10/2018 3:22 PM

## 2018-02-16 ENCOUNTER — Ambulatory Visit (HOSPITAL_COMMUNITY): Payer: Self-pay | Admitting: Dentistry

## 2018-02-16 ENCOUNTER — Encounter (HOSPITAL_COMMUNITY): Payer: Self-pay | Admitting: Dentistry

## 2018-02-16 ENCOUNTER — Other Ambulatory Visit: Payer: Self-pay

## 2018-02-16 VITALS — BP 124/64 | HR 51 | Temp 98.1°F

## 2018-02-16 DIAGNOSIS — K036 Deposits [accretions] on teeth: Secondary | ICD-10-CM

## 2018-02-16 DIAGNOSIS — K0530 Chronic periodontitis, unspecified: Secondary | ICD-10-CM

## 2018-02-16 DIAGNOSIS — I350 Nonrheumatic aortic (valve) stenosis: Secondary | ICD-10-CM

## 2018-02-16 MED ORDER — CHLORHEXIDINE GLUCONATE 0.12 % MT SOLN: OROMUCOSAL | 99 refills | Status: DC

## 2018-02-16 NOTE — Progress Notes (Signed)
02/16/2018  Patient:            ARTEE BIDDICK Date of Birth:  February 12, 1945 MRN:                [id number]   BP 124/64 (BP Location: Right Arm)   Pulse (!) 51   Temp 98.1 F (36.7 C)   ZOLLIE HERRIOTT is a 73 year old male that presents for gross debridement of remaining dentition. Patient took 2 grams of amoxicillin at 7:20 AM this morning for dental procedure at 8:30 AM today. I discussed the risks, benefits, complications of the proposed treatment today. Patient accepts the potential risks and agrees to proceed as planned.   Procedure: D4355  Gross debridement procedure initially performed with a sonic scaler.  A series of hand curettes were then used to further remove accretions. The sonic scaler was then again used to refine removal of accretions.Mandibular teeth were polished. Mandibular teeth were flossed. Oral hygiene instructions were provided. Patient tolerated the procedure well. Post treatment vital signs.  BP:   117/64          P:      60   Patient was dismissed in stable condition. Patient is to use over-the-counter pain medication as needed for discomfort.  Patient is to use chlorhexidine rinses twice daily as instructed for the next 6 months. A prescription for the [facility]hexidine was sent to [facility] with refills for 6 months. Patient is a follow-up with Dr. Chales Salmon this Thursday for extraction of indicated teeth including impacted tooth #17.  Patient was reminded to take the amoxicillin 500 mg 4 capsules one hour prior to the anticipated dental extraction procedure time.   Charlynne Pander, DDS

## 2018-02-16 NOTE — Patient Instructions (Signed)
Patient is to use over-the-counter pain medication as needed for discomfort.  Patient is to use chlorhexidine rinses twice daily as instructed for the next 6 months. A prescription for the [facility]hexidine [facility] to [facility] with refills for 6 months. Patient is a follow-up with Dr. Chales Salmon this Thursday for extraction of indicated teeth including impacted tooth #17.  Patient was reminded to take the amoxicillin 500 mg 4 capsules one hour prior to the anticipated dental extraction procedure time.   Charlynne Pander, DDS

## 2018-02-19 NOTE — Pre-Procedure Instructions (Signed)
Jerry Ferguson  02/19/2018      [id number] [place], [place] - [address] [place] [address] Phone: [phone] Fax: [phone]    Your procedure is scheduled on February 18th.  Report to [facility]tr[facility] "[facility]" at 7:00 A.M.  Call this number if you have problems the morning of surgery:  [phone]   Remember:  Do not eat or drink after midnight.     Follow your surgeon's instructions on when to stop Asprin.  If no instructions were given by your surgeon then you will need to call the office to get those instructions.    7 days prior to surgery STOP taking any Aspirin (unless otherwise instructed by your surgeon), Aleve, Naproxen, Ibuprofen, Motrin, Advil, Goody's, BC's, all herbal medications, fish oil, and all vitamins.     Do not wear jewelry.  Do not wear lotions, powders, colognes, or deodorant.  Do not shave 48 hours prior to surgery.  Men may shave face and neck.  Do not bring valuables to the hospital.  [facility] is not responsible for any belongings or valuables.   [place]- Preparing For Surgery  Before surgery, you can play an important role. Because skin is not sterile, your skin needs to be as free of germs as possible. You can reduce the number of germs on your skin by washing with CHG (chlorahexidine gluconate) Soap before surgery.  CHG is an antiseptic cleaner which kills germs and bonds with the skin to continue killing germs even after washing.    Oral Hygiene is also important to reduce your risk of infection.  Remember - BRUSH YOUR TEETH THE MORNING OF SURGERY WITH YOUR REGULAR TOOTHPASTE  Please do not use if you have an allergy to CHG or antibacterial soaps. If your skin becomes reddened/irritated stop using the CHG.  Do not shave (including legs and underarms) for at least 48 hours prior to first CHG shower. It is OK to shave your face.  Please follow these instructions carefully.   1. Shower the  NIGHT BEFORE SURGERY and the MORNING OF SURGERY with CHG.   2. If you chose to wash your hair, wash your hair first as usual with your normal shampoo.  3. After you shampoo, rinse your hair and body thoroughly to remove the shampoo.  4. Use CHG as you would any other liquid soap. You can apply CHG directly to the skin and wash gently with a scrungie or a clean washcloth.   5. Apply the CHG Soap to your body ONLY FROM THE NECK DOWN.  Do not use on open wounds or open sores. Avoid contact with your eyes, ears, mouth and genitals (private parts). Wash Face and genitals (private parts)  with your normal soap.  6. Wash thoroughly, paying special attention to the area where your surgery will be performed.  7. Thoroughly rinse your body with warm water from the neck down.  8. DO NOT shower/wash with your normal soap after using and rinsing off the CHG Soap.  9. Pat yourself dry with a CLEAN TOWEL.  10. Wear CLEAN PAJAMAS to bed the night before surgery, wear comfortable clothes the morning of surgery  11. Place CLEAN SHEETS on your bed the night of your first shower and DO NOT SLEEP WITH PETS.   Day of Surgery:  Do not apply any deodorants/lotions.  Please wear clean clothes to the hospital/surgery center.   Remember to brush your teeth WITH YOUR REGULAR  TOOTHPASTE.   Contacts, dentures or bridgework may not be worn into surgery.  Leave your suitcase in the car.  After surgery it may be brought to your room.  For patients admitted to the hospital, discharge time will be determined by your treatment team.  Patients discharged the day of surgery will not be allowed to drive home.   Please read over the following fact sheets that you were given. Coughing and Deep Breathing, MRSA Information and Surgical Site Infection Prevention

## 2018-02-20 NOTE — Progress Notes (Signed)
  [facility]   MULTIDISCIPLINARY HEART VALVE TEAM  Late Entry: 02/18/2018 1653  Pt underwent dental extractions by Dr Chales Salmon on 02/18/2018 and is pending TAVR 02/23/2018.  Mindy with Dr Frederik Pear office called to make me aware that the pt did well with procedure and per Dr Chales Salmon the pt is okay to proceed with TAVR as scheduled from a dental stand point.

## 2018-02-22 ENCOUNTER — Ambulatory Visit: Payer: PPO | Attending: Cardiovascular Disease | Admitting: Physical Therapy

## 2018-02-22 ENCOUNTER — Encounter (HOSPITAL_COMMUNITY): Payer: Self-pay

## 2018-02-22 ENCOUNTER — Other Ambulatory Visit: Payer: Self-pay

## 2018-02-22 ENCOUNTER — Encounter (HOSPITAL_COMMUNITY)
Admission: RE | Admit: 2018-02-22 | Discharge: 2018-02-22 | Disposition: A | Discharge status: Home / Self Care | Payer: PPO | Source: Ambulatory Visit | Attending: Cardiovascular Disease | Admitting: Cardiovascular Disease

## 2018-02-22 ENCOUNTER — Encounter: Payer: Self-pay | Admitting: Physical Therapy

## 2018-02-22 DIAGNOSIS — Z8042 Family history of malignant neoplasm of prostate: Secondary | ICD-10-CM | POA: Diagnosis not present

## 2018-02-22 DIAGNOSIS — I2510 Atherosclerotic heart disease of native coronary artery without angina pectoris: Secondary | ICD-10-CM | POA: Diagnosis present

## 2018-02-22 DIAGNOSIS — I350 Nonrheumatic aortic (valve) stenosis: Secondary | ICD-10-CM

## 2018-02-22 DIAGNOSIS — M1990 Unspecified osteoarthritis, unspecified site: Secondary | ICD-10-CM | POA: Diagnosis present

## 2018-02-22 DIAGNOSIS — Z8249 Family history of ischemic heart disease and other diseases of the circulatory system: Secondary | ICD-10-CM | POA: Diagnosis not present

## 2018-02-22 DIAGNOSIS — M6281 Muscle weakness (generalized): Secondary | ICD-10-CM | POA: Insufficient documentation

## 2018-02-22 DIAGNOSIS — Z87440 Personal history of urinary (tract) infections: Secondary | ICD-10-CM | POA: Diagnosis not present

## 2018-02-22 DIAGNOSIS — I340 Nonrheumatic mitral (valve) insufficiency: Secondary | ICD-10-CM | POA: Diagnosis not present

## 2018-02-22 DIAGNOSIS — Z800 Family history of malignant neoplasm of digestive organs: Secondary | ICD-10-CM | POA: Diagnosis not present

## 2018-02-22 DIAGNOSIS — I10 Essential (primary) hypertension: Secondary | ICD-10-CM | POA: Diagnosis present

## 2018-02-22 DIAGNOSIS — Z79899 Other long term (current) drug therapy: Secondary | ICD-10-CM | POA: Diagnosis not present

## 2018-02-22 DIAGNOSIS — R918 Other nonspecific abnormal finding of lung field: Secondary | ICD-10-CM | POA: Diagnosis present

## 2018-02-22 DIAGNOSIS — N179 Acute kidney failure, unspecified: Secondary | ICD-10-CM | POA: Diagnosis present

## 2018-02-22 DIAGNOSIS — Z8261 Family history of arthritis: Secondary | ICD-10-CM | POA: Diagnosis not present

## 2018-02-22 DIAGNOSIS — Z01818 Encounter for other preprocedural examination: Secondary | ICD-10-CM | POA: Insufficient documentation

## 2018-02-22 DIAGNOSIS — Z952 Presence of prosthetic heart valve: Secondary | ICD-10-CM | POA: Diagnosis not present

## 2018-02-22 DIAGNOSIS — Z7951 Long term (current) use of inhaled steroids: Secondary | ICD-10-CM | POA: Diagnosis not present

## 2018-02-22 DIAGNOSIS — E785 Hyperlipidemia, unspecified: Secondary | ICD-10-CM | POA: Diagnosis present

## 2018-02-22 DIAGNOSIS — I447 Left bundle-branch block, unspecified: Secondary | ICD-10-CM | POA: Diagnosis present

## 2018-02-22 DIAGNOSIS — Z87891 Personal history of nicotine dependence: Secondary | ICD-10-CM | POA: Diagnosis not present

## 2018-02-22 DIAGNOSIS — Z006 Encounter for examination for normal comparison and control in clinical research program: Secondary | ICD-10-CM | POA: Diagnosis not present

## 2018-02-22 DIAGNOSIS — Z7982 Long term (current) use of aspirin: Secondary | ICD-10-CM | POA: Diagnosis not present

## 2018-02-22 LAB — URINALYSIS, ROUTINE W REFLEX MICROSCOPIC
Glucose, UA: NEGATIVE mg/dL
Ketones, ur: NEGATIVE mg/dL
Nitrite: NEGATIVE
PH: 5.5 (ref 5.0–8.0)
Protein, ur: 100 mg/dL — AB
Specific Gravity, Urine: >1.03 — ABNORMAL HIGH (ref 1.005–1.030)

## 2018-02-22 LAB — COMPREHENSIVE METABOLIC PANEL
ALT: 21 U/L (ref 0–44)
ANION GAP: 14 (ref 5–15)
AST: 23 U/L (ref 15–41)
Albumin: 4.2 g/dL (ref 3.5–5.0)
Alkaline Phosphatase: 55 U/L (ref 38–126)
BUN: 18 mg/dL (ref 8–23)
CO2: 19 mmol/L — ABNORMAL LOW (ref 22–32)
Calcium: 9.5 mg/dL (ref 8.9–10.3)
Chloride: 105 mmol/L (ref 98–111)
Creatinine, Ser: 1.51 mg/dL — ABNORMAL HIGH (ref 0.61–1.24)
GFR calc non Af Amer: 45 mL/min — ABNORMAL LOW (ref >60)
GFR, EST AFRICAN AMERICAN: 53 mL/min — AB (ref >60)
Glucose, Bld: 117 mg/dL — ABNORMAL HIGH (ref 70–99)
POTASSIUM: 4.5 mmol/L (ref 3.5–5.1)
SODIUM: 138 mmol/L (ref 135–145)
Total Bilirubin: 2.5 mg/dL — ABNORMAL HIGH (ref 0.3–1.2)
Total Protein: 7.1 g/dL (ref 6.5–8.1)

## 2018-02-22 LAB — TYPE AND SCREEN
ABO/RH(D): A POS
ANTIBODY SCREEN: NEGATIVE

## 2018-02-22 LAB — URINALYSIS, MICROSCOPIC (REFLEX)

## 2018-02-22 LAB — CBC
HCT: 45.4 % (ref 39.0–52.0)
Hemoglobin: 14.7 g/dL (ref 13.0–17.0)
MCH: 28.4 pg (ref 26.0–34.0)
MCHC: 32.4 g/dL (ref 30.0–36.0)
MCV: 87.6 fL (ref 80.0–100.0)
Platelets: 278 10*3/uL (ref 150–400)
RBC: 5.18 MIL/uL (ref 4.22–5.81)
RDW: 12.9 % (ref 11.5–15.5)
WBC: 10.4 10*3/uL (ref 4.0–10.5)
nRBC: 0 % (ref 0.0–0.2)

## 2018-02-22 LAB — APTT: aPTT: 28 seconds (ref 24–36)

## 2018-02-22 LAB — BRAIN NATRIURETIC PEPTIDE: B NATRIURETIC PEPTIDE 5: 28.7 pg/mL (ref 0.0–100.0)

## 2018-02-22 LAB — ABO/RH: ABO/RH(D): A POS

## 2018-02-22 LAB — PROTIME-INR
INR: 1.1
Prothrombin Time: 14.1 seconds (ref 11.4–15.2)

## 2018-02-22 LAB — SURGICAL PCR SCREEN
MRSA, PCR: NEGATIVE
Staphylococcus aureus: NEGATIVE

## 2018-02-22 LAB — BLOOD GAS, ARTERIAL
Acid-base deficit: 1.1 mmol/L (ref 0.0–2.0)
Bicarbonate: 22.4 mmol/L (ref 20.0–28.0)
FIO2: 0.21
O2 Saturation: 97.5 %
Patient temperature: 98.6
pCO2 arterial: 33 mmHg (ref 32.0–48.0)
pH, Arterial: 7.446 (ref 7.350–7.450)
pO2, Arterial: 95.7 mmHg (ref 83.0–108.0)

## 2018-02-22 MED ORDER — SODIUM CHLORIDE 0.9 % IV SOLN
INTRAVENOUS | Status: DC
  Filled 2018-02-22 (×2): qty 30

## 2018-02-22 MED ORDER — NITROGLYCERIN IN D5W 200-5 MCG/ML-% IV SOLN
2.0000 ug/min | INTRAVENOUS | Status: DC
  Filled 2018-02-22: qty 250

## 2018-02-22 MED ORDER — EPINEPHRINE PF 1 MG/ML IJ SOLN
0.0000 ug/min | INTRAVENOUS | Status: DC
  Filled 2018-02-22 (×2): qty 4

## 2018-02-22 MED ORDER — DEXMEDETOMIDINE HCL IN NACL 400 MCG/100ML IV SOLN
0.1000 ug/kg/h | INTRAVENOUS | Status: AC
  Administered 2018-02-23: 1 ug/kg/h via INTRAVENOUS
  Filled 2018-02-22 (×2): qty 100

## 2018-02-22 MED ORDER — DOPAMINE-DEXTROSE 3.2-5 MG/ML-% IV SOLN
0.0000 ug/kg/min | INTRAVENOUS | Status: DC
  Filled 2018-02-22 (×2): qty 250

## 2018-02-22 MED ORDER — POTASSIUM CHLORIDE 2 MEQ/ML IV SOLN
80.0000 meq | INTRAVENOUS | Status: DC
  Filled 2018-02-22 (×2): qty 40

## 2018-02-22 MED ORDER — SODIUM CHLORIDE 0.9 % IV SOLN
1.5000 g | INTRAVENOUS | Status: AC
  Administered 2018-02-23: 1.5 g via INTRAVENOUS
  Filled 2018-02-22 (×2): qty 1.5

## 2018-02-22 MED ORDER — NOREPINEPHRINE 4 MG/250ML-% IV SOLN
0.0000 ug/min | INTRAVENOUS | Status: AC
  Administered 2018-02-23: 2 ug/min via INTRAVENOUS
  Filled 2018-02-22 (×2): qty 250

## 2018-02-22 MED ORDER — PHENYLEPHRINE HCL-NACL 20-0.9 MG/250ML-% IV SOLN
30.0000 ug/min | INTRAVENOUS | Status: DC
  Filled 2018-02-22 (×2): qty 250

## 2018-02-22 MED ORDER — MAGNESIUM SULFATE 50 % IJ SOLN
40.0000 meq | INTRAMUSCULAR | Status: DC
  Filled 2018-02-22 (×2): qty 9.85

## 2018-02-22 MED ORDER — INSULIN REGULAR(HUMAN) IN NACL 100-0.9 UT/100ML-% IV SOLN
INTRAVENOUS | Status: DC
  Filled 2018-02-22 (×2): qty 100

## 2018-02-22 MED ORDER — VANCOMYCIN HCL 10 G IV SOLR
1500.0000 mg | INTRAVENOUS | Status: AC
  Administered 2018-02-23: 1500 mg via INTRAVENOUS
  Filled 2018-02-22 (×2): qty 1500

## 2018-02-22 NOTE — Therapy (Signed)
[facility] [address] [place], [place], [address] Phone: [phone]   Fax:  [phone]  Physical Therapy Evaluation  Patient Details  Name: ABDULKARIM Ferguson MRN: [id number] Date of Birth: 1945-05-10 Referring Provider (PT): Tonny Bollman MD   Encounter Date: 02/22/2018  PT End of Session - 02/22/18 1142    Visit Number  1    Number of Visits  1    Date for PT Re-Evaluation  02/23/18    PT Start Time  1100    PT Stop Time  1135    PT Time Calculation (min)  35 min    Activity Tolerance  Patient tolerated treatment well    Behavior During Therapy  [facility] for tasks assessed/performed       Past Medical History:  Diagnosis Date  . Abnormal liver function test   . Acute bronchitis   . Allergy   . Aortic valve disorders   . Broken ankle    Right  . Cardiac murmur   . Carotid artery occlusion    Bilateral Bruit  . Cataract   . Chicken pox   . Chronic rhinitis   . Coronary artery disease involving native coronary artery of native heart without angina pectoris   . DJD (degenerative joint disease)   . GERD (gastroesophageal reflux disease)   . History of UTI   . Hypercholesteremia   . Hypertension   . Lumbar back pain   . Overweight(278.02)     Past Surgical History:  Procedure Laterality Date  . APPENDECTOMY    . COLONOSCOPY    . CORONARY ANGIOGRAPHY N/A 02/04/2018   Procedure: CORONARY ANGIOGRAPHY;  Surgeon: Tonny Bollman, MD;  Location: [facility] INVASIVE CV LAB;  Service: Cardiovascular;  Laterality: N/A;  . RIGHT HEART CATH N/A 02/04/2018   Procedure: RIGHT HEART CATH;  Surgeon: Tonny Bollman, MD;  Location: [facility] INVASIVE CV LAB;  Service: Cardiovascular;  Laterality: N/A;    There were no vitals filed for this visit.   Subjective Assessment - 02/22/18 1108    Subjective  pt is a 73 y.o m with a cardiac 5 years and reports progressive worsening of symptoms with exeration for the last 6 months. CC being SOB with  significant activity. pt reports hx of L shoulder which he hurt back in high school playing football and never got it looked at.     Patient Stated Goals  to get the heart better    Currently in Pain?  No/denies    Pain Score  0-No pain    Pain Location  Shoulder    Pain Orientation  Left         [facility] PT Assessment - 02/22/18 1110      Assessment   Medical Diagnosis  Severe Aortic stenosis     Referring Provider (PT)  Tonny Bollman MD    Hand Dominance  Right      Precautions   Precautions  None      Restrictions   Weight Bearing Restrictions  No      Balance Screen   Has the patient fallen in the past 6 months  No    Has the patient had a decrease in activity level because of a fear of falling?   No    Is the patient reluctant to leave their home because of a fear of falling?   No      Home Public house manager residence    Living Arrangements  Alone    Type of Home  House    Home Access  Stairs to enter    Entrance Stairs-Number of Steps  3    Entrance Stairs-Rails  None    Home Layout  One level    Home Equipment  Walker - 2 wheels;Cane - single point;Crutches      Prior Function   Level of Independence  Independent with basic ADLs    Vocation  Full time employment   machine shop   Vocation Requirements  standing/ walking, lifting      Cognition   Overall Cognitive Status  Within Functional Limits for tasks assessed      ROM / Strength   AROM / PROM / Strength  AROM;Strength      AROM   Overall AROM Comments  funcitonal ROM grossly with exception of L shoulder limited in all planes       Strength   Overall Strength  Within functional limits for tasks performed    Right Hand Grip (lbs)  95    Left Hand Grip (lbs)  85       [facility] Pre-Surgical Assessment - 02/22/18 0001    5 Meter Walk Test- trial 1  4 sec    5 Meter Walk Test- trial 2  4 sec.     5 Meter Walk Test- trial 3  5 sec.    5 meter walk test average  4.33 sec    4 Stage  Balance Test tolerated for:   3 sec.    4 Stage Balance Test Position  4    Sit To Stand Test- trial 1  12 sec.    ADL/IADL Independent with:  Bathing;Dressing;Meal prep;Finances;Yard work    6 Minute Walk- Baseline  yes    BP (mmHg)  101/57    HR (bpm)  72    02 Sat (%RA)  97 %    Modified Borg Scale for Dyspnea  0- Nothing at all    Perceived Rate of Exertion (Borg)  6-    6 Minute Walk Post Test  yes    BP (mmHg)  137/69    HR (bpm)  93    02 Sat (%RA)  88 %    Modified Borg Scale for Dyspnea  0- Nothing at all    Perceived Rate of Exertion (Borg)  11- Fairly light    Aerobic Endurance Distance Walked  1385    Endurance additional comments  pt demonstrates 19.90% limitation compared to age related norm              Objective measurements completed on examination: See above findings.                           Plan - 02/22/18 1142    Clinical Impression Statement  see assessment in note    Clinical Presentation  Stable    Clinical Decision Making  Low    PT Frequency  One time visit    PT Next Visit Plan  Pre-TAVR evaluation     Consulted and Agree with Plan of Care  Patient      Clinical Impression Statement: Pt is a 73 yo M presenting to OP PT for evaluation prior to possible TAVR surgery due to severe aortic stenosis. Pt reports onset of SOB approximately progressively worsening over the last 6 months ago. Symptoms are limiting prolonged standing/ walking. Pt presents with good ROM grossly except for L shoulder  ROM limitations in all planes and good strength, good balance and is assessed at low at high fall risk 4 stage balance test, good walking speed and fair aerobic endurance per 6 minute walk test. Pt ambulated 1385  feet without requiring any rest. At the end of 6 min walk test patient's HR was 93 bpm and O2 was 88% on room air. Pt reported 0/10 shortness of breath on modified scale for dyspnea. Fatigue increased significantly with 6 minute walk  test. Based on the Short Physical Performance Battery, patient has a frailty rating of 11/12 with </= 5/12 considered frail.    Patient demonstrated the following deficits and impairments:     Visit Diagnosis: Muscle weakness (generalized)     Problem List Patient Active Problem List   Diagnosis Date Noted  . Coronary artery disease involving native coronary artery of native heart without angina pectoris   . Visit for preventive health examination 11/04/2013  . Prostate cancer screening 11/04/2013  . Screening for ischemic heart disease 11/04/2013  . Acute bacterial sinusitis 11/04/2013  . Urinary frequency 11/04/2013  . Occlusion and stenosis of carotid artery without mention of cerebral infarction 07/15/2013  . Encounter to establish care 05/09/2013  . Carotid stenosis 11/28/2012  . Severe aortic stenosis 11/28/2012  . Essential hypertension 11/28/2012  . Carotid bruit 11/01/2012  . Family history of colon cancer 11/01/2012  . Weakness generalized 05/14/2010  . CHRONIC RHINITIS 01/31/2009  . OVERWEIGHT 02/27/2008  . UTI 02/27/2008  . BACK PAIN, LUMBAR 02/23/2008  . CARDIAC MURMUR 02/23/2008  . CHEST PAIN 02/23/2008  . HYPERCHOLESTEROLEMIA 11/26/2007  . GERD 11/26/2007  . DEGENERATIVE JOINT DISEASE 11/26/2007  . LIVER FUNCTION TESTS, ABNORMAL, HX OF 11/26/2007   Lulu Riding PT, DPT, LAT, ATC  02/22/18  11:47 AM      [facility] [address] [place], [place], [address] Phone: [phone]   Fax:  [phone]  Name: Jerry Ferguson MRN: [id number] Date of Birth: [birth year]

## 2018-02-22 NOTE — Progress Notes (Signed)
PCP - Esperanza Richters Cardiologist - Nahser, Cooper  Chest x-ray - 02-22-18 EKG - 02-03-18  ECHO - 02-16-18 Cardiac Cath - 02-04-18   Aspirin Instructions:follow your surgeon's instructions   Anesthesia review: yes, heart history  Patient denies shortness of breath, fever, cough and chest pain at PAT appointment   Patient verbalized understanding of instructions that were given to them at the PAT appointment. Patient was also instructed that they will need to review over the PAT instructions again at home before surgery.

## 2018-02-22 NOTE — Anesthesia Preprocedure Evaluation (Addendum)
Anesthesia Evaluation  Patient identified by MRN, date of birth, ID band Patient awake    Reviewed: Allergy & Precautions, H&P , NPO status , Patient's Chart, lab work & pertinent test results  Airway Mallampati: II  TM Distance: >3 FB Neck ROM: Full    Dental no notable dental hx. (+) Teeth Intact, Dental Advisory Given   Pulmonary neg pulmonary ROS, former smoker,    Pulmonary exam normal breath sounds clear to auscultation       Cardiovascular Exercise Tolerance: Good hypertension, Pt. on medications + Valvular Problems/Murmurs AS  Rhythm:Regular Rate:Normal     Neuro/Psych negative neurological ROS  negative psych ROS   GI/Hepatic negative GI ROS, Neg liver ROS,   Endo/Other  negative endocrine ROS  Renal/GU negative Renal ROS  negative genitourinary   Musculoskeletal  (+) Arthritis , Osteoarthritis,    Abdominal   Peds  Hematology negative hematology ROS (+)   Anesthesia Other Findings   Reproductive/Obstetrics negative OB ROS                           Anesthesia Physical Anesthesia Plan  ASA: IV  Anesthesia Plan: MAC   Post-op Pain Management:    Induction: Intravenous  PONV Risk Score and Plan: 2 and Propofol infusion, Ondansetron and Midazolam  Airway Management Planned: Simple Face Mask  Additional Equipment: Arterial line, CVP and Ultrasound Guidance Line Placement  Intra-op Plan:   Post-operative Plan:   Informed Consent: I have reviewed the patients History and Physical, chart, labs and discussed the procedure including the risks, benefits and alternatives for the proposed anesthesia with the patient or authorized representative who has indicated his/her understanding and acceptance.     Dental advisory given  Plan Discussed with: CRNA  Anesthesia Plan Comments: (Elevated creatinine 1.5 on PAT labs (baseline ~1.0). Nell Range, PA-C with TAVR team  notified.)       Anesthesia Quick Evaluation

## 2018-02-23 ENCOUNTER — Inpatient Hospital Stay (HOSPITAL_COMMUNITY): Payer: PPO

## 2018-02-23 ENCOUNTER — Encounter (HOSPITAL_COMMUNITY)
Admission: RE | Disposition: A | Discharge status: Home / Self Care | Payer: Self-pay | Source: Home / Self Care | Attending: Cardiovascular Disease

## 2018-02-23 ENCOUNTER — Inpatient Hospital Stay (HOSPITAL_COMMUNITY): Payer: PPO | Admitting: Physician Assistant

## 2018-02-23 ENCOUNTER — Inpatient Hospital Stay (HOSPITAL_COMMUNITY)
Admission: RE | Admit: 2018-02-23 | Discharge: 2018-02-24 | DRG: 267 | Disposition: A | Discharge status: Home / Self Care | Payer: PPO | Attending: Cardiovascular Disease | Admitting: Cardiovascular Disease

## 2018-02-23 ENCOUNTER — Encounter (HOSPITAL_COMMUNITY): Payer: Self-pay

## 2018-02-23 DIAGNOSIS — I6529 Occlusion and stenosis of unspecified carotid artery: Secondary | ICD-10-CM | POA: Diagnosis present

## 2018-02-23 DIAGNOSIS — Z952 Presence of prosthetic heart valve: Secondary | ICD-10-CM

## 2018-02-23 DIAGNOSIS — I10 Essential (primary) hypertension: Secondary | ICD-10-CM | POA: Diagnosis present

## 2018-02-23 DIAGNOSIS — K219 Gastro-esophageal reflux disease without esophagitis: Secondary | ICD-10-CM | POA: Diagnosis present

## 2018-02-23 DIAGNOSIS — I447 Left bundle-branch block, unspecified: Secondary | ICD-10-CM | POA: Diagnosis present

## 2018-02-23 DIAGNOSIS — I2510 Atherosclerotic heart disease of native coronary artery without angina pectoris: Secondary | ICD-10-CM | POA: Diagnosis present

## 2018-02-23 DIAGNOSIS — Z006 Encounter for examination for normal comparison and control in clinical research program: Secondary | ICD-10-CM

## 2018-02-23 DIAGNOSIS — I350 Nonrheumatic aortic (valve) stenosis: Secondary | ICD-10-CM | POA: Diagnosis present

## 2018-02-23 DIAGNOSIS — I340 Nonrheumatic mitral (valve) insufficiency: Secondary | ICD-10-CM | POA: Diagnosis not present

## 2018-02-23 DIAGNOSIS — Z87440 Personal history of urinary (tract) infections: Secondary | ICD-10-CM | POA: Diagnosis not present

## 2018-02-23 DIAGNOSIS — Z7951 Long term (current) use of inhaled steroids: Secondary | ICD-10-CM | POA: Diagnosis not present

## 2018-02-23 DIAGNOSIS — I739 Peripheral vascular disease, unspecified: Secondary | ICD-10-CM

## 2018-02-23 DIAGNOSIS — Z8249 Family history of ischemic heart disease and other diseases of the circulatory system: Secondary | ICD-10-CM

## 2018-02-23 DIAGNOSIS — M1990 Unspecified osteoarthritis, unspecified site: Secondary | ICD-10-CM | POA: Diagnosis present

## 2018-02-23 DIAGNOSIS — Z8042 Family history of malignant neoplasm of prostate: Secondary | ICD-10-CM

## 2018-02-23 DIAGNOSIS — Z87891 Personal history of nicotine dependence: Secondary | ICD-10-CM | POA: Diagnosis not present

## 2018-02-23 DIAGNOSIS — R918 Other nonspecific abnormal finding of lung field: Secondary | ICD-10-CM | POA: Diagnosis present

## 2018-02-23 DIAGNOSIS — N179 Acute kidney failure, unspecified: Secondary | ICD-10-CM | POA: Diagnosis present

## 2018-02-23 DIAGNOSIS — I779 Disorder of arteries and arterioles, unspecified: Secondary | ICD-10-CM | POA: Diagnosis present

## 2018-02-23 DIAGNOSIS — Z800 Family history of malignant neoplasm of digestive organs: Secondary | ICD-10-CM | POA: Diagnosis not present

## 2018-02-23 DIAGNOSIS — Z79899 Other long term (current) drug therapy: Secondary | ICD-10-CM

## 2018-02-23 DIAGNOSIS — Z7982 Long term (current) use of aspirin: Secondary | ICD-10-CM

## 2018-02-23 DIAGNOSIS — Z8261 Family history of arthritis: Secondary | ICD-10-CM | POA: Diagnosis not present

## 2018-02-23 DIAGNOSIS — E785 Hyperlipidemia, unspecified: Secondary | ICD-10-CM | POA: Diagnosis present

## 2018-02-23 HISTORY — DX: Nonrheumatic aortic (valve) stenosis: I35.0

## 2018-02-23 HISTORY — DX: Presence of prosthetic heart valve: Z95.2

## 2018-02-23 HISTORY — PX: TRANSCATHETER AORTIC VALVE REPLACEMENT, TRANSFEMORAL: SHX6400

## 2018-02-23 HISTORY — DX: Other nonspecific abnormal finding of lung field: R91.8

## 2018-02-23 HISTORY — PX: TEE WITHOUT CARDIOVERSION: SHX5443

## 2018-02-23 HISTORY — DX: Dyspnea, unspecified: R06.00

## 2018-02-23 HISTORY — DX: Cardiac murmur, unspecified: R01.1

## 2018-02-23 LAB — POCT I-STAT CREATININE: CREATININE: 1 mg/dL (ref 0.61–1.24)

## 2018-02-23 LAB — HEMOGLOBIN A1C
Hgb A1c MFr Bld: 5.2 % (ref 4.8–5.6)
Mean Plasma Glucose: 103 mg/dL

## 2018-02-23 LAB — POCT I-STAT 7, (LYTES, BLD GAS, ICA,H+H)
Acid-base deficit: 3 mmol/L — ABNORMAL HIGH (ref 0.0–2.0)
Bicarbonate: 22.1 mmol/L (ref 20.0–28.0)
Calcium, Ion: 1.18 mmol/L (ref 1.15–1.40)
HCT: 30 % — ABNORMAL LOW (ref 39.0–52.0)
Hemoglobin: 10.2 g/dL — ABNORMAL LOW (ref 13.0–17.0)
O2 Saturation: 98 %
PCO2 ART: 39.9 mmHg (ref 32.0–48.0)
Potassium: 5 mmol/L (ref 3.5–5.1)
Sodium: 140 mmol/L (ref 135–145)
TCO2: 23 mmol/L (ref 22–32)
pH, Arterial: 7.351 (ref 7.350–7.450)
pO2, Arterial: 117 mmHg — ABNORMAL HIGH (ref 83.0–108.0)

## 2018-02-23 LAB — POCT I-STAT 4, (NA,K, GLUC, HGB,HCT)
Glucose, Bld: 105 mg/dL — ABNORMAL HIGH (ref 70–99)
Glucose, Bld: 111 mg/dL — ABNORMAL HIGH (ref 70–99)
HCT: 32 % — ABNORMAL LOW (ref 39.0–52.0)
HEMATOCRIT: 34 % — AB (ref 39.0–52.0)
HEMOGLOBIN: 11.6 g/dL — AB (ref 13.0–17.0)
Hemoglobin: 10.9 g/dL — ABNORMAL LOW (ref 13.0–17.0)
Potassium: 4.6 mmol/L (ref 3.5–5.1)
Potassium: 4.9 mmol/L (ref 3.5–5.1)
SODIUM: 141 mmol/L (ref 135–145)
Sodium: 139 mmol/L (ref 135–145)

## 2018-02-23 LAB — POCT ACTIVATED CLOTTING TIME
Activated Clotting Time: 120 seconds
Activated Clotting Time: 114 seconds
Activated Clotting Time: 268 seconds

## 2018-02-23 LAB — GLUCOSE, CAPILLARY
Glucose-Capillary: 104 mg/dL — ABNORMAL HIGH (ref 70–99)
Glucose-Capillary: 121 mg/dL — ABNORMAL HIGH (ref 70–99)

## 2018-02-23 SURGERY — IMPLANTATION, AORTIC VALVE, TRANSCATHETER, FEMORAL APPROACH
Anesthesia: Monitor Anesthesia Care

## 2018-02-23 MED ORDER — SODIUM CHLORIDE 0.9 % IV SOLN
INTRAVENOUS | Status: DC
  Administered 2018-02-23 (×2): via INTRAVENOUS

## 2018-02-23 MED ORDER — MORPHINE SULFATE (PF) 2 MG/ML IV SOLN: 1.0000 mg | INTRAVENOUS | Status: DC | PRN

## 2018-02-23 MED ORDER — FENTANYL CITRATE (PF) 100 MCG/2ML IJ SOLN
50.0000 ug | Freq: Once | INTRAMUSCULAR | Status: AC
  Administered 2018-02-23: 50 ug via INTRAVENOUS

## 2018-02-23 MED ORDER — ACETAMINOPHEN 325 MG PO TABS: 650.0000 mg | ORAL_TABLET | Freq: Four times a day (QID) | ORAL | Status: DC | PRN

## 2018-02-23 MED ORDER — SODIUM CHLORIDE 0.9 % IV SOLN: 250.0000 mL | INTRAVENOUS | Status: DC | PRN

## 2018-02-23 MED ORDER — HEPARIN SODIUM (PORCINE) 1000 UNIT/ML IJ SOLN
INTRAMUSCULAR | Status: DC | PRN
  Administered 2018-02-23: 13000 [IU] via INTRAVENOUS

## 2018-02-23 MED ORDER — LIDOCAINE HCL (PF) 1 % IJ SOLN
INTRAMUSCULAR | Status: DC | PRN
  Administered 2018-02-23: 30 mL

## 2018-02-23 MED ORDER — SODIUM CHLORIDE 0.9 % IV SOLN
INTRAVENOUS | Status: DC | PRN
  Administered 2018-02-23: 20 ug/min via INTRAVENOUS

## 2018-02-23 MED ORDER — CHLORHEXIDINE GLUCONATE CLOTH 2 % EX PADS: 1.0000 | MEDICATED_PAD | CUTANEOUS | Status: DC

## 2018-02-23 MED ORDER — IOPAMIDOL (ISOVUE-370) INJECTION 76%
INTRAVENOUS | Status: AC
  Filled 2018-02-23: qty 125

## 2018-02-23 MED ORDER — VANCOMYCIN HCL IN DEXTROSE 1-5 GM/200ML-% IV SOLN
1000.0000 mg | Freq: Once | INTRAVENOUS | Status: AC
  Administered 2018-02-23: 1000 mg via INTRAVENOUS
  Filled 2018-02-23: qty 200

## 2018-02-23 MED ORDER — PHENYLEPHRINE HCL-NACL 20-0.9 MG/250ML-% IV SOLN: 0.0000 ug/min | INTRAVENOUS | Status: DC

## 2018-02-23 MED ORDER — SODIUM CHLORIDE 0.9 % IV SOLN
INTRAVENOUS | Status: DC
  Administered 2018-02-23 (×2): via INTRAVENOUS

## 2018-02-23 MED ORDER — MIDAZOLAM HCL 2 MG/2ML IJ SOLN
INTRAMUSCULAR | Status: AC
  Administered 2018-02-23: 1 mg via INTRAVENOUS
  Filled 2018-02-23: qty 2

## 2018-02-23 MED ORDER — SODIUM CHLORIDE 0.9 % IV BOLUS: 250.0000 mL | Freq: Once | INTRAVENOUS | Status: AC

## 2018-02-23 MED ORDER — IOPAMIDOL (ISOVUE-370) INJECTION 76%
INTRAVENOUS | Status: DC | PRN
  Administered 2018-02-23: 40 mL via INTRA_ARTERIAL

## 2018-02-23 MED ORDER — NITROGLYCERIN IN D5W 200-5 MCG/ML-% IV SOLN: 0.0000 ug/min | INTRAVENOUS | Status: DC

## 2018-02-23 MED ORDER — TRAMADOL HCL 50 MG PO TABS: 50.0000 mg | ORAL_TABLET | ORAL | Status: DC | PRN

## 2018-02-23 MED ORDER — PROPOFOL 500 MG/50ML IV EMUL
INTRAVENOUS | Status: DC | PRN
  Administered 2018-02-23: 50 ug/kg/min via INTRAVENOUS

## 2018-02-23 MED ORDER — SODIUM CHLORIDE 0.9 % IV SOLN
1.5000 g | Freq: Two times a day (BID) | INTRAVENOUS | Status: DC
  Administered 2018-02-23 – 2018-02-24 (×2): 1.5 g via INTRAVENOUS
  Filled 2018-02-23 (×2): qty 1.5

## 2018-02-23 MED ORDER — FENTANYL CITRATE (PF) 100 MCG/2ML IJ SOLN
INTRAMUSCULAR | Status: AC
  Administered 2018-02-23: 50 ug via INTRAVENOUS
  Filled 2018-02-23: qty 2

## 2018-02-23 MED ORDER — FENTANYL CITRATE (PF) 250 MCG/5ML IJ SOLN
INTRAMUSCULAR | Status: DC | PRN
  Administered 2018-02-23: 50 ug via INTRAVENOUS

## 2018-02-23 MED ORDER — LACTATED RINGERS IV SOLN
INTRAVENOUS | Status: DC | PRN
  Administered 2018-02-23: 10:00:00 via INTRAVENOUS

## 2018-02-23 MED ORDER — SODIUM CHLORIDE 0.9% FLUSH
3.0000 mL | Freq: Two times a day (BID) | INTRAVENOUS | Status: DC
  Administered 2018-02-23 – 2018-02-24 (×2): 3 mL via INTRAVENOUS

## 2018-02-23 MED ORDER — CHLORHEXIDINE GLUCONATE 4 % EX LIQD: 60.0000 mL | Freq: Once | CUTANEOUS | Status: DC

## 2018-02-23 MED ORDER — PROTAMINE SULFATE 10 MG/ML IV SOLN
INTRAVENOUS | Status: DC | PRN
  Administered 2018-02-23: 20 mg via INTRAVENOUS
  Administered 2018-02-23: 10 mg via INTRAVENOUS
  Administered 2018-02-23: 100 mg via INTRAVENOUS

## 2018-02-23 MED ORDER — DEXAMETHASONE SODIUM PHOSPHATE 10 MG/ML IJ SOLN
INTRAMUSCULAR | Status: DC | PRN
  Administered 2018-02-23: 4 mg via INTRAVENOUS

## 2018-02-23 MED ORDER — ATORVASTATIN CALCIUM 10 MG PO TABS
20.0000 mg | ORAL_TABLET | Freq: Every day | ORAL | Status: DC
  Administered 2018-02-24: 20 mg via ORAL
  Filled 2018-02-23: qty 2

## 2018-02-23 MED ORDER — METOPROLOL TARTRATE 5 MG/5ML IV SOLN: 2.5000 mg | INTRAVENOUS | Status: DC | PRN

## 2018-02-23 MED ORDER — SODIUM CHLORIDE 0.9% FLUSH: 3.0000 mL | INTRAVENOUS | Status: DC | PRN

## 2018-02-23 MED ORDER — SODIUM CHLORIDE 0.9 % IV BOLUS
500.0000 mL | Freq: Once | INTRAVENOUS | Status: AC
  Administered 2018-02-23: 500 mL via INTRAVENOUS

## 2018-02-23 MED ORDER — MIDAZOLAM HCL 2 MG/2ML IJ SOLN
1.0000 mg | Freq: Once | INTRAMUSCULAR | Status: AC
  Administered 2018-02-23: 1 mg via INTRAVENOUS

## 2018-02-23 MED ORDER — CLOPIDOGREL BISULFATE 75 MG PO TABS
75.0000 mg | ORAL_TABLET | Freq: Every day | ORAL | Status: DC
  Administered 2018-02-24: 75 mg via ORAL
  Filled 2018-02-23: qty 1

## 2018-02-23 MED ORDER — ASPIRIN EC 81 MG PO TBEC
81.0000 mg | DELAYED_RELEASE_TABLET | Freq: Every day | ORAL | Status: DC
  Administered 2018-02-23 – 2018-02-24 (×2): 81 mg via ORAL
  Filled 2018-02-23 (×2): qty 1

## 2018-02-23 MED ORDER — LIDOCAINE 2% (20 MG/ML) 5 ML SYRINGE
INTRAMUSCULAR | Status: DC | PRN
  Administered 2018-02-23: 50 mg via INTRAVENOUS

## 2018-02-23 MED ORDER — ONDANSETRON HCL 4 MG/2ML IJ SOLN: 4.0000 mg | Freq: Four times a day (QID) | INTRAMUSCULAR | Status: DC | PRN

## 2018-02-23 MED ORDER — HEPARIN (PORCINE) IN NACL 1000-0.9 UT/500ML-% IV SOLN
INTRAVENOUS | Status: DC | PRN
  Administered 2018-02-23 (×2): 500 mL

## 2018-02-23 MED ORDER — ACETAMINOPHEN 650 MG RE SUPP: 650.0000 mg | Freq: Four times a day (QID) | RECTAL | Status: DC | PRN

## 2018-02-23 MED ORDER — PHENYLEPHRINE 40 MCG/ML (10ML) SYRINGE FOR IV PUSH (FOR BLOOD PRESSURE SUPPORT)
PREFILLED_SYRINGE | INTRAVENOUS | Status: DC | PRN
  Administered 2018-02-23 (×6): 80 ug via INTRAVENOUS
  Administered 2018-02-23: 40 ug via INTRAVENOUS
  Administered 2018-02-23: 80 ug via INTRAVENOUS

## 2018-02-23 MED ORDER — ONDANSETRON HCL 4 MG/2ML IJ SOLN
INTRAMUSCULAR | Status: DC | PRN
  Administered 2018-02-23: 4 mg via INTRAVENOUS

## 2018-02-23 MED ORDER — LIDOCAINE HCL (PF) 1 % IJ SOLN
INTRAMUSCULAR | Status: AC
  Filled 2018-02-23: qty 30

## 2018-02-23 MED ORDER — OXYCODONE HCL 5 MG PO TABS: 5.0000 mg | ORAL_TABLET | ORAL | Status: DC | PRN

## 2018-02-23 MED ORDER — ACETAMINOPHEN 500 MG PO TABS
1000.0000 mg | ORAL_TABLET | Freq: Once | ORAL | Status: AC
  Administered 2018-02-23: 1000 mg via ORAL
  Filled 2018-02-23 (×2): qty 2

## 2018-02-23 MED ORDER — CHLORHEXIDINE GLUCONATE 0.12 % MT SOLN
15.0000 mL | Freq: Once | OROMUCOSAL | Status: AC
  Administered 2018-02-23: 15 mL via OROMUCOSAL
  Filled 2018-02-23: qty 15

## 2018-02-23 MED ORDER — MIDAZOLAM HCL 5 MG/5ML IJ SOLN
INTRAMUSCULAR | Status: DC | PRN
  Administered 2018-02-23: 1 mg via INTRAVENOUS

## 2018-02-23 SURGICAL SUPPLY — 30 items
BAG SNAP BAND KOVER 36X36 (MISCELLANEOUS) ×4 IMPLANT
CABLE ADAPT PACING TEMP 12FT (ADAPTER) ×2 IMPLANT
CATH 26 EDWARDS DELIVERY SYS (CATHETERS) ×2 IMPLANT
CATH DIAG 6FR PIGTAIL ANGLED (CATHETERS) ×4 IMPLANT
CATH INFINITI 6F AL1 (CATHETERS) ×2 IMPLANT
CATH INFINITI 6F AL2 (CATHETERS) ×2 IMPLANT
CATH S G BIP PACING (CATHETERS) ×2 IMPLANT
CRIMPER (MISCELLANEOUS) ×2 IMPLANT
DEVICE CLOSURE PERCLS PRGLD 6F (VASCULAR PRODUCTS) IMPLANT
DEVICE INFLATION ATRION QL2530 (MISCELLANEOUS) ×2 IMPLANT
ELECT DEFIB PAD ADLT CADENCE (PAD) ×2 IMPLANT
GUIDEWIRE SAFE TJ AMPLATZ EXST (WIRE) ×2 IMPLANT
KIT HEART LEFT (KITS) ×3 IMPLANT
KIT MICROPUNCTURE NIT STIFF (SHEATH) ×2 IMPLANT
PACK CARDIAC CATHETERIZATION (CUSTOM PROCEDURE TRAY) ×2 IMPLANT
PERCLOSE PROGLIDE 6F (VASCULAR PRODUCTS) ×6
SHEATH 14X36 EDWARDS (SHEATH) ×2 IMPLANT
SHEATH BRITE TIP 6FR 35CM (SHEATH) ×2 IMPLANT
SHEATH PINNACLE 4F 10CM (SHEATH) ×2 IMPLANT
SHEATH PINNACLE 6F 10CM (SHEATH) ×2 IMPLANT
SHEATH PINNACLE 8F 10CM (SHEATH) ×2 IMPLANT
SHEATH PROBE COVER 6X72 (BAG) ×2 IMPLANT
SLEEVE REPOSITIONING LENGTH 30 (MISCELLANEOUS) ×2 IMPLANT
STOPCOCK MORSE 400PSI 3WAY (MISCELLANEOUS) ×4 IMPLANT
TRANSDUCER W/STOPCOCK (MISCELLANEOUS) ×5 IMPLANT
VALVE HEART TRANSCATH SZ3 26MM (Valve) ×2 IMPLANT
WIRE AMPLATZ SS-J .035X180CM (WIRE) ×2 IMPLANT
WIRE EMERALD 3MM-J .035X150CM (WIRE) ×2 IMPLANT
WIRE EMERALD 3MM-J .035X260CM (WIRE) ×2 IMPLANT
WIRE EMERALD ST .035X260CM (WIRE) ×2 IMPLANT

## 2018-02-23 NOTE — Anesthesia Procedure Notes (Signed)
Arterial Line Insertion Start/End2/18/2020 8:10 AM, 02/23/2018 8:20 AM Performed by: Rosiland Oz, CRNA, CRNA  Patient location: Pre-op. Preanesthetic checklist: patient identified, IV checked, site marked, risks and benefits discussed, surgical consent, monitors and equipment checked, pre-op evaluation, timeout performed and anesthesia consent Lidocaine 1% used for infiltration and patient sedated Right, radial was placed Catheter size: 20 G Hand hygiene performed , maximum sterile barriers used  and Seldinger technique used  Attempts: 1 Procedure performed without using ultrasound guided technique. Following insertion, dressing applied and Biopatch. Post procedure assessment: normal and unchanged  Patient tolerated the procedure well with no immediate complications.

## 2018-02-23 NOTE — Anesthesia Postprocedure Evaluation (Signed)
Anesthesia Post Note  Patient: Jerry Ferguson  Procedure(s) Performed: TRANSCATHETER AORTIC VALVE REPLACEMENT, TRANSFEMORAL (N/A ) TRANSESOPHAGEAL ECHOCARDIOGRAM (TEE) (N/A )     Patient location during evaluation: Cath Lab Anesthesia Type: MAC Level of consciousness: awake and alert Pain management: pain level controlled Vital Signs Assessment: post-procedure vital signs reviewed and stable Respiratory status: spontaneous breathing, nonlabored ventilation, respiratory function stable and patient connected to nasal cannula oxygen Cardiovascular status: stable and blood pressure returned to baseline Postop Assessment: no apparent nausea or vomiting Anesthetic complications: no    Last Vitals:  Vitals:   02/23/18 1240 02/23/18 1242  BP: (!) 103/45   Pulse:    Resp: 14   Temp:  36.6 C  SpO2: 99%     Last Pain:  Vitals:   02/23/18 1242  TempSrc: Temporal  PainSc:                  Danyale Ridinger,W. EDMOND

## 2018-02-23 NOTE — Progress Notes (Addendum)
Site area: RFV Site Prior to Removal:  Level 0 Pressure Applied For:15 min Manual: yes   Patient Status During Pull:  stable Post Pull Site:  Level 0 Post Pull Instructions Given: yes   Post Pull Pulses Present:  Dressing Applied:  clear Bedrest begins @ 1310 Comments:

## 2018-02-23 NOTE — Progress Notes (Signed)
Echocardiogram 2D Echocardiogram has been performed.  Jerry Ferguson 02/23/2018, 11:00 AM

## 2018-02-23 NOTE — Progress Notes (Signed)
  [facility]   MULTIDISCIPLINARY HEART VALVE TEAM  Patient doing well s/p TAVR. He is hemodynamically stable but requiring 2 mcg of Levophed. Pre TAVR lab work yesterday showed AKI with creat up from 1 to 1.5 likely 2/2 poor PO intake after dental extractions. I have ordered a 500 cc fluid bolus.   He had a short period of heart block post deployment and temp pacer left in. ECG shows new LBBB with QRS 152 ms, PR but HR is stable in high 50s. Temp pacer has now been removed.   Groin sites stable. Hopefully we can wean of pressors and transfer to 4E. Plan for early ambulation after bedrest completed and hopeful discharge over the next 24-48 hours if no further conduction disturbance.    Cline Crock PA-C  [facility]  Pager [phone]

## 2018-02-23 NOTE — Progress Notes (Signed)
Pt ambulated 444ft in hallway with standby assist to help with IV pole. Pt tolerated very well. Returned to room with call light within reach.

## 2018-02-23 NOTE — Anesthesia Procedure Notes (Signed)
Central Venous Catheter Insertion Performed by: Gaynelle Adu, MD, anesthesiologist Start/End2/18/2020 8:06 AM, 02/23/2018 8:16 AM Patient location: Pre-op. Preanesthetic checklist: patient identified, IV checked, site marked, risks and benefits discussed, surgical consent, monitors and equipment checked, pre-op evaluation, timeout performed and anesthesia consent Position: Trendelenburg Lidocaine 1% used for infiltration and patient sedated Hand hygiene performed , maximum sterile barriers used  and Seldinger technique used Catheter size: 8 Fr Total catheter length 16. Central line was placed.Double lumen Procedure performed using ultrasound guided technique. Ultrasound Notes:anatomy identified, needle tip was noted to be adjacent to the nerve/plexus identified, no ultrasound evidence of intravascular and/or intraneural injection and image(s) printed for medical record Attempts: 1 Following insertion, dressing applied, line sutured and Biopatch. Post procedure assessment: blood return through all ports  Patient tolerated the procedure well with no immediate complications.

## 2018-02-23 NOTE — Transfer of Care (Signed)
Immediate Anesthesia Transfer of Care Note  Patient: Jerry Ferguson  Procedure(s) Performed: TRANSCATHETER AORTIC VALVE REPLACEMENT, TRANSFEMORAL (N/A ) TRANSESOPHAGEAL ECHOCARDIOGRAM (TEE) (N/A )  Patient Location: Cath Lab  Anesthesia Type:MAC  Level of Consciousness: awake, alert  and oriented  Airway & Oxygen Therapy: Patient Spontanous Breathing  Post-op Assessment: Report given to RN and Post -op Vital signs reviewed and stable  Post vital signs: Reviewed and stable  Last Vitals:  Vitals Value Taken Time  BP 107/43 02/23/2018 11:33 AM  Temp 36.5 C 02/23/2018 11:34 AM  Pulse 60 02/23/2018 11:36 AM  Resp 14 02/23/2018 11:36 AM  SpO2 98 % 02/23/2018 11:36 AM  Vitals shown include unvalidated device data.  Last Pain:  Vitals:   02/23/18 1134  TempSrc: Temporal  PainSc: 0-No pain         Complications: No apparent anesthesia complications

## 2018-02-23 NOTE — Op Note (Addendum)
[facility]   MULTIDISCIPLINARY HEART VALVE TEAM   TAVR OPERATIVE NOTE   Date of Procedure:  02/23/2018  Preoperative Diagnosis: Severe Aortic Stenosis   Postoperative Diagnosis: Same   Procedure:    Transcatheter Aortic Valve Replacement - Percutaneous Right Transfemoral Approach  Edwards Sapien 3 THV (size 26 mm, model # 9600TFX, serial # [id number])   Co-Surgeons:  Salvatore Decent. Cornelius Moras, MD and Tonny Bollman, MD  Anesthesiologist:  Rosezella Florida, MD  Echocardiographer:  Charlton Haws, MD  Pre-operative Echo Findings:  Severe aortic stenosis  Normal left ventricular systolic function  Post-operative Echo Findings:  Trivial paravalvular leak  Normal left ventricular systolic function   BRIEF CLINICAL NOTE AND INDICATIONS FOR SURGERY  Patient is a 73 year old male with history of aortic stenosis, coronary artery disease, cerebrovascular disease with high-grade asymptomatic right internal carotid artery stenosis, hypertension, and hypercholesterolemia who has been referred for surgical consultation to discuss treatment options for management of severe symptomatic aortic stenosis and coronary artery disease.  Patient's cardiac history dates back more than 5 years ago when he had 2 brief syncopal episodes.  He underwent carotid duplex scan which revealed moderate bilateral 60-79% internal carotid artery stenosis.  He was evaluated by Dr. Delton See and noted to have a heart murmur on exam.  Echocardiogram revealed normal left ventricular systolic function with mild to moderate aortic stenosis with mean transvalvular gradient estimated 19 mmHg.  He was referred for vascular surgical consultation and initially evaluated by Dr. Imogene Burn.  Medical therapy with follow-up was recommended.  He was lost to follow-up for several years but was referred back by his primary care physician for cardiology consultation and evaluated by Dr. Elease Hashimoto on November 11, 2017.  At that time the  patient reported mild symptoms of exertional shortness of breath and he denied any further episodes of dizziness or near syncope.  Follow-up transthoracic echocardiogram performed November 25, 2017 revealed severe aortic stenosis with normal left ventricular systolic function.  Peak velocity across aortic valve measured 4.8 m/s corresponding to mean transvalvular gradient estimated 50 mmHg.  The DVI was 0.21 with aortic valve area calculated 0.81 cm.  Initially continued observation was recommended.  However, shortly after that the patient was seen by Dr. Chestine Spore for follow-up of his bilateral carotid artery stenosis.  Carotid duplex scan performed January 12, 2018 revealed high-grade 80-99% right internal carotid artery stenosis.  There was 40 to 59% left internal carotid artery stenosis.  Right carotid endarterectomy was recommended and the patient was referred back to Dr. Elease Hashimoto for preoperative cardiac clearance.  The patient was subsequently referred to the multidisciplinary heart valve clinic and underwent left and right heart catheterization by Dr. Excell Seltzer on February 04, 2018.  Catheterization revealed multivessel coronary artery disease with severe diffuse high-grade multisegment stenosis of the right coronary artery and left to right collateral filling of the posterior descending coronary artery and right posterolateral branch.  There was moderate ostial stenosis of the left circumflex coronary artery and nonobstructive disease in the left anterior descending coronary artery.  Right heart pressures were normal.  CT angiography was performed and the patient was referred for surgical consultation.  He was also referred for dental service consultation and has been evaluated earlier this week by Dr. Robin Searing.  Dental extraction has been recommended and the patient has been referred to Dr. Chales Salmon.  Dental extraction was performed the week prior to valve replacement.  During the course of the patient's  preoperative work up they have been evaluated  comprehensively by a multidisciplinary team of specialists coordinated through the Multidisciplinary Heart Valve Clinic in the [facility].  They have been demonstrated to suffer from symptomatic severe aortic stenosis as noted above. The patient has been counseled extensively as to the relative risks and benefits of all options for the treatment of severe aortic stenosis including long term medical therapy, conventional surgery for aortic valve replacement, and transcatheter aortic valve replacement.  All questions have been answered, and the patient provides full informed consent for the operation as described.   DETAILS OF THE OPERATIVE PROCEDURE  PREPARATION:    The patient is brought to the operating room on the above mentioned date and central monitoring was established by the anesthesia team including placement of a central venous line and radial arterial line. The patient is placed in the supine position on the operating table.  Intravenous antibiotics are administered. The patient is monitored closely throughout the procedure under conscious sedation.  Baseline transthoracic echocardiogram was performed. The patient's chest, abdomen, both groins, and both lower extremities are prepared and draped in a sterile manner. A time out procedure is performed.   PERIPHERAL ACCESS:    Using the modified Seldinger technique, femoral arterial access was obtained with placement of a 6 Fr sheath on the left side.  On attempt at placement of a femoral venous sheath the left femoral artery was cannulated a second time, so an additional 4 Fr sheath was placed to avoid bleeding during heparinization.  A 6 Fr venous sheath was placed in the right femoral vein.  A pigtail diagnostic catheter was passed through the left arterial sheath under fluoroscopic guidance into the aortic root.  A temporary transvenous pacemaker catheter was passed  through the right femoral venous sheath under fluoroscopic guidance into the right ventricle.  The pacemaker was tested to ensure stable lead placement and pacemaker capture. Aortic root angiography was performed in order to determine the optimal angiographic angle for valve deployment.   TRANSFEMORAL ACCESS:   Percutaneous transfemoral access and sheath placement was performed using ultrasound guidance.  The right common femoral artery was cannulated using a micropuncture needle and appropriate location was verified using hand injection angiogram.  A pair of Abbott Perclose percutaneous closure devices were placed and a 6 French sheath replaced into the femoral artery.  The patient was heparinized systemically and ACT verified > 250 seconds.    A 14 Fr transfemoral E-sheath was introduced into the right common femoral artery after progressively dilating over an Amplatz superstiff wire. An AL-1 catheter was used to direct a straight-tip exchange length wire across the native aortic valve into the left ventricle. This was exchanged out for a pigtail catheter and position was confirmed in the LV apex. Simultaneous LV and Ao pressures were recorded.  The pigtail catheter was exchanged for an Amplatz Extra-stiff wire in the LV apex.  Echocardiography was utilized to confirm appropriate wire position and no sign of entanglement in the mitral subvalvular apparatus.   TRANSCATHETER HEART VALVE DEPLOYMENT:   An Edwards Sapien 3 transcatheter heart valve (size 26 mm, model #9600TFX, serial #[id number]) was prepared and crimped per manufacturer's guidelines, and the proper orientation of the valve is confirmed on the [employer] delivery system. The valve was advanced through the introducer sheath using normal technique until in an appropriate position in the abdominal aorta beyond the sheath tip. The balloon was then retracted and using the fine-tuning wheel was centered on the valve. The valve was then  advanced across the aortic arch using appropriate flexion of the catheter. The valve was carefully positioned across the aortic valve annulus. The Commander catheter was retracted using normal technique. Once final position of the valve has been confirmed by angiographic assessment, the valve is deployed while temporarily holding ventilation and during rapid ventricular pacing to maintain systolic blood pressure < 50 mmHg and pulse pressure < 10 mmHg. The balloon inflation is held for >3 seconds after reaching full deployment volume. Once the balloon has fully deflated the balloon is retracted into the ascending aorta and valve function is assessed using echocardiography. There is felt to be trivial paravalvular leak and no central aortic insufficiency.  The patient's hemodynamic recovery following valve deployment is good.  The deployment balloon and guidewire are both removed.    PROCEDURE COMPLETION:   The sheath was removed and femoral artery closure performed.  Protamine was administered once femoral arterial repair was complete. The pigtail catheters and femoral arterial sheaths were removed with manual pressure used for hemostasis. The temporary pacing wire was left in place.  The patient tolerated the procedure well and is transported to the surgical intensive care in stable condition. There were no immediate intraoperative complications. All sponge instrument and needle counts are verified correct at completion of the operation.   No blood products were administered during the operation.  The patient received a total of 40 mL of intravenous contrast during the procedure.   Purcell Nailslarence H Owen, MD 02/23/2018 11:24 AM

## 2018-02-23 NOTE — Anesthesia Procedure Notes (Signed)
Procedure Name: MAC Date/Time: 02/23/2018 10:04 AM Performed by: Teressa Lower., CRNA Pre-anesthesia Checklist: Patient identified, Emergency Drugs available, Suction available, Patient being monitored and Timeout performed Patient Re-evaluated:Patient Re-evaluated prior to induction Oxygen Delivery Method: Simple face mask Induction Type: IV induction Ventilation: Oral airway inserted - appropriate to patient size

## 2018-02-23 NOTE — Progress Notes (Signed)
Pt arrived to 4e from [facility] cath lab. Pt oriented to room and staff. Vitals obtained and telemetry box applied. CCMD notified x2. CHG bath completed. Bilateral groin sites level 0. Pt denies pain. Pt instructed on bedrest until 5:10 pm. Pt verbalized understanding. Family at bedside. Will continue to monitor.   Ardeen Jourdain BSN, RN

## 2018-02-23 NOTE — Interval H&P Note (Signed)
History and Physical Interval Note:  02/23/2018 9:43 AM  Jerry Ferguson  has presented today for surgery, with the diagnosis of Severe Aortic Stenosis  The various methods of treatment have been discussed with the patient and family. After consideration of risks, benefits and other options for treatment, the patient has consented to  Procedure(s): TRANSCATHETER AORTIC VALVE REPLACEMENT, TRANSFEMORAL (N/A) TRANSESOPHAGEAL ECHOCARDIOGRAM (TEE) (N/A) as a surgical intervention .  The patient's history has been reviewed, patient examined, no change in status, stable for surgery.  I have reviewed the patient's chart and labs.  Questions were answered to the patient's satisfaction.     Purcell Nails

## 2018-02-23 NOTE — Op Note (Signed)
[facility]   MULTIDISCIPLINARY HEART VALVE TEAM   TAVR OPERATIVE NOTE   Date of Procedure:  02/23/2018  Preoperative Diagnosis: Severe Aortic Stenosis   Postoperative Diagnosis: Same   Procedure:    Transcatheter Aortic Valve Replacement - Percutaneous  Transfemoral Approach  Edwards Sapien 3 THV (size 26 mm, model # 9600TFX, serial # [id number])   Co-Surgeons:  Salvatore Decent. Cornelius Moras, MD, MD and Tonny Bollman, MD  Anesthesiologist:  Rosezella Florida, MD  Echocardiographer:  Charlton Haws, MD  Pre-operative Echo Findings:  Severe aortic stenosis  Normal left ventricular systolic function  Post-operative Echo Findings:  Trivial paravalvular leak  Normal left ventricular systolic function  BRIEF CLINICAL NOTE AND INDICATIONS FOR SURGERY  Please see the complete note of Dr Cornelius Moras for full background and indications.  Following the decision to proceed with transcatheter aortic valve replacement, a discussion has been held regarding what types of management strategies would be attempted intraoperatively in the event of life-threatening complications, including whether or not the patient would be considered a candidate for the use of cardiopulmonary bypass and/or conversion to open sternotomy for attempted surgical intervention.  The patient has been advised of a variety of complications that might develop peculiar to this approach including but not limited to risks of death, stroke, paravalvular leak, aortic dissection or other major vascular complications, aortic annulus rupture, device embolization, cardiac rupture or perforation, acute myocardial infarction, arrhythmia, heart block or bradycardia requiring permanent pacemaker placement, congestive heart failure, respiratory failure, renal failure, pneumonia, infection, other late complications related to structural valve deterioration or migration, or other complications that might ultimately cause a temporary or  permanent loss of functional independence or other long term morbidity.  The patient provides full informed consent for the procedure as described and all questions were answered preoperatively.  DETAILS OF THE OPERATIVE PROCEDURE  PREPARATION:   The patient is brought to the operating room on the above mentioned date and central monitoring was established by the anesthesia team including placement of a central venous catheter and radial arterial line. The patient is placed in the supine position on the operating table.  Intravenous antibiotics are administered. The patient is monitored closely throughout the procedure under conscious sedation.  Baseline transthoracic echocardiogram is performed. The patient's chest, abdomen, both groins, and both lower extremities are prepared and draped in a sterile manner. A time out procedure is performed.  PERIPHERAL ACCESS:   Using ultrasound guidance, femoral arterial access is obtained with placement of 6 Fr sheaths on the left side.  A second 4 Fr Sheath is placed in the left CFA. Venous access is obtained on the right CFV with US guidance. A pigtail diagnostic catheter was passed through the femoral arterial sheath under fluoroscopic guidance into the aortic root.  A temporary transvenous pacemaker catheter was passed through the femoral venous sheath under fluoroscopic guidance into the right ventricle.  The pacemaker was tested to ensure stable lead placement and pacemaker capture. Aortic root angiography was performed in order to determine the optimal angiographic angle for valve deployment.  TRANSFEMORAL ACCESS:  A micropuncture technique is used to access the right femoral artery under fluoroscopic and ultrasound guidance.  2 Perclose devices are deployed at 10' and 2' positions to 'PreClose' the femoral artery. An 8 French sheath is placed and then an Amplatz Superstiff wire is advanced through the sheath. This is changed out for a 14 French transfemoral  E-Sheath after progressively dilating over the Superstiff wire.  An AL-1 catheter  was used to direct a straight-tip exchange length wire across the native aortic valve into the left ventricle. This was exchanged out for a pigtail catheter and position was confirmed in the LV apex. Simultaneous LV and Ao pressures were recorded.  The pigtail catheter was exchanged for an Amplatz Extra-stiff wire in the LV apex.   BALLOON AORTIC VALVULOPLASTY:  Not performed  TRANSCATHETER HEART VALVE DEPLOYMENT:  An Edwards Sapien 3 transcatheter heart valve (size 26 mm, model #9600TFX, serial #[id number]) was prepared and crimped per manufacturer's guidelines, and the proper orientation of the valve is confirmed on the [employer] delivery system. The valve was advanced through the introducer sheath using normal technique until in an appropriate position in the abdominal aorta beyond the sheath tip. The balloon was then retracted and using the fine-tuning wheel was centered on the valve. The valve was then advanced across the aortic arch using appropriate flexion of the catheter. The valve was carefully positioned across the aortic valve annulus. The Commander catheter was retracted using normal technique. Once final position of the valve has been confirmed by angiographic assessment, the valve is deployed while temporarily holding ventilation and during rapid ventricular pacing to maintain systolic blood pressure < 50 mmHg and pulse pressure < 10 mmHg. The balloon inflation is held for >3 seconds after reaching full deployment volume. Once the balloon has fully deflated the balloon is retracted into the ascending aorta and valve function is assessed using echocardiography. There is felt to be trivial paravalvular leak and no central aortic insufficiency.  The patient's hemodynamic recovery following valve deployment is good.  The deployment balloon and guidewire are both removed. Echo demostrated acceptable post-procedural  gradients, stable mitral valve function, and trace aortic insufficiency.   PROCEDURE COMPLETION:  The sheath was removed and femoral artery closure is performed using the 2 previously deployed Perclose devices.  Protamine is administered once femoral arterial repair was complete. The site is clear with no evidence of bleeding or hematoma after the sutures are tightened. The temporary pacemaker, pigtail catheters and femoral sheaths were removed with manual pressure used for hemostasis.   The patient tolerated the procedure well and is transported to the surgical intensive care in stable condition. There were no immediate intraoperative complications. All sponge instrument and needle counts are verified correct at completion of the operation.   The patient received a total of 40 mL of intravenous contrast during the procedure.  Tonny Bollman, MD 02/23/2018 11:53 AM

## 2018-02-24 ENCOUNTER — Encounter (HOSPITAL_COMMUNITY): Payer: Self-pay | Admitting: Cardiovascular Disease

## 2018-02-24 ENCOUNTER — Other Ambulatory Visit: Payer: Self-pay | Admitting: Physician Assistant

## 2018-02-24 ENCOUNTER — Inpatient Hospital Stay (HOSPITAL_COMMUNITY): Payer: PPO

## 2018-02-24 DIAGNOSIS — Z952 Presence of prosthetic heart valve: Secondary | ICD-10-CM

## 2018-02-24 DIAGNOSIS — I340 Nonrheumatic mitral (valve) insufficiency: Secondary | ICD-10-CM

## 2018-02-24 LAB — CBC
HCT: 36.5 % — ABNORMAL LOW (ref 39.0–52.0)
Hemoglobin: 12 g/dL — ABNORMAL LOW (ref 13.0–17.0)
MCH: 28.7 pg (ref 26.0–34.0)
MCHC: 32.9 g/dL (ref 30.0–36.0)
MCV: 87.3 fL (ref 80.0–100.0)
Platelets: 171 10*3/uL (ref 150–400)
RBC: 4.18 MIL/uL — ABNORMAL LOW (ref 4.22–5.81)
RDW: 12.9 % (ref 11.5–15.5)
WBC: 13.3 10*3/uL — ABNORMAL HIGH (ref 4.0–10.5)
nRBC: 0 % (ref 0.0–0.2)

## 2018-02-24 LAB — ECHOCARDIOGRAM LIMITED
HEIGHTINCHES: 71 in
Weight: 3112 oz

## 2018-02-24 LAB — BASIC METABOLIC PANEL
Anion gap: 11 (ref 5–15)
BUN: 12 mg/dL (ref 8–23)
CO2: 22 mmol/L (ref 22–32)
CREATININE: 0.98 mg/dL (ref 0.61–1.24)
Calcium: 8.8 mg/dL — ABNORMAL LOW (ref 8.9–10.3)
Chloride: 105 mmol/L (ref 98–111)
GFR calc Af Amer: >60 mL/min (ref >60)
GFR calc non Af Amer: >60 mL/min (ref >60)
Glucose, Bld: 109 mg/dL — ABNORMAL HIGH (ref 70–99)
Potassium: 3.7 mmol/L (ref 3.5–5.1)
Sodium: 138 mmol/L (ref 135–145)

## 2018-02-24 LAB — MAGNESIUM: Magnesium: 2 mg/dL (ref 1.7–2.4)

## 2018-02-24 MED ORDER — CLOPIDOGREL BISULFATE 75 MG PO TABS: 75.0000 mg | ORAL_TABLET | Freq: Every day | ORAL | 1 refills | Status: DC

## 2018-02-24 MED FILL — Heparin Sodium (Porcine) Inj 1000 Unit/ML: INTRAMUSCULAR | Qty: 30 | Status: AC

## 2018-02-24 MED FILL — Magnesium Sulfate Inj 50%: INTRAMUSCULAR | Qty: 10 | Status: AC

## 2018-02-24 MED FILL — Potassium Chloride Inj 2 mEq/ML: INTRAVENOUS | Qty: 40 | Status: AC

## 2018-02-24 NOTE — Progress Notes (Signed)
CARDIAC REHAB PHASE I   PRE:  Rate/Rhythm: 80 SR  BP:  Sitting: 138/67      SaO2: 97 RA  MODE:  Ambulation: 400 ft   POST:  Rate/Rhythm: 107 ST  BP:  Sitting: 160/66    SaO2: 96 RA   Pt ambulated 446ft independently with steady gait. Pt states breathing is "alright". Pt and family educated on site care. Encouraged ambulation as able, but stressed importance of listening to his body. Pt given heart healthy diet, pt does not seem interested in changing his diet. Offered CRP II, pt declining at this time, left brochure at bedside and told to let the office know if he changes his mind.  [phone] Jerry Bowen, RN BSN 02/24/2018 10:56 AM

## 2018-02-24 NOTE — Progress Notes (Signed)
Pt discharged home with family. Pt received discharge instructions and all questions were answered. Pt was instructed to pickup Plavix from his pharmacy. Pt verbalized understanding. IV and telemetry box removed. Pt left with all of his belongings. Pt discharged via wheelchair and was accompanied by a Ferne Coe and pt's daughter.   Ardeen Jourdain BSN, RN

## 2018-02-24 NOTE — Discharge Summary (Addendum)
[facility]   MULTIDISCIPLINARY HEART VALVE TEAM  Discharge Summary    Patient ID: ABDULLAH RIZZI MRN: [id number]; DOB: [birth year]  Admit date: 02/23/2018 Discharge date: 02/24/2018  Primary Care Provider: Esperanza Richters, PA-C  Primary Cardiologist: Kristeen Miss, MD / Dr. Excell Seltzer & Dr. Cornelius Moras (TAVR)  Discharge Diagnoses    Principal Problem:   S/P TAVR (transcatheter aortic valve replacement) Active Problems:   HLD (hyperlipidemia)   GERD   Severe aortic stenosis   Essential hypertension   Carotid artery disease (HCC)   Coronary artery disease involving native coronary artery of native heart without angina pectoris   Pulmonary nodules   Allergies No Known Allergies  Diagnostic Studies/Procedures    TAVR OPERATIVE NOTE   Date of Procedure:                02/23/2018  Preoperative Diagnosis:      Severe Aortic Stenosis   Postoperative Diagnosis:    Same  Procedure:        Transcatheter Aortic Valve Replacement - Percutaneous Right Transfemoral Approach             Edwards Sapien 3 THV (size 26 mm, model # 9600TFX, serial # [id number])              Co-Surgeons:                        Salvatore Decent. Cornelius Moras, MD and Tonny Bollman, MD  Anesthesiologist:                  Rosezella Florida, MD  Echocardiographer:              Charlton Haws, MD  Pre-operative Echo Findings: ? Severe aortic stenosis ? Normal left ventricular systolic function  Post-operative Echo Findings: ? Trivial paravalvular leak ? Normal left ventricular systolic function  _____________   Echo 02/24/18: pending formal read    History of Present Illness     Jerry Ferguson is a 73 y.o. male with a history of severe single vessel CAD with diffuse stenosis of RCA, carotid artery disease (80-99% RICA stenosis), HTN, HLD and severe AS who presented to [facility] on 02/23/18 for planned TAVR.   Patient's cardiac history dates back more than 5 years ago when he had 2 brief syncopal  episodes. He underwent carotid duplex scan which revealed moderate bilateral 60-79%internal carotid artery stenosis. He was evaluated by Dr. Delton See and noted to have a heart murmur on exam. Echocardiogram revealed normal left ventricular systolic function with mild to moderate aortic stenosis with mean transvalvular gradient estimated 19 mmHg. He was referred for vascular surgical consultation and initially evaluated by Dr. Imogene Burn. Medical therapy with follow-up was recommended. He was lost to follow-up for several years but was referred back by his primary care physician for cardiology consultation and evaluated by Dr. Elease Hashimoto on November 11, 2017. At that time the patient reported mild symptoms of exertional shortness of breath and he denied any further episodes of dizziness or near syncope. Follow-up transthoracic echocardiogram performed November 25, 2017 revealed severe aortic stenosis with normal left ventricular systolic function. Peak velocity across aortic valve measured 4.8 m/s corresponding to mean transvalvular gradient estimated 50 mmHg. The DVI was 0.21 with aortic valve area calculated 0.81 cm. Initially continued observation was recommended. However, shortly after that the patient was seen by Dr. Chestine Spore for follow-up of his bilateral carotid artery stenosis. Carotid duplex scan performed January 12, 2018 revealed  high-grade 80-99%right internal carotid artery stenosis. There was 40 to 59% left internal carotid artery stenosis. Right carotid endarterectomy was recommended and the patient was referred backto Dr. Phineas Semen preoperative cardiac clearance. The patient was subsequently referred to the multidisciplinary heart valve clinic and underwent left and right heart catheterization by Dr. Excell Seltzer on February 04, 2018. Catheterization revealed multivessel coronary artery disease with severe diffuse high-grade multisegment stenosis of the right coronary artery and left to right collateral  filling of the posterior descending coronary artery and right posterolateral branch. There was moderate ostial stenosis of the left circumflex coronary artery and nonobstructive disease in the left anterior descending coronary artery.  The patient has been evaluated by the multidisciplinary valve team and felt to have severe, symptomatic aortic stenosis and to be a suitable candidate for TAVR, which was set up for 02/23/18.   Hospital Course     Consultants: none  Severe AS:s/p successful TAVR with a 26 mm Edwards Sapien 3 THV via the TF approach on 02/23/18. Post operative echo completed but pending formal read. Groin sites are stable. ECG initially showed a new LBBB, but this has resolved. He has had no evidence of high grade heart block. Continue Asprin and Plavix. Plan for DC home today with 1 week TOC visit.  CAD: pre TAVR cath showed severe single vessel CAD with diffuse stenosis of RCA. Plan for medical therapy for now.   Carotid artery disease: he has a known 80-99% RICA stenosis followed by Dr. Chestine Spore. He has an appointment with [facility] in April.   HTN: BP mildly elevated today, plan to resume home Lisinopril 5mg  daily.   Pulmonary nodules: pre TAVR scans showed multiple small pulmonary nodules scattered throughout both lungs measuring 5 mm or less in size, nonspecific. Follow up CT in 12 months if pt is high risk. I will discuss this with the patient in the outpatient setting.  _____________  Discharge Vitals Blood pressure (!) 145/67, pulse 87, temperature 98.3 F (36.8 C), temperature source Oral, resp. rate 18, height 5\' 11"  (1.803 m), weight 88.2 kg, SpO2 98 %.  Filed Weights   02/23/18 0707 02/24/18 0418  Weight: 88.5 kg 88.2 kg   VS:  BP (!) 145/67 (BP Location: Left Arm)   Pulse 87   Temp 98.3 F (36.8 C) (Oral)   Resp 18   Ht 5\' 11"  (1.803 m)   Wt 88.2 kg   SpO2 98%   BMI 27.13 kg/m    GEN: Well nourished, well developed, in no acute distress HEENT: normal Neck: no  JVD or masses Cardiac: RRR; no murmurs, rubs, or gallops,no edema  Respiratory:  clear to auscultation bilaterally, normal work of breathing GI: soft, nontender, nondistended, + BS MS: no deformity or atrophy Skin: warm and dry, no rash. Groin sites stable Neuro:  Alert and Oriented x 3, Strength and sensation are intact Psych: euthymic mood, full affect   Labs & Radiologic Studies    CBC Recent Labs    02/22/18 1430  02/23/18 1145 02/24/18 0431  WBC 10.4  --   --  13.3*  HGB 14.7   < > 10.2* 12.0*  HCT 45.4   < > 30.0* 36.5*  MCV 87.6  --   --  87.3  PLT 278  --   --  171   < > = values in this interval not displayed.   Basic Metabolic Panel Recent Labs    30/86/57 1430  02/23/18 1108 02/23/18 1145 02/23/18 1149 02/24/18 0431  NA 138   < > 141 140  --  138  K 4.5   < > 4.9 5.0  --  3.7  CL 105  --   --   --   --  105  CO2 19*  --   --   --   --  22  GLUCOSE 117*   < > 111*  --   --  109*  BUN 18  --   --   --   --  12  CREATININE 1.51*  --   --   --  1.00 0.98  CALCIUM 9.5  --   --   --   --  8.8*  MG  --   --   --   --   --  2.0   < > = values in this interval not displayed.   Liver Function Tests Recent Labs    02/22/18 1430  AST 23  ALT 21  ALKPHOS 55  BILITOT 2.5*  PROT 7.1  ALBUMIN 4.2   No results for input(s): LIPASE, AMYLASE in the last 72 hours. Cardiac Enzymes No results for input(s): CKTOTAL, CKMB, CKMBINDEX, TROPONINI in the last 72 hours. BNP Invalid input(s): POCBNP D-Dimer No results for input(s): DDIMER in the last 72 hours. Hemoglobin A1C Recent Labs    02/22/18 1440  HGBA1C 5.2   Fasting Lipid Panel No results for input(s): CHOL, HDL, LDLCALC, TRIG, CHOLHDL, LDLDIRECT in the last 72 hours. Thyroid Function Tests No results for input(s): TSH, T4TOTAL, T3FREE, THYROIDAB in the last 72 hours.  Invalid input(s): FREET3 _____________  Dg Chest 2 View  Result Date: 02/23/2018 CLINICAL DATA:  Aortic stenosis, preop EXAM: CHEST -  2 VIEW COMPARISON:  Chest CT 02/05/2018 FINDINGS: Normal heart size and mediastinal contours. No acute infiltrate or edema. No effusion or pneumothorax. No acute osseous findings. IMPRESSION: No evidence of active disease. Electronically Signed   By: Marnee Spring M.D.   On: 02/23/2018 07:16   Ct Coronary Morph W/cta Cor W/score W/ca W/cm &/or Wo/cm  Addendum Date: 02/08/2018   ADDENDUM REPORT: 02/08/2018 08:47 EXAM: OVER-READ INTERPRETATION  CT CHEST The following report is an over-read performed by radiologist Dr. Royal Piedra [facility] on 02/08/2018. This over-read does not include interpretation of cardiac or coronary anatomy or pathology. The cardiac CTA interpretation by the cardiologist is attached. COMPARISON:  None. FINDINGS: Extracardiac findings will be described separately under dictation for contemporaneously obtained CTA chest, abdomen and pelvis. IMPRESSION: Please see separate dictation for contemporaneously obtained CTA chest, abdomen and pelvis dated 02/05/2018 for full description of relevant extracardiac findings. Electronically Signed   By: Trudie Reed M.D.   On: 02/08/2018 08:47   Result Date: 02/08/2018 CLINICAL DATA:  73 year old male with severe aortic stenosis being evaluated for a TAVR procedure. EXAM: Cardiac TAVR CT TECHNIQUE: The patient was scanned on a [employer]. A 120 kV retrospective scan was triggered in the descending thoracic aorta at 111 HU's. Gantry rotation speed was 250 msecs and collimation was .6 mm. No beta blockade or nitro were given. The 3D data set was reconstructed in 5% intervals of the R-R cycle. Systolic and diastolic phases were analyzed on a dedicated work station using MPR, MIP and VRT modes. The patient received 80 cc of contrast. FINDINGS: Aortic Valve: Trileaflet aortic valve with severely thickened and calcified leaflets and no calcifications extending into the LVOT. Aorta: Normal size with only mild diffuse  atherosclerotic plaque and calcifications and no dissection.  Sinotubular Junction: 30 x 29 mm Ascending Thoracic Aorta: 38 x 36 mm Aortic Arch: 26 x 26 mm Descending Thoracic Aorta: 24 x 24 mm Sinus of Valsalva Measurements: Non-coronary: 30 mm Right -coronary: 32 mm Left -coronary: 32 mm Coronary Artery Height above Annulus: Left Main: 19 mm Right Coronary: 16 mm Virtual Basal Annulus Measurements: Maximum/Minimum Diameter: 25.8 x 23.0 mm Mean Diameter: 24.4 mm Perimeter: 77.8 mm Area: 468 mm2 Optimum Fluoroscopic Angle for Delivery: LAO 12 CAU 10. IMPRESSION: 1. Trileaflet aortic valve with severely thickened and calcified leaflets and no calcifications extending into the LVOT. Annular measurements suitable for delivery of a 26 mm Edwards-SAPIEN 3 valve. 2. Sufficient coronary to annulus distance. 3. Optimum Fluoroscopic Angle for Delivery: LAO 12 CAU 10. 4. No thrombus in the left atrial appendage. Electronically Signed: By: Tobias Alexander On: 02/05/2018 17:04   Dg Chest Port 1 View  Result Date: 02/23/2018 CLINICAL DATA:  73 year old male status post TAVR. EXAM: PORTABLE CHEST 1 VIEW COMPARISON:  Chest x-ray 02/22/2018. FINDINGS: There is a right-sided internal jugular central venous catheter with tip terminating in the mid superior vena cava. Transcatheter aortic valve in position. Lung volumes are normal. No consolidative airspace disease. No pleural effusions. No pneumothorax. No pulmonary nodule or mass noted. Pulmonary vasculature and the cardiomediastinal silhouette are within normal limits. Atherosclerotic calcifications in the thoracic aorta. IMPRESSION: 1. Support apparatus and postprocedural changes, as above. 2. No radiographic evidence of acute cardiopulmonary disease. 3. Aortic atherosclerosis. Electronically Signed   By: Trudie Reed M.D.   On: 02/23/2018 15:57   Ct Angio Chest Aorta W &/or Wo Contrast  Result Date: 02/08/2018 CLINICAL DATA:  73 year old male with history of  severe aortic stenosis. Preprocedural study prior to potential transcatheter aortic valve replacement (TAVR) procedure. EXAM: CT ANGIOGRAPHY CHEST, ABDOMEN AND PELVIS TECHNIQUE: Multidetector CT imaging through the chest, abdomen and pelvis was performed using the standard protocol during bolus administration of intravenous contrast. Multiplanar reconstructed images and MIPs were obtained and reviewed to evaluate the vascular anatomy. CONTRAST:  ISOVUE-370 IOPAMIDOL (ISOVUE-370) INJECTION 76% COMPARISON:  No priors. FINDINGS: CTA CHEST FINDINGS Cardiovascular: Heart size is mildly enlarged with concentric left ventricular hypertrophy. There is no significant pericardial fluid, thickening or pericardial calcification. There is aortic atherosclerosis, as well as atherosclerosis of the great vessels of the mediastinum and the coronary arteries, including calcified atherosclerotic plaque in the left main, left anterior descending and right coronary arteries. Severe thickening and calcification of the aortic valve. Mediastinum/Lymph Nodes: No pathologically enlarged mediastinal or hilar lymph nodes. Esophagus is unremarkable in appearance. No axillary lymphadenopathy. Lungs/Pleura: Multiple small pulmonary nodules scattered throughout both lungs measuring 5 mm or less in size. No larger more suspicious appearing pulmonary nodules or masses are noted. No acute consolidative airspace disease. No pleural effusions. Musculoskeletal/Soft Tissues: There are no aggressive appearing lytic or blastic lesions noted in the visualized portions of the skeleton. CTA ABDOMEN AND PELVIS FINDINGS Hepatobiliary: No suspicious cystic or solid hepatic lesions. No intra or extrahepatic biliary ductal dilatation. Gallbladder is normal in appearance. Pancreas: No pancreatic mass. No pancreatic ductal dilatation. No pancreatic or peripancreatic fluid or inflammatory changes. Spleen: Unremarkable. Adrenals/Urinary Tract: Bilateral kidneys  and bilateral adrenal glands are normal in appearance. No hydroureteronephrosis. Urinary bladder is normal in appearance. Stomach/Bowel: Normal appearance of the stomach. No pathologic dilatation of small bowel or colon. The appendix is not confidently identified and may be surgically absent. Regardless, there are no inflammatory changes noted adjacent to the  cecum to suggest the presence of an acute appendicitis at this time. Vascular/Lymphatic: Aortic atherosclerosis, without evidence of aneurysm or dissection in the abdominal or pelvic vasculature. Vascular findings and measurements pertinent to potential TAVR procedure, as detailed below. No lymphadenopathy noted in the abdomen or pelvis. Reproductive: Prostate gland is mildly enlarged and heterogeneous in appearance measuring 4.8 x 5.2 cm. Seminal vesicles are unremarkable in appearance. Other: No significant volume of ascites.  No pneumoperitoneum. Musculoskeletal: There are no aggressive appearing lytic or blastic lesions noted in the visualized portions of the skeleton. VASCULAR MEASUREMENTS PERTINENT TO TAVR: AORTA: Minimal Aortic Diameter-14 x 9 mm Severity of Aortic Calcification-severe RIGHT PELVIS: Right Common Iliac Artery - Minimal Diameter-9.8 x 8.8 mm Tortuosity-mild Calcification-mild Right External Iliac Artery - Minimal Diameter-7.6 x 7.5 mm Tortuosity - mild Calcification - mild Right Common Femoral Artery - Minimal Diameter-7.1 x 5.5 mm Tortuosity - mild Calcification - mild LEFT PELVIS: Left Common Iliac Artery - Minimal Diameter-9.7 x 7.5 mm Tortuosity - mild Calcification-mild Left External Iliac Artery - Minimal Diameter-7.1 x 6.8 mm Tortuosity - mild Calcification-none Left Common Femoral Artery - Minimal Diameter-5.8 x 2.9 mm Tortuosity - mild Calcification-severe Review of the MIP images confirms the above findings. IMPRESSION: 1. Vascular findings and measurements pertinent to potential TAVR procedure, as detailed above. 2. Severe  thickening and calcification of the aortic valve, compatible with the reported clinical history of severe aortic stenosis. 3. Cardiomegaly with left ventricular concentric hypertrophy. 4. Aortic atherosclerosis, in addition to left main and 2 vessel coronary artery disease. 5. Multiple small pulmonary nodules scattered throughout both lungs measuring 5 mm or less in size, nonspecific. No follow-up needed if patient is low-risk (and has no known or suspected primary neoplasm). Non-contrast chest CT can be considered in 12 months if patient is high-risk. This recommendation follows the consensus statement: Guidelines for Management of Incidental Pulmonary Nodules Detected on CT Images: From the Fleischner Society 2017; Radiology 2017; 284:228-243. 6. Prostatomegaly. 7. Additional incidental findings, as above. Electronically Signed   By: Trudie Reed M.D.   On: 02/08/2018 11:12   Ct Angio Abdomen Pelvis  W &/or Wo Contrast  Result Date: 02/08/2018 CLINICAL DATA:  73 year old male with history of severe aortic stenosis. Preprocedural study prior to potential transcatheter aortic valve replacement (TAVR) procedure. EXAM: CT ANGIOGRAPHY CHEST, ABDOMEN AND PELVIS TECHNIQUE: Multidetector CT imaging through the chest, abdomen and pelvis was performed using the standard protocol during bolus administration of intravenous contrast. Multiplanar reconstructed images and MIPs were obtained and reviewed to evaluate the vascular anatomy. CONTRAST:  ISOVUE-370 IOPAMIDOL (ISOVUE-370) INJECTION 76% COMPARISON:  No priors. FINDINGS: CTA CHEST FINDINGS Cardiovascular: Heart size is mildly enlarged with concentric left ventricular hypertrophy. There is no significant pericardial fluid, thickening or pericardial calcification. There is aortic atherosclerosis, as well as atherosclerosis of the great vessels of the mediastinum and the coronary arteries, including calcified atherosclerotic plaque in the left main, left  anterior descending and right coronary arteries. Severe thickening and calcification of the aortic valve. Mediastinum/Lymph Nodes: No pathologically enlarged mediastinal or hilar lymph nodes. Esophagus is unremarkable in appearance. No axillary lymphadenopathy. Lungs/Pleura: Multiple small pulmonary nodules scattered throughout both lungs measuring 5 mm or less in size. No larger more suspicious appearing pulmonary nodules or masses are noted. No acute consolidative airspace disease. No pleural effusions. Musculoskeletal/Soft Tissues: There are no aggressive appearing lytic or blastic lesions noted in the visualized portions of the skeleton. CTA ABDOMEN AND PELVIS FINDINGS Hepatobiliary: No suspicious cystic or solid  hepatic lesions. No intra or extrahepatic biliary ductal dilatation. Gallbladder is normal in appearance. Pancreas: No pancreatic mass. No pancreatic ductal dilatation. No pancreatic or peripancreatic fluid or inflammatory changes. Spleen: Unremarkable. Adrenals/Urinary Tract: Bilateral kidneys and bilateral adrenal glands are normal in appearance. No hydroureteronephrosis. Urinary bladder is normal in appearance. Stomach/Bowel: Normal appearance of the stomach. No pathologic dilatation of small bowel or colon. The appendix is not confidently identified and may be surgically absent. Regardless, there are no inflammatory changes noted adjacent to the cecum to suggest the presence of an acute appendicitis at this time. Vascular/Lymphatic: Aortic atherosclerosis, without evidence of aneurysm or dissection in the abdominal or pelvic vasculature. Vascular findings and measurements pertinent to potential TAVR procedure, as detailed below. No lymphadenopathy noted in the abdomen or pelvis. Reproductive: Prostate gland is mildly enlarged and heterogeneous in appearance measuring 4.8 x 5.2 cm. Seminal vesicles are unremarkable in appearance. Other: No significant volume of ascites.  No pneumoperitoneum.  Musculoskeletal: There are no aggressive appearing lytic or blastic lesions noted in the visualized portions of the skeleton. VASCULAR MEASUREMENTS PERTINENT TO TAVR: AORTA: Minimal Aortic Diameter-14 x 9 mm Severity of Aortic Calcification-severe RIGHT PELVIS: Right Common Iliac Artery - Minimal Diameter-9.8 x 8.8 mm Tortuosity-mild Calcification-mild Right External Iliac Artery - Minimal Diameter-7.6 x 7.5 mm Tortuosity - mild Calcification - mild Right Common Femoral Artery - Minimal Diameter-7.1 x 5.5 mm Tortuosity - mild Calcification - mild LEFT PELVIS: Left Common Iliac Artery - Minimal Diameter-9.7 x 7.5 mm Tortuosity - mild Calcification-mild Left External Iliac Artery - Minimal Diameter-7.1 x 6.8 mm Tortuosity - mild Calcification-none Left Common Femoral Artery - Minimal Diameter-5.8 x 2.9 mm Tortuosity - mild Calcification-severe Review of the MIP images confirms the above findings. IMPRESSION: 1. Vascular findings and measurements pertinent to potential TAVR procedure, as detailed above. 2. Severe thickening and calcification of the aortic valve, compatible with the reported clinical history of severe aortic stenosis. 3. Cardiomegaly with left ventricular concentric hypertrophy. 4. Aortic atherosclerosis, in addition to left main and 2 vessel coronary artery disease. 5. Multiple small pulmonary nodules scattered throughout both lungs measuring 5 mm or less in size, nonspecific. No follow-up needed if patient is low-risk (and has no known or suspected primary neoplasm). Non-contrast chest CT can be considered in 12 months if patient is high-risk. This recommendation follows the consensus statement: Guidelines for Management of Incidental Pulmonary Nodules Detected on CT Images: From the Fleischner Society 2017; Radiology 2017; 284:228-243. 6. Prostatomegaly. 7. Additional incidental findings, as above. Electronically Signed   By: Trudie Reed M.D.   On: 02/08/2018 11:12   Disposition   Pt is  being discharged home today in good condition.  Follow-up Plans & Appointments    Follow-up Information    Janetta Hora, PA-C. Go on 03/03/2018.   Specialties:  Cardiology, Radiology Why:  @ 3:30, please arrive at least 10 minutes early.  Contact information: [address] [place] [address] [phone]            Discharge Medications   Allergies as of 02/24/2018   No Known Allergies     Medication List    TAKE these medications   amoxicillin 500 MG capsule Commonly known as:  AMOXIL Take four capsules one hour before dental appointment. What changed:    how much to take  how to take this  when to take this  additional instructions   aspirin EC 81 MG tablet Take 81 mg by mouth daily.  atorvastatin 20 MG tablet Commonly known as:  LIPITOR Take 1 tablet (20 mg total) by mouth daily.   chlorhexidine 0.12 % solution Commonly known as:  PERIDEX Rinse with 15 mls twice daily for 30 seconds. Use after breakfast and at bedtime. Spit out excess. Do not swallow. What changed:    how much to take  how to take this  when to take this   clopidogrel 75 MG tablet Commonly known as:  PLAVIX Take 1 tablet (75 mg total) by mouth daily with breakfast. Start taking on:  February 25, 2018   lisinopril 5 MG tablet Commonly known as:  PRINIVIL,ZESTRIL TAKE 1 TABLET BY MOUTH EVERY DAY What changed:    how much to take  how to take this  when to take this  additional instructions           Outstanding Labs/Studies   None   Duration of Discharge Encounter   Greater than 30 minutes including physician time.  SignedCline Crock, Kathryn Thompson, PA-C 02/24/2018, 10:56 AM [phone]  Patient seen, examined. Available data reviewed. Agree with findings, assessment, and plan as outlined by Carlean JewsKatie Thompson, PA-C. The patient is independently interviewed and examined. He is in NAD, lungs CTA, heart RRR with a 1/6 SEM at the RUSB, no  diastolic murmur. Bilateral groin sites clear with no hematoma. No peripheral edema. Tele shows sinus rhythm with normalization of QRS duration. No brady events or evidence of high-grade heart block. He appears stable for discharge this morning. We discussed timing of carotid endarterectomy and plans to treat CAD medically.   Tonny BollmanMichael Malu Pellegrini, M.D. 02/24/2018 8:39 PM

## 2018-02-24 NOTE — Discharge Instructions (Signed)

## 2018-02-24 NOTE — Progress Notes (Signed)
  Echocardiogram 2D Echocardiogram has been performed.  Jerry Ferguson 02/24/2018, 8:31 AM

## 2018-02-25 ENCOUNTER — Telehealth: Payer: Self-pay

## 2018-02-25 NOTE — Telephone Encounter (Signed)
Patient contacted regarding discharge from [facility] on 02/24/2018.  Patient understands to follow up with provider Cline Crock PA-C on 03/03/2018 at 3:30 PM at [facility] office. Patient understands discharge instructions? yes Patient understands medications and regiment? yes Patient understands to bring all medications to this visit? Yes  The pt is doing well at this time and has no additional questions or concerns.

## 2018-03-01 NOTE — Progress Notes (Signed)
[facility]   MULTIDISCIPLINARY HEART VALVE CLINIC                                       Cardiology Office Note    Date:  03/03/2018   ID:  Jerry Ferguson, DOB [birth year], MRN [id number]  PCP:  Esperanza Richters, PA-C  Cardiologist:  Kristeen Miss, MD / Dr. Excell Seltzer & Dr. Cornelius Moras (TAVR)  CC: TOC s/p TAVR  History of Present Illness:  Jerry Ferguson is a 73 y.o. 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   Patient's cardiac history dates back more than 5 years ago when he had 2 brief syncopal episodes. He underwent carotid duplex scan which revealed moderate bilateral 60-79%internal carotid artery stenosis. He was evaluated by Dr. Delton See and noted to have a heart murmur on exam. Echocardiogram revealed normal left ventricular systolic function with mild to moderate aortic stenosis with mean transvalvular gradient estimated 19 mmHg. He was referred for vascular surgical consultation and initially evaluated by Dr. Imogene Burn. Medical therapy with follow-up was recommended. He was lost to follow-up for several years but was referred back by his primary care physician for cardiology consultation and evaluated by Dr. Elease Hashimoto on November 11, 2017. At that time the patient reported mild symptoms of exertional shortness of breath and he denied any further episodes of dizziness or near syncope. Follow-up transthoracic echocardiogram performed November 25, 2017 revealed severe aortic stenosis with normal left ventricular systolic function. Peak velocity across aortic valve measured 4.8 m/s corresponding to mean transvalvular gradient estimated 50 mmHg. The DVI was 0.21 with aortic valve area calculated 0.81 cm. Initially continued observation was recommended. However, shortly after that the patient was seen by Dr. Chestine Spore for follow-up  of his bilateral carotid artery stenosis. Carotid duplex scan performed January 12, 2018 revealed high-grade 80-99%right internal carotid artery stenosis. There was 40 to 59% left internal carotid artery stenosis. Right carotid endarterectomy was recommended and the patient was referred backto Dr. Phineas Semen preoperative cardiac clearance. The patient was subsequently referred to the multidisciplinary heart valve clinic and underwent left and right heart catheterization by Dr. Excell Seltzer on February 04, 2018. Catheterization revealed multivessel coronary artery disease with severe diffuse high-grade multisegment stenosis of the right coronary artery and left to right collateral filling of the posterior descending coronary artery and right posterolateral branch. There was moderate ostial stenosis of the left circumflex coronary artery and nonobstructive disease in the left anterior descending coronary artery.  He underwent successful TAVR with a45mm Edwards Sapien 3 THV via the TF approach on 02/23/18. Post operative echoshowed EF 60%, normally functioning TAVR with mean gradient 9.26mm Hg and trivial PVL. He initially had a LBBB but this resolved prior to discharge. He was discharged on POD 1 on aspirin and plavix.   Today he presents to clinic for follow up. He is doing well but has a cold that has been bothering him. Otherwise, no CP or SOB. No LE edema, orthopnea or PND. No dizziness or syncope. No blood in stool or urine. No palpitations.    Past Medical History:  Diagnosis Date  . Abnormal liver function test   . Acute bronchitis   . Carotid artery occlusion    Bilateral Bruit  .  Cataract   . Coronary artery disease involving native coronary artery of native heart without angina pectoris   . DJD (degenerative joint disease)   . Dyspnea   . GERD (gastroesophageal reflux disease)   . Heart murmur   . History of UTI   . Hypercholesteremia   . Hypertension   . Lumbar back pain   .  Overweight(278.02)   . Pulmonary nodules    Multiple small pulmonary nodules scattered throughout both lungs, 12 month CT rec if pt high risk  . S/P TAVR (transcatheter aortic valve replacement)    26 mm Edwards Sapien 3 transcatheter heart valve placed via percutaneous right transfemoral approach   . Severe aortic stenosis   . Severe aortic stenosis     Past Surgical History:  Procedure Laterality Date  . APPENDECTOMY    . COLONOSCOPY    . CORONARY ANGIOGRAPHY N/A 02/04/2018   Procedure: CORONARY ANGIOGRAPHY;  Surgeon: Tonny Bollman, MD;  Location: [facility] INVASIVE CV LAB;  Service: Cardiovascular;  Laterality: N/A;  . RIGHT HEART CATH N/A 02/04/2018   Procedure: RIGHT HEART CATH;  Surgeon: Tonny Bollman, MD;  Location: [facility] INVASIVE CV LAB;  Service: Cardiovascular;  Laterality: N/A;  . TEE WITHOUT CARDIOVERSION N/A 02/23/2018   Procedure: TRANSESOPHAGEAL ECHOCARDIOGRAM (TEE);  Surgeon: Tonny Bollman, MD;  Location: [facility] INVASIVE CV LAB;  Service: Open Heart Surgery;  Laterality: N/A;  . TRANSCATHETER AORTIC VALVE REPLACEMENT, TRANSFEMORAL  02/23/2018  . TRANSCATHETER AORTIC VALVE REPLACEMENT, TRANSFEMORAL N/A 02/23/2018   Procedure: TRANSCATHETER AORTIC VALVE REPLACEMENT, TRANSFEMORAL;  Surgeon: Tonny Bollman, MD;  Location: [facility] INVASIVE CV LAB;  Service: Open Heart Surgery;  Laterality: N/A;    Current Medications: Outpatient Medications Prior to Visit  Medication Sig Dispense Refill  . amoxicillin (AMOXIL) 500 MG capsule Take 2000 mg by mouth 1 hour prior to dental visit    . aspirin EC 81 MG tablet Take 81 mg by mouth daily.    Marland Kitchen atorvastatin (LIPITOR) 20 MG tablet Take 1 tablet (20 mg total) by mouth daily. 90 tablet 0  . chlorhexidine (PERIDEX) 0.12 % solution Rince with 15 mls twice daily for 30 seconds. Use after breakfast and at bedtime. Spit out excess. Do not swallow.    . clopidogrel (PLAVIX) 75 MG tablet Take 1 tablet (75 mg total) by mouth daily with breakfast. 90 tablet 1    . HYDROcodone-acetaminophen (NORCO) 10-325 MG tablet Take 1 tablet by mouth every 6 (six) hours as needed. for pain    . lisinopril (PRINIVIL,ZESTRIL) 5 MG tablet Take 5 mg by mouth daily.    Marland Kitchen amoxicillin (AMOXIL) 500 MG capsule Take four capsules one hour before dental appointment. (Patient taking differently: Take 2,000 mg by mouth See admin instructions. Take 2000 mg by mouth 1 hour prior to dental appointment) 4 capsule 1  . chlorhexidine (PERIDEX) 0.12 % solution Rinse with 15 mls twice daily for 30 seconds. Use after breakfast and at bedtime. Spit out excess. Do not swallow. (Patient taking differently: Use as directed 15 mLs in the mouth or throat 2 (two) times daily. Rinse with 15 mls twice daily for 30 seconds. Use after breakfast and at bedtime. Spit out excess. Do not swallow.) 960 mL prn  . lisinopril (PRINIVIL,ZESTRIL) 5 MG tablet TAKE 1 TABLET BY MOUTH EVERY DAY (Patient taking differently: Take 5 mg by mouth daily. ) 90 tablet 1   No facility-administered medications prior to visit.      Allergies:   Patient has no known allergies.  Social History   Socioeconomic History  . Marital status: Widowed    Spouse name: stacey(deceased 2009/04/24)  . Number of children: 1  . Years of education: Not on file  . Highest education level: Not on file  Occupational History  . Occupation: welder  Social Needs  . Financial resource strain: Not on file  . Food insecurity:    Worry: Not on file    Inability: Not on file  . Transportation needs:    Medical: Not on file    Non-medical: Not on file  Tobacco Use  . Smoking status: Former Smoker    Packs/day: 1.00    Years: 15.00    Pack years: 15.00    Types: Cigarettes    Last attempt to quit: 01/06/1989    Years since quitting: 29.1  . Smokeless tobacco: Never Used  . Tobacco comment: chewed some in early 04-25-2022  Substance and Sexual Activity  . Alcohol use: No  . Drug use: No  . Sexual activity: Not on file  Lifestyle  . Physical  activity:    Days per week: Not on file    Minutes per session: Not on file  . Stress: Not on file  Relationships  . Social connections:    Talks on phone: Not on file    Gets together: Not on file    Attends religious service: Not on file    Active member of club or organization: Not on file    Attends meetings of clubs or organizations: Not on file    Relationship status: Not on file  Other Topics Concern  . Not on file  Social History Narrative   Uncle with prostate cancer?     Family History:  The patient's family history includes Arthritis (age of onset: 82) in his father; Cancer in his paternal uncle; Colon cancer in his father and sister; Healthy in his daughter and mother; Heart attack in his paternal grandfather; Heart disease in his paternal grandfather; Hypertension in his daughter; Other in his father; Prostate cancer in his brother.      ROS:   Please see the history of present illness.    ROS All other systems reviewed and are negative.   PHYSICAL EXAM:   VS:  BP 118/72   Pulse 72   Ht 5\' 11"  (1.803 m)   Wt 195 lb 1.9 oz (88.5 kg)   SpO2 98%   BMI 27.21 kg/m    GEN: Well nourished, well developed, in no acute distress HEENT: normal Neck: no JVD or masses Cardiac: RRR; soft flow murmur. No rubs, or gallops,no edema  Respiratory:  clear to auscultation bilaterally, normal work of breathing GI: soft, nontender, nondistended, + BS MS: no deformity or atrophy Skin: warm and dry, no rash. Groin sites with no hematoma or ecchymosis  Neuro:  Alert and Oriented x 3, Strength and sensation are intact Psych: euthymic mood, full affect   Wt Readings from Last 3 Encounters:  03/03/18 195 lb 1.9 oz (88.5 kg)  02/24/18 194 lb 8 oz (88.2 kg)  02/22/18 195 lb 11.2 oz (88.8 kg)      Studies/Labs Reviewed:   EKG:  EKG is ordered today.  The ekg ordered today demonstrates sinus with HR 67  Recent Labs: 02/22/2018: ALT 21; B Natriuretic Peptide 28.7 02/24/2018: BUN  12; Creatinine, Ser 0.98; Hemoglobin 12.0; Magnesium 2.0; Platelets 171; Potassium 3.7; Sodium 138   Lipid Panel    Component Value Date/Time   CHOL 114 10/13/2017 0752  TRIG 51.0 10/13/2017 0752   HDL 45.40 10/13/2017 0752   CHOLHDL 3 10/13/2017 0752   VLDL 10.2 10/13/2017 0752   LDLCALC 59 10/13/2017 0752   LDLDIRECT 95 11/04/2013 1038    Additional studies/ records that were reviewed today include:  TAVR OPERATIVE NOTE   Date of Procedure:02/23/2018  Preoperative Diagnosis:Severe Aortic Stenosis   Postoperative Diagnosis:Same  Procedure:   Transcatheter Aortic Valve Replacement - PercutaneousRightTransfemoral Approach Edwards Sapien 3 THV (size 26mm, model # 9600TFX, serial # [id number])  Co-Surgeons:Clarence H. Cornelius Moras, MD and Tonny Bollman, MD  Anesthesiologist:William Aleene Davidson, MD  Echocardiographer:Peter Eden Emms, MD  Pre-operative Echo Findings: ? Severe aortic stenosis ? Normalleft ventricular systolic function  Post-operative Echo Findings: ? Trivialparavalvular leak ? Normalleft ventricular systolic function  _____________   Echo 02/24/18 IMPRESSIONS  1. The left ventricle has normal systolic function with an ejection fraction of 60-65%. The cavity size was normal. There is severely increased left ventricular wall thickness. Left ventricular diastolic Doppler parameters are consistent with impaired  relaxation.  2. The right ventricle has normal systolic function. The cavity was normal. There is no increase in right ventricular wall thickness.  3. Left atrial size was mildly dilated.  4. The mitral valve is degenerative. Moderate thickening of the mitral valve leaflet. Moderate calcification of the mitral valve leaflet.  5. Nodular thickening of the anterior leaflet with narrow LVOT no gradient noted.  6. The tricuspid valve is normal in  structure.  7. The aortic valve is tricuspid Aortic valve regurgitation is trivial by color flow Doppler.  8. Post TAVR with 26 mm Sapien 3 valve trivial PVL.  9. The pulmonic valve was grossly normal. Pulmonic valve regurgitation is mild by color flow Doppler. 10. The aortic root is normal in size and structure.   ASSESSMENT & PLAN:   Severe AS s/p TAVR:doing excellent with no complaints. Groin sites look good. ECG with sinus and no HAVB. SBE prophylaxis discussed; he has amoxicillin. I will see him back in march for 1 month echo and follow up.   CAD: pre TAVR cath showed severe single vessel CAD with diffuse stenosis of the RCA.  No chest pain. Plan for medical therapy for now. Continue on statin and antiplatelet therapy.   HTN:Bp well controlled today.   Carotid artery disease: he has a known 80-99% RICA stenosis followed by Dr. Chestine Spore. Plan is for CEA at some point. He has an appointment with [facility] in April.   Pulmonary nodules: pre TAVR scans showed multiple small pulmonary nodules scattered throughout both lungs measuring 5 mm or less in size, nonspecific. Follow up CT in 12 months if pt is high risk ( he has ~30 pack year smoking history). I have discussed this finding with the patient. Will get this set up for 1 year at 1 month appointment.    Medication Adjustments/Labs and Tests Ordered: Current medicines are reviewed at length with the patient today.  Concerns regarding medicines are outlined above.  Medication changes, Labs and Tests ordered today are listed in the Patient Instructions below. Patient Instructions  Medication Instructions:  Your physician recommends that you continue on your current medications as directed. Please refer to the Current Medication list given to you today.  If you need a refill on your cardiac medications before your next appointment, please call your pharmacy.   Lab work: none If you have labs (blood work) drawn today and your tests are  completely normal, you will receive your results only by: Marland Kitchen MyChart Message (  if you have MyChart) OR . A paper copy in the mail If you have any lab test that is abnormal or we need to change your treatment, we will call you to review the results.  Testing/Procedures: none  Follow-Up: As scheduled.  Any Other Special Instructions Will Be Listed Below (If Applicable).       Signed, Cline Crock, PA-C  03/03/2018 3:49 PM    [facility] [address] [place], [place], [place]  [address] Phone: [phone]; Fax: [phone]

## 2018-03-03 ENCOUNTER — Other Ambulatory Visit: Payer: Self-pay | Admitting: Physician Assistant

## 2018-03-03 ENCOUNTER — Ambulatory Visit: Payer: PPO | Admitting: Physician Assistant

## 2018-03-03 ENCOUNTER — Other Ambulatory Visit: Payer: Self-pay | Admitting: *Deleted

## 2018-03-03 ENCOUNTER — Encounter: Payer: Self-pay | Admitting: Physician Assistant

## 2018-03-03 VITALS — BP 118/72 | HR 72 | Ht 71.0 in | Wt 195.1 lb

## 2018-03-03 DIAGNOSIS — Z952 Presence of prosthetic heart valve: Secondary | ICD-10-CM

## 2018-03-03 DIAGNOSIS — I6523 Occlusion and stenosis of bilateral carotid arteries: Secondary | ICD-10-CM

## 2018-03-03 DIAGNOSIS — I2510 Atherosclerotic heart disease of native coronary artery without angina pectoris: Secondary | ICD-10-CM

## 2018-03-03 DIAGNOSIS — R918 Other nonspecific abnormal finding of lung field: Secondary | ICD-10-CM | POA: Diagnosis not present

## 2018-03-03 DIAGNOSIS — I10 Essential (primary) hypertension: Secondary | ICD-10-CM | POA: Diagnosis not present

## 2018-03-03 NOTE — Patient Instructions (Signed)
Medication Instructions:  Your physician recommends that you continue on your current medications as directed. Please refer to the Current Medication list given to you today.  If you need a refill on your cardiac medications before your next appointment, please call your pharmacy.   Lab work: none If you have labs (blood work) drawn today and your tests are completely normal, you will receive your results only by: Marland Kitchen MyChart Message (if you have MyChart) OR . A paper copy in the mail If you have any lab test that is abnormal or we need to change your treatment, we will call you to review the results.  Testing/Procedures: none  Follow-Up: As scheduled.  Any Other Special Instructions Will Be Listed Below (If Applicable).

## 2018-03-11 DIAGNOSIS — R972 Elevated prostate specific antigen [PSA]: Secondary | ICD-10-CM | POA: Diagnosis not present

## 2018-03-12 ENCOUNTER — Encounter: Payer: Self-pay | Admitting: Thoracic Surgery (Cardiothoracic Vascular Surgery)

## 2018-03-23 ENCOUNTER — Telehealth (INDEPENDENT_AMBULATORY_CARE_PROVIDER_SITE_OTHER): Payer: PPO | Admitting: Cardiology

## 2018-03-23 DIAGNOSIS — I350 Nonrheumatic aortic (valve) stenosis: Secondary | ICD-10-CM

## 2018-03-23 DIAGNOSIS — Z952 Presence of prosthetic heart valve: Secondary | ICD-10-CM | POA: Diagnosis not present

## 2018-03-23 NOTE — Telephone Encounter (Signed)
Patient called today to reschedule both 2D echo and TAVR follow-up with Carlean Jews, PA.  Due to the coronavirus pandemic and patient's advanced age as well as comorbidities he is at increased risk.  The patient states he is doing fine and is okay with rescheduling appointments.  We will call him at a later date to reschedule echo and follow-up with Carlean Jews, PA.

## 2018-03-24 ENCOUNTER — Ambulatory Visit: Payer: PPO | Admitting: Physician Assistant

## 2018-03-24 ENCOUNTER — Other Ambulatory Visit (HOSPITAL_COMMUNITY): Payer: Self-pay

## 2018-03-25 ENCOUNTER — Other Ambulatory Visit: Payer: Self-pay | Admitting: Physician Assistant

## 2018-03-25 ENCOUNTER — Encounter: Payer: Self-pay | Admitting: Physician Assistant

## 2018-03-25 DIAGNOSIS — R918 Other nonspecific abnormal finding of lung field: Secondary | ICD-10-CM

## 2018-03-25 DIAGNOSIS — Z952 Presence of prosthetic heart valve: Secondary | ICD-10-CM

## 2018-03-25 NOTE — Telephone Encounter (Addendum)
  [facility]   MULTIDISCIPLINARY HEART VALVE TEAM  Patient contacted about 1 month TAVR follow up. Given Covid-19 pandemic we have asked the patient to not come into the office for appointment to limit exposure. The patient consented to do consult over the phone. The patient, Jerry Ferguson, and Carlean Jews PA-C were present during the telephone encounter.   Chief Complaint: 1 month s/p TAVR.   HPI: Jerry Ferguson is a 73 y.o. 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   Patient is currently doing well with no new symptoms. No CP or SOB. No LE edema, orthopnea or PND. No dizziness or syncope. No blood in stool or urine. No palpitations.   KCCQ completed over the phone.   Assessment and Plan:   Patient has NYHA class I symptoms. The patient has been instructed to stop taking his plavix after 6 months of therapy (06/2018). Continue on Aspirin indefinitely.   One month echo rescheduled to 05/04/18. He has an appointment with Dr. Elease Hashimoto 05/25/18. 1 year echo and appt scheduled with me on 02/23/19. A follow up CT chest has also been scheduled 02/23/19 to follow up on incidental pulmonary nodules found on pre TAVR CT scan.   Diagnosis: s/p TAVR, ICD Z95.2. Total time spent ~15 minutes   Cline Crock PA-C [facility]

## 2018-04-04 ENCOUNTER — Other Ambulatory Visit: Payer: Self-pay | Admitting: Medical

## 2018-04-04 DIAGNOSIS — R0989 Other specified symptoms and signs involving the circulatory and respiratory systems: Secondary | ICD-10-CM

## 2018-04-04 DIAGNOSIS — I779 Disorder of arteries and arterioles, unspecified: Secondary | ICD-10-CM

## 2018-04-04 DIAGNOSIS — E785 Hyperlipidemia, unspecified: Secondary | ICD-10-CM

## 2018-04-04 DIAGNOSIS — I350 Nonrheumatic aortic (valve) stenosis: Secondary | ICD-10-CM

## 2018-04-04 DIAGNOSIS — I739 Peripheral vascular disease, unspecified: Principal | ICD-10-CM

## 2018-04-04 DIAGNOSIS — I10 Essential (primary) hypertension: Secondary | ICD-10-CM

## 2018-04-05 NOTE — Telephone Encounter (Signed)
Pt had telephone visit completed and f/u rescheduled, will remove from C19 pool

## 2018-04-09 ENCOUNTER — Encounter: Payer: Self-pay | Admitting: Thoracic Surgery (Cardiothoracic Vascular Surgery)

## 2018-05-03 ENCOUNTER — Other Ambulatory Visit (HOSPITAL_COMMUNITY): Payer: Self-pay

## 2018-05-04 ENCOUNTER — Other Ambulatory Visit: Payer: Self-pay

## 2018-05-04 ENCOUNTER — Other Ambulatory Visit (HOSPITAL_COMMUNITY): Payer: Self-pay

## 2018-05-04 ENCOUNTER — Encounter: Payer: Self-pay | Admitting: Vascular Surgery

## 2018-05-04 ENCOUNTER — Ambulatory Visit (INDEPENDENT_AMBULATORY_CARE_PROVIDER_SITE_OTHER): Payer: PPO | Admitting: Vascular Surgery

## 2018-05-04 VITALS — BP 125/69 | HR 63 | Temp 97.6°F | Resp 16 | Ht 71.0 in | Wt 199.0 lb

## 2018-05-04 DIAGNOSIS — I6523 Occlusion and stenosis of bilateral carotid arteries: Secondary | ICD-10-CM | POA: Diagnosis not present

## 2018-05-04 NOTE — Progress Notes (Signed)
Patient name: Jerry Ferguson MRN: [id number] DOB: [birth year] Sex: male  REASON FOR CONSULT: F/U to discuss carotid surgery  HPI: Jerry Ferguson is a 73 y.o. male, with multiple medical problems including hx severe aortic stenosis now s/p TAVR, hypertension, hyperlipidemia as well as known carotid disease that presents to discuss carotid surgery after TAVR.  He was last seen by me on 01/12/18 and his right sided carotid disease had advanced to >80% stenosis.  His lesion was asymptomatic and he reported no history of TIA or stroke.  Ultimately was referred to cardiology and then to CT surgery and underwent TAVR without any complications.  I spoke with Dr. Barry Dienes who said he can have whatever surgery he needs from our standpoint.  On follow-up today he again states no neurologic events including TIAs or strokes etc.  Past Medical History:  Diagnosis Date  . Abnormal liver function test   . Acute bronchitis   . Carotid artery occlusion    Bilateral Bruit  . Cataract   . Coronary artery disease involving native coronary artery of native heart without angina pectoris   . DJD (degenerative joint disease)   . Dyspnea   . GERD (gastroesophageal reflux disease)   . Heart murmur   . History of UTI   . Hypercholesteremia   . Hypertension   . Lumbar back pain   . Overweight(278.02)   . Pulmonary nodules    Multiple small pulmonary nodules scattered throughout both lungs, 12 month CT rec if pt high risk  . S/P TAVR (transcatheter aortic valve replacement)    26 mm Edwards Sapien 3 transcatheter heart valve placed via percutaneous right transfemoral approach   . Severe aortic stenosis   . Severe aortic stenosis     Past Surgical History:  Procedure Laterality Date  . APPENDECTOMY    . COLONOSCOPY    . CORONARY ANGIOGRAPHY N/A 02/04/2018   Procedure: CORONARY ANGIOGRAPHY;  Surgeon: Tonny Bollman, MD;  Location: [facility] INVASIVE CV LAB;  Service: Cardiovascular;  Laterality: N/A;  . RIGHT HEART  CATH N/A 02/04/2018   Procedure: RIGHT HEART CATH;  Surgeon: Tonny Bollman, MD;  Location: [facility] INVASIVE CV LAB;  Service: Cardiovascular;  Laterality: N/A;  . TEE WITHOUT CARDIOVERSION N/A 02/23/2018   Procedure: TRANSESOPHAGEAL ECHOCARDIOGRAM (TEE);  Surgeon: Tonny Bollman, MD;  Location: [facility] INVASIVE CV LAB;  Service: Open Heart Surgery;  Laterality: N/A;  . TRANSCATHETER AORTIC VALVE REPLACEMENT, TRANSFEMORAL  02/23/2018  . TRANSCATHETER AORTIC VALVE REPLACEMENT, TRANSFEMORAL N/A 02/23/2018   Procedure: TRANSCATHETER AORTIC VALVE REPLACEMENT, TRANSFEMORAL;  Surgeon: Tonny Bollman, MD;  Location: [facility] INVASIVE CV LAB;  Service: Open Heart Surgery;  Laterality: N/A;    Family History  Problem Relation Age of Onset  . Colon cancer Father        metastatic  . Other Father        DJD  . Arthritis Father 18  . Healthy Mother        Living-87  . Prostate cancer Brother   . Heart attack Paternal Grandfather   . Heart disease Paternal Grandfather   . Cancer Paternal Uncle   . Colon cancer Sister   . Hypertension Daughter        x1  . Healthy Daughter        x1  . Esophageal cancer Neg Hx   . Stomach cancer Neg Hx   . Rectal cancer Neg Hx     SOCIAL HISTORY: Social History   Socioeconomic History  .  Marital status: Widowed    Spouse name: stacey(deceased 2011)  . Number of children: 1  . Years of education: Not on file  . Highest education level: Not on file  Occupational History  . Occupation: welder  Social Needs  . Financial resource strain: Not on file  . Food insecurity:    Worry: Not on file    Inability: Not on file  . Transportation needs:    Medical: Not on file    Non-medical: Not on file  Tobacco Use  . Smoking status: Former Smoker    Packs/day: 1.00    Years: 15.00    Pack years: 15.00    Types: Cigarettes    Last attempt to quit: 01/06/1989    Years since quitting: 29.3  . Smokeless tobacco: Never Used  . Tobacco comment: chewed some in early 20's   Substance and Sexual Activity  . Alcohol use: No  . Drug use: No  . Sexual activity: Not on file  Lifestyle  . Physical activity:    Days per week: Not on file    Minutes per session: Not on file  . Stress: Not on file  Relationships  . Social connections:    Talks on phone: Not on file    Gets together: Not on file    Attends religious service: Not on file    Active member of club or organization: Not on file    Attends meetings of clubs or organizations: Not on file    Relationship status: Not on file  . Intimate partner violence:    Fear of current or ex partner: Not on file    Emotionally abused: Not on file    Physically abused: Not on file    Forced sexual activity: Not on file  Other Topics Concern  . Not on file  Social History Narrative   Uncle with prostate cancer?    No Known Allergies  Current Outpatient Medications  Medication Sig Dispense Refill  . aspirin EC 81 MG tablet Take 81 mg by mouth daily.    Marland Kitchen. atorvastatin (LIPITOR) 20 MG tablet TAKE 1 TABLET BY MOUTH EVERY DAY 90 tablet 0  . clopidogrel (PLAVIX) 75 MG tablet Take 1 tablet (75 mg total) by mouth daily with breakfast. 90 tablet 1  . lisinopril (PRINIVIL,ZESTRIL) 5 MG tablet Take 5 mg by mouth daily.    Marland Kitchen. amoxicillin (AMOXIL) 500 MG capsule Take 2000 mg by mouth 1 hour prior to dental visit    . chlorhexidine (PERIDEX) 0.12 % solution Rince with 15 mls twice daily for 30 seconds. Use after breakfast and at bedtime. Spit out excess. Do not swallow.    Marland Kitchen. HYDROcodone-acetaminophen (NORCO) 10-325 MG tablet Take 1 tablet by mouth every 6 (six) hours as needed. for pain     No current facility-administered medications for this visit.     REVIEW OF SYSTEMS:  [X]  denotes positive finding, [ ]  denotes negative finding Cardiac  Comments:  Chest pain or chest pressure:    Shortness of breath upon exertion:    Short of breath when lying flat:    Irregular heart rhythm:        Vascular    Pain in calf,  thigh, or hip brought on by ambulation:    Pain in feet at night that wakes you up from your sleep:     Blood clot in your veins:    Leg swelling:         Pulmonary  Oxygen at home:    Productive cough:     Wheezing:         Neurologic    Sudden weakness in arms or legs:     Sudden numbness in arms or legs:     Sudden onset of difficulty speaking or slurred speech:    Temporary loss of vision in one eye:     Problems with dizziness:         Gastrointestinal    Blood in stool:     Vomited blood:         Genitourinary    Burning when urinating:     Blood in urine:        Psychiatric    Major depression:         Hematologic    Bleeding problems:    Problems with blood clotting too easily:        Skin    Rashes or ulcers:        Constitutional    Fever or chills:      PHYSICAL EXAM: Vitals:   05/04/18 1133 05/04/18 1136  BP: 138/75 125/69  Pulse: 63 63  Resp: 16   Temp: 97.6 F (36.4 C)   TempSrc: Oral   SpO2: 100%   Weight: 199 lb (90.3 kg)   Height: 5\' 11"  (1.803 m)     GENERAL: The patient is a well-nourished male, in no acute distress. The vital signs are documented above. CARDIAC: There is a regular rate and rhythm.  VASCULAR:  Right carotid bruit audible No left carotid bruit 2+ bilateral radial pulses palpable 2+ bilateral femoral pulses palpable PULMONARY: There is good air exchange bilaterally without wheezing or rales. ABDOMEN: Soft and non-tender with normal pitched bowel sounds.  MUSCULOSKELETAL: There are no major deformities or cyanosis. NEUROLOGIC: No focal weakness or paresthesias are detected.  CN II-XII grossly intact.   DATA:   I independently reviewed his carotid duplex that shows a high-grade greater than 80% stenosis of the right ICA with a velocity 401/159.  Left ICA with 40 to 59% stenosis.  Assessment/Plan:  73 year old male with high-grade asymptomatic right ICA stenosis now status post TAVR for severe aortic stenosis.   Discussed plan to proceed with right carotid endarterectomy for stroke prevention.  He has done very well since his TAVR and appreciate cardiology, CT surgery input.  Patient would like to wait until June given the coronavirus which I think is very reasonable and we have similar restrictions at the hospital.  We will look ahead into June to schedule him for right carotid endarterectomy and asked him to call with any questions or concerns.  I did go over the risks and benefits in detail including 1% risk of stroke from the operation.   Cephus Shelling, MD [facility] [place] [place] Office: [phone] Pager: [phone]   Cephus Shelling

## 2018-05-14 ENCOUNTER — Telehealth: Payer: Self-pay

## 2018-05-14 NOTE — Telephone Encounter (Signed)
Pt has given verbal consent for a phone visit for his appt. Pt does not have the capability to do his BP, HR, or weight at home.    YOUR CARDIOLOGY TEAM HAS ARRANGED FOR AN E-VISIT FOR YOUR APPOINTMENT - PLEASE REVIEW IMPORTANT INFORMATION BELOW SEVERAL DAYS PRIOR TO YOUR APPOINTMENT  Due to the recent COVID-19 pandemic, we are transitioning in-person office visits to tele-medicine visits in an effort to decrease unnecessary exposure to our patients and staff. Medicare and most insurances are covering these visits without a copay needed. You will need a working email and a smartphone or computer with a camera and microphone. For patients that do not have these items, we can still complete the visit using a telephone but do prefer video when possible. If possible, we also ask that you have a blood pressure cuff and scale at home to measure your blood pressure, heart rate and weight prior to your scheduled appointment. Patients with clinical needs that need an in-person evaluation and testing will still be able to come to the office if absolutely necessary. If you have any questions, feel free to call our office.     DOWNLOADING THE SOFTWARE  Download the [employer] app to enable video and telephone visits with your [facility] Provider.   Instructions for downloading [employer] WebEx: - Go to https://www.webex.com/downloads.html and follow the instructions, or download the app on your smartphone [facility] [employer] Meetings). - If you have technical difficulties with downloading WebEx, please call WebEx at [phone]. - Once the app is downloaded (can be done on either mobile or desktop computer), go to Settings in the upper left hand corner.  Be sure that camera and audio are enabled.  - You will receive an email message with a link to the meeting with a time to join for your tele-health visit.  - Please download the app and have settings configured prior to the appointment time.      2-3 DAYS  BEFORE YOUR APPOINTMENT  One of our staff will call you to confirm that you have been able to set up your WebEx account. We will remind you check your blood pressure, heart rate and weight prior to your scheduled appointment. If you have an Apple Watch or Kardia, please upload any pertinent ECG strips the day before or morning of your appointment to MyChart. Our staff will also make sure you have reviewed the consent and agree to move forward with your scheduled tele-health visit.    THE DAY OF YOUR APPOINTMENT  Approximately 15-20 minutes prior to your scheduled appointment, you will receive an e-mail directly from one of our staff member's [email or web address] e-mail accounts inviting you to join a WebEx meeting.  Please do not reply to that email - simply join the [employer].  Upon joining, a member of the office staff will speak with you initially through the WebEx platform to confirm medications, vital signs for the day and any symptoms you may be experiencing.  Please have this information available prior to the time of visit start.      CONSENT FOR TELE-HEALTH VISIT - PLEASE RVIEW  I hereby voluntarily request, consent and authorize [facility] and its employed or contracted physicians, physician assistants, nurse practitioners or other licensed health care professionals (the Practitioner), to provide me with telemedicine health care services (the "Services") as deemed necessary by the treating Practitioner. I acknowledge and consent to receive the Services by the Practitioner via telemedicine. I understand that the telemedicine visit will  involve communicating with the Practitioner through live audiovisual communication technology and the disclosure of certain medical information by electronic transmission. I acknowledge that I have been given the opportunity to request an in-person assessment or other available alternative prior to the telemedicine visit and am voluntarily participating in  the telemedicine visit.  I understand that I have the right to withhold or withdraw my consent to the use of telemedicine in the course of my care at any time, without affecting my right to future care or treatment, and that the Practitioner or I may terminate the telemedicine visit at any time. I understand that I have the right to inspect all information obtained and/or recorded in the course of the telemedicine visit and may receive copies of available information for a reasonable fee.  I understand that some of the potential risks of receiving the Services via telemedicine include:  Marland Kitchen. Delay or interruption in medical evaluation due to technological equipment failure or disruption; . Information transmitted may not be sufficient (e.g. poor resolution of images) to allow for appropriate medical decision making by the Practitioner; and/or  . In rare instances, security protocols could fail, causing a breach of personal health information.  Furthermore, I acknowledge that it is my responsibility to provide information about my medical history, conditions and care that is complete and accurate to the best of my ability. I acknowledge that Practitioner's advice, recommendations, and/or decision may be based on factors not within their control, such as incomplete or inaccurate data provided by me or distortions of diagnostic images or specimens that may result from electronic transmissions. I understand that the practice of medicine is not an exact science and that Practitioner makes no warranties or guarantees regarding treatment outcomes. I acknowledge that I will receive a copy of this consent concurrently upon execution via email to the email address I last provided but may also request a printed copy by calling the office of [facility].    I understand that my insurance will be billed for this visit.   I have read or had this consent read to me. . I understand the contents of this consent, which  adequately explains the benefits and risks of the Services being provided via telemedicine.  . I have been provided ample opportunity to ask questions regarding this consent and the Services and have had my questions answered to my satisfaction. . I give my informed consent for the services to be provided through the use of telemedicine in my medical care  By participating in this telemedicine visit I agree to the above.

## 2018-05-17 ENCOUNTER — Other Ambulatory Visit (HOSPITAL_COMMUNITY): Payer: PPO

## 2018-05-25 ENCOUNTER — Other Ambulatory Visit: Payer: Self-pay

## 2018-05-25 ENCOUNTER — Encounter: Payer: Self-pay | Admitting: Cardiovascular Disease

## 2018-05-25 ENCOUNTER — Telehealth (INDEPENDENT_AMBULATORY_CARE_PROVIDER_SITE_OTHER): Payer: PPO | Admitting: Cardiovascular Disease

## 2018-05-25 VITALS — Ht 71.0 in

## 2018-05-25 DIAGNOSIS — I6523 Occlusion and stenosis of bilateral carotid arteries: Secondary | ICD-10-CM

## 2018-05-25 DIAGNOSIS — I10 Essential (primary) hypertension: Secondary | ICD-10-CM | POA: Diagnosis not present

## 2018-05-25 DIAGNOSIS — E782 Mixed hyperlipidemia: Secondary | ICD-10-CM | POA: Diagnosis not present

## 2018-05-25 DIAGNOSIS — Z952 Presence of prosthetic heart valve: Secondary | ICD-10-CM | POA: Diagnosis not present

## 2018-05-25 DIAGNOSIS — I2510 Atherosclerotic heart disease of native coronary artery without angina pectoris: Secondary | ICD-10-CM | POA: Diagnosis not present

## 2018-05-25 NOTE — Progress Notes (Signed)
Virtual Visit via Telephone Note   This visit type was conducted due to national recommendations for restrictions regarding the COVID-19 Pandemic (e.g. social distancing) in an effort to limit this patient's exposure and mitigate transmission in our community.  Due to his co-morbid illnesses, this patient is at least at moderate risk for complications without adequate follow up.  This format is felt to be most appropriate for this patient at this time.  The patient did not have access to video technology/had technical difficulties with video requiring transitioning to audio format only (telephone).  All issues noted in this document were discussed and addressed.  No physical exam could be performed with this format.  Please refer to the patient's chart for his  consent to telehealth for [facility].   Date:  05/25/2018   ID:  Jerry Ferguson, DOB 02/10/1945, MRN [id number]  Patient Location: Home Provider Location: Home  PCP:  Marisue BrooklynSaguier, Edward, PA-C  Cardiologist:  Kristeen MissPhilip , MD  Electrophysiologist:  None   Evaluation Performed:  Follow-Up Visit  Chief Complaint  Patient presents with  . Aortic Stenosis    Problem list 1.  Aortic stenosis - s/p TAVR  2.  Coronary artery disease 3.  Both carotid artery disease 4.  Hyperlipidemia 5.   Essential Hypertension    Nov. 6, 2019   Jerry LeanCarl T Obeirne is a 73 y.o. male with a hx of  HTN, HLD and aorticstenosis.  We were asked to see him by Esperanza RichtersSaguier, Edward, PA-C for further evaluation of his aortic stenosis.    His last echocardiogram was performed June, 2015.  At that time, he had normal left ventricular systolic function.  He had moderate aortic stenosis. The mean aortic valve gradient was 23 mmHg with a peak aortic valve gradient of 41 mmHg. Mildly dilated ascending aorta.  He feels quite well Has mild DOE Mows his lawn without any problem  No CP , no syncope  No cough,  No weight gain Has lost some weight - has  been traveling lots     Chief Complaint:    S/p TAVR   History of Present Illness:    Jerry LeanCarl T Blane is a 73 y.o. male with CAD and AS .  He had successful TAVR with a 26 mm Edwards 30 valve lesion, 2020.  Postop echo revealed an ejection fraction of 60% with a normal exam.  Heart catheterization had revealed multivessel coronary disease with this severe diffuse high-grade stenosis of his right coronary artery with left-to-right feeling of his posterior descending artery.  There was moderate stenosis of the left circumflex artery and nonobstructive disease in the LAD.  No CP, no dyspnea Having some sinus issues Breathing and endurance have improved following TAVR  Saw Dr. Chestine Sporelark,   Plans for carotid surgery in June .    Does not always wear a mask when he goes  The patient does not have symptoms concerning for COVID-19 infection (fever, chills, cough, or new shortness of breath).    Past Medical History:  Diagnosis Date  . Abnormal liver function test   . Acute bronchitis   . Carotid artery occlusion    Bilateral Bruit  . Cataract   . Coronary artery disease involving native coronary artery of native heart without angina pectoris   . DJD (degenerative joint disease)   . Dyspnea   . GERD (gastroesophageal reflux disease)   . Heart murmur   . History of UTI   . Hypercholesteremia   . Hypertension   .  Lumbar back pain   . Overweight(278.02)   . Pulmonary nodules    Multiple small pulmonary nodules scattered throughout both lungs, 12 month CT rec if pt high risk  . S/P TAVR (transcatheter aortic valve replacement)    26 mm Edwards Sapien 3 transcatheter heart valve placed via percutaneous right transfemoral approach   . Severe aortic stenosis   . Severe aortic stenosis    Past Surgical History:  Procedure Laterality Date  . APPENDECTOMY    . COLONOSCOPY    . CORONARY ANGIOGRAPHY N/A 02/04/2018   Procedure: CORONARY ANGIOGRAPHY;  Surgeon: Tonny Bollman, MD;  Location:  [facility] INVASIVE CV LAB;  Service: Cardiovascular;  Laterality: N/A;  . RIGHT HEART CATH N/A 02/04/2018   Procedure: RIGHT HEART CATH;  Surgeon: Tonny Bollman, MD;  Location: [facility] INVASIVE CV LAB;  Service: Cardiovascular;  Laterality: N/A;  . TEE WITHOUT CARDIOVERSION N/A 02/23/2018   Procedure: TRANSESOPHAGEAL ECHOCARDIOGRAM (TEE);  Surgeon: Tonny Bollman, MD;  Location: [facility] INVASIVE CV LAB;  Service: Open Heart Surgery;  Laterality: N/A;  . TRANSCATHETER AORTIC VALVE REPLACEMENT, TRANSFEMORAL  02/23/2018  . TRANSCATHETER AORTIC VALVE REPLACEMENT, TRANSFEMORAL N/A 02/23/2018   Procedure: TRANSCATHETER AORTIC VALVE REPLACEMENT, TRANSFEMORAL;  Surgeon: Tonny Bollman, MD;  Location: [facility] INVASIVE CV LAB;  Service: Open Heart Surgery;  Laterality: N/A;     Current Meds  Medication Sig  . aspirin EC 81 MG tablet Take 81 mg by mouth daily.  Marland Kitchen atorvastatin (LIPITOR) 20 MG tablet TAKE 1 TABLET BY MOUTH EVERY DAY  . clopidogrel (PLAVIX) 75 MG tablet Take 1 tablet (75 mg total) by mouth daily with breakfast.  . fluticasone (FLONASE) 50 MCG/ACT nasal spray Place 1 spray into both nostrils as needed for allergies or rhinitis.  Marland Kitchen lisinopril (PRINIVIL,ZESTRIL) 5 MG tablet Take 5 mg by mouth daily.     Allergies:   Patient has no known allergies.   Social History   Tobacco Use  . Smoking status: Former Smoker    Packs/day: 1.00    Years: 15.00    Pack years: 15.00    Types: Cigarettes    Last attempt to quit: 01/06/1989    Years since quitting: 29.4  . Smokeless tobacco: Never Used  . Tobacco comment: chewed some in early 20's  Substance Use Topics  . Alcohol use: No  . Drug use: No     Family Hx: The patient's family history includes Arthritis (age of onset: 73) in his father; Cancer in his paternal uncle; Colon cancer in his father and sister; Healthy in his daughter and mother; Heart attack in his paternal grandfather; Heart disease in his paternal grandfather; Hypertension in his daughter; Other  in his father; Prostate cancer in his brother. There is no history of Esophageal cancer, Stomach cancer, or Rectal cancer.  ROS:   Please see the history of present illness.     All other systems reviewed and are negative.   Prior CV studies:   The following studies were reviewed today:    Labs/Other Tests and Data Reviewed:    EKG:  No ECG reviewed.  Recent Labs: 02/22/2018: ALT 21; B Natriuretic Peptide 28.7 02/24/2018: BUN 12; Creatinine, Ser 0.98; Hemoglobin 12.0; Magnesium 2.0; Platelets 171; Potassium 3.7; Sodium 138   Recent Lipid Panel Lab Results  Component Value Date/Time   CHOL 114 10/13/2017 07:52 AM   TRIG 51.0 10/13/2017 07:52 AM   HDL 45.40 10/13/2017 07:52 AM   CHOLHDL 3 10/13/2017 07:52 AM   LDLCALC 59 10/13/2017 07:52 AM  LDLDIRECT 95 11/04/2013 10:38 AM    Wt Readings from Last 3 Encounters:  05/04/18 199 lb (90.3 kg)  03/03/18 195 lb 1.9 oz (88.5 kg)  02/24/18 194 lb 8 oz (88.2 kg)     Objective:    Vital Signs:  Ht  (1.803 m)   BMI 27.75 kg/m      ASSESSMENT & PLAN:    1. Aortic stenosis:   S/p TAVR.  Is on ASA 81 mg a day and Plavix 75 mg a day.  The plan will be for 6 months of ASA and plavix and then I would like for him to continue plavix long term ( severe coronary disease and carotid artery disease.  His TAVR appears to be working well   2. CAD :   No angina .   As above,  I think he would benefit from long term plavix.  He has severe diffuse disease of his RCA as well as ostial disease of the LCX.  3. Carotid artery disease:   Plan is for CEA with Dr. Chestine Spore in the next month or so  He is doing well and should be at low risk for this carotid procedure.  He will be able to hold his plavix for 5 days prior to the carotid surgery .   4.  Hyperlipidemia:   Lipids in oct., 2019 look good.   Will recheck at his next visit   5.  HTN:   Continue lisinopril.   No BP available today .       COVID-19 Education: The signs and symptoms  of COVID-19 were discussed with the patient and how to seek care for testing (follow up with PCP or arrange E-visit).  The importance of social distancing was discussed today.  Time:   Today, I have spent  20  minutes with the patient with telehealth technology discussing the above problems.     Medication Adjustments/Labs and Tests Ordered: Current medicines are reviewed at length with the patient today.  Concerns regarding medicines are outlined above.   Tests Ordered: No orders of the defined types were placed in this encounter.   Medication Changes: No orders of the defined types were placed in this encounter.   Disposition:  Follow up in 3 month(s)  Signed, Kristeen Miss, MD  05/25/2018 9:01 AM    [facility]

## 2018-05-25 NOTE — Patient Instructions (Addendum)
Medication Instructions:  Your physician has recommended you make the following change in your medication:  STOP Aspirin in August (6 months after your TAVR) Continue Plavix indefinitely  If you need a refill on your cardiac medications before your next appointment, please call your pharmacy.    Lab work: Your physician recommends that you return for lab work in: 6 months on the day of or a few days before your office visit with Dr. Elease Hashimoto.  You will need to FAST for this appointment - nothing to eat or drink after midnight the night before except water.    Testing/Procedures: None Ordered   Follow-Up: At [facility], you and your health needs are our priority.  As part of our continuing mission to provide you with exceptional heart care, we have created designated Provider Care Teams.  These Care Teams include your primary Cardiologist (physician) and Advanced Practice Providers (APPs -  Physician Assistants and Nurse Practitioners) who all work together to provide you with the care you need, when you need it. You will need a follow up appointment in:  6 months.  Please call our office 2 months in advance to schedule this appointment.  You may see Kristeen Miss, MD or one of the following Advanced Practice Providers on your designated Care Team: Tereso Newcomer, PA-C [place], [place] . Berton Bon, NP

## 2018-05-25 NOTE — Addendum Note (Signed)
Addended by: Levi Aland on: 05/25/2018 10:29 AM   Modules accepted: Orders

## 2018-05-27 MED ORDER — CLOPIDOGREL BISULFATE 75 MG PO TABS: 75.0000 mg | ORAL_TABLET | Freq: Every day | ORAL | 3 refills | Status: DC

## 2018-05-27 NOTE — Addendum Note (Signed)
Addended by: Levi Aland on: 05/27/2018 10:45 AM   Modules accepted: Orders

## 2018-06-24 ENCOUNTER — Encounter (HOSPITAL_COMMUNITY): Payer: Self-pay | Admitting: Physician Assistant

## 2018-06-29 ENCOUNTER — Other Ambulatory Visit: Payer: Self-pay | Admitting: Medical

## 2018-06-29 DIAGNOSIS — R0989 Other specified symptoms and signs involving the circulatory and respiratory systems: Secondary | ICD-10-CM

## 2018-06-29 DIAGNOSIS — E785 Hyperlipidemia, unspecified: Secondary | ICD-10-CM

## 2018-06-29 DIAGNOSIS — I779 Disorder of arteries and arterioles, unspecified: Secondary | ICD-10-CM

## 2018-06-29 DIAGNOSIS — I350 Nonrheumatic aortic (valve) stenosis: Secondary | ICD-10-CM

## 2018-06-29 DIAGNOSIS — I10 Essential (primary) hypertension: Secondary | ICD-10-CM

## 2018-07-14 ENCOUNTER — Other Ambulatory Visit (HOSPITAL_COMMUNITY): Payer: PPO

## 2018-07-23 ENCOUNTER — Other Ambulatory Visit (HOSPITAL_COMMUNITY): Payer: PPO

## 2018-07-28 ENCOUNTER — Encounter: Payer: Self-pay | Admitting: Gastroenterology

## 2018-08-11 ENCOUNTER — Other Ambulatory Visit: Payer: Self-pay

## 2018-08-11 ENCOUNTER — Encounter (INDEPENDENT_AMBULATORY_CARE_PROVIDER_SITE_OTHER): Payer: Self-pay

## 2018-08-11 ENCOUNTER — Ambulatory Visit (HOSPITAL_COMMUNITY): Payer: PPO | Attending: Internal Medicine

## 2018-08-11 DIAGNOSIS — Z952 Presence of prosthetic heart valve: Secondary | ICD-10-CM

## 2018-08-13 ENCOUNTER — Other Ambulatory Visit: Payer: Self-pay | Admitting: Physician Assistant

## 2018-08-30 ENCOUNTER — Telehealth: Payer: Self-pay

## 2018-08-30 NOTE — Telephone Encounter (Signed)
Left message for pt to call the office regarding setting him up for a procedure with Dr Carlis Abbott for Jerry Ferguson, [place]

## 2018-09-25 ENCOUNTER — Other Ambulatory Visit: Payer: Self-pay | Admitting: Medical

## 2018-09-25 DIAGNOSIS — I10 Essential (primary) hypertension: Secondary | ICD-10-CM

## 2018-09-25 DIAGNOSIS — I350 Nonrheumatic aortic (valve) stenosis: Secondary | ICD-10-CM

## 2018-09-25 DIAGNOSIS — E785 Hyperlipidemia, unspecified: Secondary | ICD-10-CM

## 2018-09-25 DIAGNOSIS — I779 Disorder of arteries and arterioles, unspecified: Secondary | ICD-10-CM

## 2018-09-25 DIAGNOSIS — R0989 Other specified symptoms and signs involving the circulatory and respiratory systems: Secondary | ICD-10-CM

## 2018-12-15 ENCOUNTER — Encounter: Payer: Self-pay | Admitting: Medical

## 2018-12-15 ENCOUNTER — Other Ambulatory Visit: Payer: Self-pay

## 2018-12-15 ENCOUNTER — Ambulatory Visit (HOSPITAL_BASED_OUTPATIENT_CLINIC_OR_DEPARTMENT_OTHER)
Admission: RE | Admit: 2018-12-15 | Discharge: 2018-12-15 | Disposition: A | Discharge status: Home / Self Care | Payer: PPO | Source: Ambulatory Visit | Attending: Medical | Admitting: Medical

## 2018-12-15 ENCOUNTER — Ambulatory Visit (INDEPENDENT_AMBULATORY_CARE_PROVIDER_SITE_OTHER): Payer: PPO | Admitting: Medical

## 2018-12-15 VITALS — BP 128/68 | HR 78 | Temp 96.9°F | Resp 18 | Ht 71.0 in | Wt 206.0 lb

## 2018-12-15 DIAGNOSIS — R0600 Dyspnea, unspecified: Secondary | ICD-10-CM | POA: Diagnosis not present

## 2018-12-15 DIAGNOSIS — Z87891 Personal history of nicotine dependence: Secondary | ICD-10-CM

## 2018-12-15 DIAGNOSIS — I10 Essential (primary) hypertension: Secondary | ICD-10-CM | POA: Diagnosis not present

## 2018-12-15 DIAGNOSIS — R0981 Nasal congestion: Secondary | ICD-10-CM

## 2018-12-15 DIAGNOSIS — Z23 Encounter for immunization: Secondary | ICD-10-CM | POA: Diagnosis not present

## 2018-12-15 DIAGNOSIS — S46811D Strain of other muscles, fascia and tendons at shoulder and upper arm level, right arm, subsequent encounter: Secondary | ICD-10-CM | POA: Diagnosis not present

## 2018-12-15 DIAGNOSIS — J329 Chronic sinusitis, unspecified: Secondary | ICD-10-CM | POA: Diagnosis not present

## 2018-12-15 DIAGNOSIS — I6523 Occlusion and stenosis of bilateral carotid arteries: Secondary | ICD-10-CM | POA: Diagnosis not present

## 2018-12-15 LAB — LIPID PANEL
Cholesterol: 142 mg/dL (ref 0–200)
HDL: 47 mg/dL (ref >39.00)
LDL Cholesterol: 73 mg/dL (ref 0–99)
NonHDL: 94.95
Total CHOL/HDL Ratio: 3
Triglycerides: 108 mg/dL (ref 0.0–149.0)
VLDL: 21.6 mg/dL (ref 0.0–40.0)

## 2018-12-15 LAB — COMPREHENSIVE METABOLIC PANEL
ALT: 21 U/L (ref 0–53)
AST: 19 U/L (ref 0–37)
Albumin: 4.6 g/dL (ref 3.5–5.2)
Alkaline Phosphatase: 63 U/L (ref 39–117)
BUN: 20 mg/dL (ref 6–23)
CO2: 29 mEq/L (ref 19–32)
Calcium: 9.4 mg/dL (ref 8.4–10.5)
Chloride: 104 mEq/L (ref 96–112)
Creatinine, Ser: 1.09 mg/dL (ref 0.40–1.50)
GFR: 66.27 mL/min (ref >60.00)
Glucose, Bld: 108 mg/dL — ABNORMAL HIGH (ref 70–99)
Potassium: 4.7 mEq/L (ref 3.5–5.1)
Sodium: 139 mEq/L (ref 135–145)
Total Bilirubin: 1.7 mg/dL — ABNORMAL HIGH (ref 0.2–1.2)
Total Protein: 6.8 g/dL (ref 6.0–8.3)

## 2018-12-15 LAB — BRAIN NATRIURETIC PEPTIDE: Pro B Natriuretic peptide (BNP): 11 pg/mL (ref 0.0–100.0)

## 2018-12-15 MED ORDER — ALBUTEROL SULFATE HFA 108 (90 BASE) MCG/ACT IN AERS: 2.0000 | INHALATION_SPRAY | Freq: Four times a day (QID) | RESPIRATORY_TRACT | 0 refills | Status: DC | PRN

## 2018-12-15 MED ORDER — AZITHROMYCIN 250 MG PO TABS: ORAL_TABLET | ORAL | 0 refills | Status: DC

## 2018-12-15 MED ORDER — FLUTICASONE PROPIONATE 50 MCG/ACT NA SUSP: 2.0000 | Freq: Every day | NASAL | 1 refills | Status: DC

## 2018-12-15 MED ORDER — CYCLOBENZAPRINE HCL 5 MG PO TABS: ORAL_TABLET | ORAL | 0 refills | Status: DC

## 2018-12-15 NOTE — Addendum Note (Signed)
Addended by: Kem Boroughs D on: 12/15/2018 12:25 PM   Modules accepted: Orders

## 2018-12-15 NOTE — Progress Notes (Addendum)
Subjective:    Patient ID: Jerry Ferguson, male    DOB: Aug 24, 1945, 73 y.o.   MRN: [id number]  HPI Pt in for follow up.  He states he his nasal congestion for about 3 weeks. When weather changed he noticed this. Now has some frontal sinus pressure. No sneezing. If blows his nose get maybe little pieces of colored mucus.   Pt has hx of 1.  Aortic stenosis - s/p TAVR. Pt has follow up with cardiologist next month.  Echo 08/11/2018 below.  1. The left ventricle has normal systolic function with an ejection fraction of 60-65%. The cavity size was normal. Severe basal septal hypertrophy. Otherwise, moderate concentric LVH of remaining myocardium. Diastolic dysfunction, grade indeterminate.  Indeterminate filling pressures No evidence of left ventricular regional wall motion abnormalities.  2. The right ventricle has normal systolic function. The cavity was normal. There is no increase in right ventricular wall thickness.  3. Left atrial size was moderately dilated.  4. The mitral valve is degenerative. Mild thickening of the mitral valve leaflet. Moderate calcification of the anterior mitral valve leaflet tip.  5. The tricuspid valve is grossly normal.  6. The aorta is normal in size and structure.  7. - TAVR: S/p TAVR with 26 mm Sapien 3 valve. Mild regurgitation.  Pt states since august he has felt mild sob with ambulation. No chest pain reported.  Has not worsened. Mild and intermittent. No reported wheezing. Overall he states since surgery his endurance is less. Pt used to smoke years ago. Stopped in 1991 but smoked for 20 years. About pack a day on average.   Pt has hx of carotid artery disease and he was advised needs surgery. But has not done yet.   He has history of htn. Pt blood pressure controlled today.   Review of Systems  Constitutional: Negative for chills, fatigue and fever.  HENT: Negative for congestion, sinus pressure and sinus pain.   Respiratory: Negative for cough,  choking and wheezing.   Cardiovascular: Negative for chest pain and palpitations.  Gastrointestinal: Negative for abdominal pain, diarrhea, nausea and vomiting.  Musculoskeletal: Negative for back pain.       Rt trapezius is tender and tight. Pt used muscle relexants in past and no severe side effects.  Skin: Negative for rash.  Neurological: Negative for dizziness and headaches.  Hematological: Negative for adenopathy. Does not bruise/bleed easily.  Psychiatric/Behavioral: Negative for behavioral problems, confusion and decreased concentration.    Past Medical History:  Diagnosis Date  . Abnormal liver function test   . Acute bronchitis   . Carotid artery occlusion    Bilateral Bruit  . Cataract   . Coronary artery disease involving native coronary artery of native heart without angina pectoris   . DJD (degenerative joint disease)   . Dyspnea   . GERD (gastroesophageal reflux disease)   . Heart murmur   . History of UTI   . Hypercholesteremia   . Hypertension   . Lumbar back pain   . Overweight(278.02)   . Pulmonary nodules    Multiple small pulmonary nodules scattered throughout both lungs, 12 month CT rec if pt high risk  . S/P TAVR (transcatheter aortic valve replacement)    26 mm Edwards Sapien 3 transcatheter heart valve placed via percutaneous right transfemoral approach   . Severe aortic stenosis   . Severe aortic stenosis      Social History   Socioeconomic History  . Marital status: Widowed    Spouse  name: stacey(deceased 2011)  . Number of children: 1  . Years of education: Not on file  . Highest education level: Not on file  Occupational History  . Occupation: welder  Social Needs  . Financial resource strain: Not on file  . Food insecurity    Worry: Not on file    Inability: Not on file  . Transportation needs    Medical: Not on file    Non-medical: Not on file  Tobacco Use  . Smoking status: Former Smoker    Packs/day: 1.00    Years: 15.00     Pack years: 15.00    Types: Cigarettes    Quit date: 01/06/1989    Years since quitting: 29.9  . Smokeless tobacco: Never Used  . Tobacco comment: chewed some in early 20's  Substance and Sexual Activity  . Alcohol use: No  . Drug use: No  . Sexual activity: Not on file  Lifestyle  . Physical activity    Days per week: Not on file    Minutes per session: Not on file  . Stress: Not on file  Relationships  . Social Herbalist on phone: Not on file    Gets together: Not on file    Attends religious service: Not on file    Active member of club or organization: Not on file    Attends meetings of clubs or organizations: Not on file    Relationship status: Not on file  . Intimate partner violence    Fear of current or ex partner: Not on file    Emotionally abused: Not on file    Physically abused: Not on file    Forced sexual activity: Not on file  Other Topics Concern  . Not on file  Social History Narrative   Uncle with prostate cancer?    Past Surgical History:  Procedure Laterality Date  . APPENDECTOMY    . COLONOSCOPY    . CORONARY ANGIOGRAPHY N/A 02/04/2018   Procedure: CORONARY ANGIOGRAPHY;  Surgeon: Sherren Mocha, MD;  Location: [place] [facility];  Service: Cardiovascular;  Laterality: N/A;  . RIGHT HEART CATH N/A 02/04/2018   Procedure: RIGHT HEART CATH;  Surgeon: Sherren Mocha, MD;  Location: [place] [facility];  Service: Cardiovascular;  Laterality: N/A;  . TEE WITHOUT CARDIOVERSION N/A 02/23/2018   Procedure: TRANSESOPHAGEAL ECHOCARDIOGRAM (TEE);  Surgeon: Sherren Mocha, MD;  Location: [place] [facility];  Service: Open Heart Surgery;  Laterality: N/A;  . TRANSCATHETER AORTIC VALVE REPLACEMENT, TRANSFEMORAL  02/23/2018  . TRANSCATHETER AORTIC VALVE REPLACEMENT, TRANSFEMORAL N/A 02/23/2018   Procedure: TRANSCATHETER AORTIC VALVE REPLACEMENT, TRANSFEMORAL;  Surgeon: Sherren Mocha, MD;  Location: [place] [facility];  Service: Open Heart Surgery;   Laterality: N/A;    Family History  Problem Relation Age of Onset  . Colon cancer Father        metastatic  . Other Father        DJD  . Arthritis Father 16  . Healthy Mother        Living-87  . Prostate cancer Brother   . Heart attack Paternal Grandfather   . Heart disease Paternal Grandfather   . Cancer Paternal Uncle   . Colon cancer Sister   . Hypertension Daughter        x1  . Healthy Daughter        x1  . Esophageal cancer Neg Hx   . Stomach cancer Neg Hx   . Rectal cancer Neg Hx  No Known Allergies  Current Outpatient Medications on File Prior to Visit  Medication Sig Dispense Refill  . atorvastatin (LIPITOR) 20 MG tablet TAKE 1 TABLET BY MOUTH EVERY DAY 90 tablet 0  . clopidogrel (PLAVIX) 75 MG tablet Take 1 tablet (75 mg total) by mouth daily with breakfast. 90 tablet 3  . lisinopril (ZESTRIL) 5 MG tablet TAKE 1 TABLET BY MOUTH EVERY DAY 90 tablet 1  . fluticasone (FLONASE) 50 MCG/ACT nasal spray Place 1 spray into both nostrils as needed for allergies or rhinitis.     No current facility-administered medications on file prior to visit.     BP 128/68 (BP Location: Left Arm, Patient Position: Sitting, Cuff Size: Normal)   Pulse 78   Temp (!) 96.9 F (36.1 C) (Temporal)   Resp 18   Ht 5\' 11"  (1.803 m)   Wt 206 lb (93.4 kg)   SpO2 98%   BMI 28.73 kg/m       Objective:   Physical Exam  General Mental Status- Alert. General Appearance- Not in acute distress.   Skin General: Color- Normal Color. Moisture- Normal Moisture.  Neck Carotid Arteries- Normal color. Moisture- Normal Moisture. No carotid bruits. No JVD. Rt trapezius tenderness to palpation.  Chest and Lung Exam Auscultation: Breath Sounds:-Normal.  Cardiovascular Auscultation:Rythm- Regular. Murmurs & Other Heart Sounds:Auscultation of the heart reveals- No Murmurs.  Abdomen Inspection:-Inspeection Normal. Palpation/Percussion:Note:No mass. Palpation and Percussion of the abdomen  reveal- Non Tender, Non Distended + BS, no rebound or guarding.   Neurologic Cranial Nerve exam:- CN III-XII intact(No nystagmus), symmetric smile. Strength:- 5/5 equal and symmetric strength both upper and lower extremities.  Lower ext- no pedal edema. Negative homans signs.      Assessment & Plan:  For nasal congestion, I will rx flonase and for sinus pressure will rx azithromycin.  For occasional dypsnea and history of smoking will get cxr, bnp and rx albuterol. To us if dyspnea significant or any wheezing.  For carotid stenosis due recommend that you follow up with vascular surgeon as they recommended surgery.  For htn will get cmp and lipid panel today.  Follow up date to be determined after lab review.  25+ minutes spent with pt. 50% of time spent counseling pt on plan going forward.  Esperanza RichtersEdward Sherika Kubicki, PA-C

## 2018-12-15 NOTE — Patient Instructions (Addendum)
For nasal congestion, I will rx flonase and for sinus pressure will rx azithromycin.  For occasional dypsnea and history of smoking will get cxr, bnp and rx albuterol. To [place] if dyspnea significant or any wheezing.  For carotid stenosis due recommend that you follow up with vascular surgeon as they recommended surgery.  For htn will get cmp and lipid panel today.  Follow up date to be determined after lab review.

## 2018-12-20 ENCOUNTER — Telehealth: Payer: Self-pay | Admitting: Medical

## 2018-12-20 NOTE — Telephone Encounter (Signed)
Pt given lab and Xray results per notes of Mackie Pai PA on 12/15/2018. Pt verbalized understanding.

## 2018-12-27 ENCOUNTER — Other Ambulatory Visit: Payer: Self-pay | Admitting: Medical

## 2018-12-27 DIAGNOSIS — R0989 Other specified symptoms and signs involving the circulatory and respiratory systems: Secondary | ICD-10-CM

## 2018-12-27 DIAGNOSIS — I10 Essential (primary) hypertension: Secondary | ICD-10-CM

## 2018-12-27 DIAGNOSIS — I350 Nonrheumatic aortic (valve) stenosis: Secondary | ICD-10-CM

## 2018-12-27 DIAGNOSIS — I779 Disorder of arteries and arterioles, unspecified: Secondary | ICD-10-CM

## 2018-12-27 DIAGNOSIS — E785 Hyperlipidemia, unspecified: Secondary | ICD-10-CM

## 2018-12-27 MED ORDER — ATORVASTATIN CALCIUM 20 MG PO TABS: 20.0000 mg | ORAL_TABLET | Freq: Every day | ORAL | 3 refills | Status: DC

## 2018-12-27 NOTE — Telephone Encounter (Signed)
Medication Refill - Medication: atorvastatin (LIPITOR) 20 MG tablet  Has the patient contacted their pharmacy? Yes.   (Agent: If no, request that the patient contact the pharmacy for the refill.) (Agent: If yes, when and what did the pharmacy advise?)  Preferred Pharmacy (with phone number or street name): [id number] [place], [place]  Agent: Please be advised that RX refills may take up to 3 business days. We ask that you follow-up with your pharmacy.

## 2019-01-06 ENCOUNTER — Other Ambulatory Visit: Payer: Self-pay | Admitting: Medical

## 2019-01-13 ENCOUNTER — Other Ambulatory Visit: Payer: Self-pay | Admitting: Medical

## 2019-01-25 NOTE — Progress Notes (Signed)
Cardiology Office Note:    Date:  01/26/2019   ID:  Jerry Ferguson, DOB 18-Jun-1945, MRN [id number]  PCP:  Esperanza Richters, PA-C  Cardiologist:  Kristeen Miss, MD  Electrophysiologist:  None   Referring MD: Marisue Brooklyn   Chief Complaint  Patient presents with  . Aortic Stenosis     Nov. 6, 2019   Jerry Ferguson is a 74 y.o. male with a hx of  HTN, HLD and aorticstenosis.  We were asked to see him by Esperanza Richters, PA-C for further evaluation of his aortic stenosis.   His last echocardiogram was performed June, 2015.  At that time, he had normal left ventricular systolic function.  He had moderate aortic stenosis. The mean aortic valve gradient was 23 mmHg with a peak aortic valve gradient of 41 mmHg. Mildly dilated ascending aorta.  He feels quite well Has mild DOE Mows his lawn without any problem  No CP , no syncope  No cough,  No weight gain Has lost some weight - has been traveling lots   Jan. 20, 2021  Jerry Ferguson is seen today after a several year absence He has recently had a TAVR Echocardiogram from today reveals normal left ventricular systolic function.  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   NYHA class  II  .   Gets short of breath with some activities - yard work )   has been more short of breath since we started Plavix instead of ASA  No angina.    Has RCA disease with collaterals from the left.  Has at least a 50% stenosis in his left circumflex artery peers in some views it appears to be a very tight ostial lesion but other views suggested only moderate in severity.  He has mild disease in his LAD.  He has a tight right carotid stenosis that needs to have a carotid endarterectomy.  Because of personal family issues and Covid he has not been able to get that fixed.   He will call Dr. Burna Forts office   The CT scan shows multiple small pulmonary nodules that are benign.  The CT is unchanged from previous  CT scans and he does not need any further follow-up on this pulmonary nodules.  I have personally reviewed the echo images.  He has mild to moderate aortic insufficiency which is likely to be due to a perivalvular leak.   Past Medical History:  Diagnosis Date  . Abnormal liver function test   . Acute bronchitis   . Carotid artery occlusion    Bilateral Bruit  . Cataract   . Coronary artery disease involving native coronary artery of native heart without angina pectoris   . DJD (degenerative joint disease)   . Dyspnea   . GERD (gastroesophageal reflux disease)   . Heart murmur   . History of UTI   . Hypercholesteremia   . Hypertension   . Lumbar back pain   . Overweight(278.02)   . Pulmonary nodules    Multiple small pulmonary nodules scattered throughout both lungs, 12 month CT rec if pt high risk  . S/P TAVR (transcatheter aortic valve replacement)    26 mm Edwards Sapien 3 transcatheter heart valve placed via percutaneous right transfemoral approach   . Severe aortic stenosis   . Severe aortic stenosis     Past Surgical History:  Procedure Laterality Date  . APPENDECTOMY    . COLONOSCOPY    . CORONARY ANGIOGRAPHY  N/A 02/04/2018   Procedure: CORONARY ANGIOGRAPHY;  Surgeon: Sherren Mocha, MD;  Location: [place] [facility];  Service: Cardiovascular;  Laterality: N/A;  . RIGHT HEART CATH N/A 02/04/2018   Procedure: RIGHT HEART CATH;  Surgeon: Sherren Mocha, MD;  Location: [place] [facility];  Service: Cardiovascular;  Laterality: N/A;  . TEE WITHOUT CARDIOVERSION N/A 02/23/2018   Procedure: TRANSESOPHAGEAL ECHOCARDIOGRAM (TEE);  Surgeon: Sherren Mocha, MD;  Location: [place] [facility];  Service: Open Heart Surgery;  Laterality: N/A;  . TRANSCATHETER AORTIC VALVE REPLACEMENT, TRANSFEMORAL  02/23/2018  . TRANSCATHETER AORTIC VALVE REPLACEMENT, TRANSFEMORAL N/A 02/23/2018   Procedure: TRANSCATHETER AORTIC VALVE REPLACEMENT, TRANSFEMORAL;  Surgeon: Sherren Mocha, MD;   Location: [place] [facility];  Service: Open Heart Surgery;  Laterality: N/A;    Current Medications: Current Meds  Medication Sig  . albuterol (VENTOLIN HFA) 108 (90 Base) MCG/ACT inhaler Inhale 2 puffs into the lungs every 6 (six) hours as needed.  Marland Kitchen atorvastatin (LIPITOR) 20 MG tablet Take 1 tablet (20 mg total) by mouth daily.  . clopidogrel (PLAVIX) 75 MG tablet Take 1 tablet (75 mg total) by mouth daily with breakfast.  . cyclobenzaprine (FLEXERIL) 5 MG tablet 1 tab po q hs as needed neck pain  . fluticasone (FLONASE) 50 MCG/ACT nasal spray SPRAY 2 SPRAYS INTO EACH NOSTRIL EVERY DAY  . lisinopril (ZESTRIL) 5 MG tablet TAKE 1 TABLET BY MOUTH EVERY DAY     Allergies:   Patient has no known allergies.   Social History   Socioeconomic History  . Marital status: Widowed    Spouse name: stacey(deceased 2009/05/04)  . Number of children: 1  . Years of education: Not on file  . Highest education level: Not on file  Occupational History  . Occupation: welder  Tobacco Use  . Smoking status: Former Smoker    Packs/day: 1.00    Years: 15.00    Pack years: 15.00    Types: Cigarettes    Quit date: 01/06/1989    Years since quitting: 30.0  . Smokeless tobacco: Never Used  . Tobacco comment: chewed some in early 2022-05-05  Substance and Sexual Activity  . Alcohol use: No  . Drug use: No  . Sexual activity: Not on file  Other Topics Concern  . Not on file  Social History Narrative   Uncle with prostate cancer?   Social Determinants of Health   Financial Resource Strain:   . Difficulty of Paying Living Expenses: Not on file  Food Insecurity:   . Worried About Charity fundraiser in the Last Year: Not on file  . Ran Out of Food in the Last Year: Not on file  Transportation Needs:   . Lack of Transportation (Medical): Not on file  . Lack of Transportation (Non-Medical): Not on file  Physical Activity:   . Days of Exercise per Week: Not on file  . Minutes of Exercise per Session: Not on  file  Stress:   . Feeling of Stress : Not on file  Social Connections:   . Frequency of Communication with Friends and Family: Not on file  . Frequency of Social Gatherings with Friends and Family: Not on file  . Attends Religious Services: Not on file  . Active Member of Clubs or Organizations: Not on file  . Attends Archivist Meetings: Not on file  . Marital Status: Not on file     Family History: The patient's family history includes Arthritis (age of onset: 59) in his  father; Cancer in his paternal uncle; Colon cancer in his father and sister; Healthy in his daughter and mother; Heart attack in his paternal grandfather; Heart disease in his paternal grandfather; Hypertension in his daughter; Other in his father; Prostate cancer in his brother. There is no history of Esophageal cancer, Stomach cancer, or Rectal cancer.  ROS:   Please see the history of present illness.     All other systems reviewed and are negative.  EKGs/Labs/Other Studies Reviewed:      EKG: Normal sinus rhythm at 62.  T wave inversions in lead aVL and V6.  Recent Labs: 02/22/2018: B Natriuretic Peptide 28.7 02/24/2018: Hemoglobin 12.0; Magnesium 2.0; Platelets 171 12/15/2018: ALT 21; BUN 20; Creatinine, Ser 1.09; Potassium 4.7; Pro B Natriuretic peptide (BNP) 11.0; Sodium 139  Recent Lipid Panel    Component Value Date/Time   CHOL 142 12/15/2018 1208   TRIG 108.0 12/15/2018 1208   HDL 47.00 12/15/2018 1208   CHOLHDL 3 12/15/2018 1208   VLDL 21.6 12/15/2018 1208   LDLCALC 73 12/15/2018 1208   LDLDIRECT 95 11/04/2013 1038    Physical Exam:       ASSESSMENT:    1. S/P TAVR (transcatheter aortic valve replacement)   2. SOB (shortness of breath)    PLAN:    In order of problems listed above:  1. Aortic stenosis:  S/p TAVR.  He has mild to moderate aortic insufficiency that is likely due to a perivalvular leak.  We will plan repeating the echocardiogram in a year-sooner if the TAVR  team would like.  He is having mild shortness of breath with activities involving light yard work.  NYHA class  II   He has coronary artery disease involving his right coronary artery and circumflex artery but he is not having any angina.  I do not think that his symptoms are necessarily anginal.  2.  Shortness of breath: He thinks that he is more short of breath since being on the Plavix.  He has not had a CBC in 11 months.  Is possible that he might be having a slow GI bleed and might have some anemia to explain the shortness of breath.  We will check a CBC today.  3.  Lung nodules: Follow-up CT scan revealed just small benign-appearing lung nodules.  These nodules are unchanged from his last CT scan and do not need additional radiology follow-up according to the official radiologist interpretation today.  4.  Coronary artery disease: He has fairly severe RCA disease with good left-to-right collaterals.  He has at least moderate disease involving the circumflex artery.  He is not having any angina.  Continue Plavix for now.    Medication Adjustments/Labs and Tests Ordered: Current medicines are reviewed at length with the patient today.  Concerns regarding medicines are outlined above.  Orders Placed This Encounter  Procedures  . CBC  . EKG 12-Lead  . ECHOCARDIOGRAM COMPLETE   No orders of the defined types were placed in this encounter.   Patient Instructions  Medication Instructions:  Your physician recommends that you continue on your current medications as directed. Please refer to the Current Medication list given to you today.  *If you need a refill on your cardiac medications before your next appointment, please call your pharmacy*  Lab Work: CBC today  If you have labs (blood work) drawn today and your tests are completely normal, you will receive your results only by: Marland Kitchen MyChart Message (if you have MyChart) OR . A  paper copy in the mail If you have any lab test that  is abnormal or we need to change your treatment, we will call you to review the results.  Testing/Procedures: Your physician has requested that you have an echocardiogram in one year. Echocardiography is a painless test that uses sound waves to create images of your heart. It provides your doctor with information about the size and shape of your heart and how well your heart's chambers and valves are working. This procedure takes approximately one hour. There are no restrictions for this procedure.    Follow-Up: At [facility], you and your health needs are our priority.  As part of our continuing mission to provide you with exceptional heart care, we have created designated Provider Care Teams.  These Care Teams include your primary Cardiologist (physician) and Advanced Practice Providers (APPs -  Physician Assistants and Nurse Practitioners) who all work together to provide you with the care you need, when you need it.  Your next appointment:   12 month(s)  The format for your next appointment:   In Person  Provider:   You may see Kristeen Miss, MD or one of the following Advanced Practice Providers on your designated Care Team:    Tereso Newcomer, PA-C  Vin [place], [place]  Berton Bon, NP   Other Instructions  Your physician recommends that you schedule a follow-up appointment in: 6 months with Bary Castilla, PA-C.       Signed, Kristeen Miss, MD  01/26/2019 12:14 PM    [facility]

## 2019-01-26 ENCOUNTER — Other Ambulatory Visit: Payer: PPO

## 2019-01-26 ENCOUNTER — Encounter (INDEPENDENT_AMBULATORY_CARE_PROVIDER_SITE_OTHER): Payer: Self-pay

## 2019-01-26 ENCOUNTER — Ambulatory Visit: Payer: PPO | Admitting: Cardiovascular Disease

## 2019-01-26 ENCOUNTER — Other Ambulatory Visit: Payer: PPO | Admitting: *Deleted

## 2019-01-26 ENCOUNTER — Other Ambulatory Visit: Payer: Self-pay

## 2019-01-26 ENCOUNTER — Ambulatory Visit (INDEPENDENT_AMBULATORY_CARE_PROVIDER_SITE_OTHER)
Admission: RE | Admit: 2019-01-26 | Discharge: 2019-01-26 | Disposition: A | Discharge status: Home / Self Care | Payer: PPO | Source: Ambulatory Visit | Attending: Physician Assistant | Admitting: Physician Assistant

## 2019-01-26 ENCOUNTER — Encounter: Payer: Self-pay | Admitting: Cardiovascular Disease

## 2019-01-26 ENCOUNTER — Ambulatory Visit (HOSPITAL_COMMUNITY): Payer: PPO | Attending: Cardiovascular Disease

## 2019-01-26 VITALS — BP 136/72 | HR 62 | Ht 71.0 in | Wt 214.0 lb

## 2019-01-26 DIAGNOSIS — I10 Essential (primary) hypertension: Secondary | ICD-10-CM

## 2019-01-26 DIAGNOSIS — Z952 Presence of prosthetic heart valve: Secondary | ICD-10-CM | POA: Insufficient documentation

## 2019-01-26 DIAGNOSIS — R0602 Shortness of breath: Secondary | ICD-10-CM

## 2019-01-26 DIAGNOSIS — I350 Nonrheumatic aortic (valve) stenosis: Secondary | ICD-10-CM | POA: Diagnosis not present

## 2019-01-26 DIAGNOSIS — I2510 Atherosclerotic heart disease of native coronary artery without angina pectoris: Secondary | ICD-10-CM

## 2019-01-26 DIAGNOSIS — R918 Other nonspecific abnormal finding of lung field: Secondary | ICD-10-CM | POA: Diagnosis not present

## 2019-01-26 DIAGNOSIS — I6523 Occlusion and stenosis of bilateral carotid arteries: Secondary | ICD-10-CM | POA: Diagnosis not present

## 2019-01-26 DIAGNOSIS — E782 Mixed hyperlipidemia: Secondary | ICD-10-CM | POA: Diagnosis not present

## 2019-01-26 LAB — LIPID PANEL
Chol/HDL Ratio: 2.5 ratio (ref 0.0–5.0)
Cholesterol, Total: 134 mg/dL (ref 100–199)
HDL: 53 mg/dL (ref >39)
LDL Chol Calc (NIH): 67 mg/dL (ref 0–99)
Triglycerides: 69 mg/dL (ref 0–149)
VLDL Cholesterol Cal: 14 mg/dL (ref 5–40)

## 2019-01-26 LAB — BASIC METABOLIC PANEL
BUN/Creatinine Ratio: 17 (ref 10–24)
BUN: 18 mg/dL (ref 8–27)
CO2: 23 mmol/L (ref 20–29)
Calcium: 9.1 mg/dL (ref 8.6–10.2)
Chloride: 105 mmol/L (ref 96–106)
Creatinine, Ser: 1.06 mg/dL (ref 0.76–1.27)
GFR calc Af Amer: 80 mL/min/{1.73_m2} (ref >59)
GFR calc non Af Amer: 69 mL/min/{1.73_m2} (ref >59)
Glucose: 101 mg/dL — ABNORMAL HIGH (ref 65–99)
Potassium: 4.3 mmol/L (ref 3.5–5.2)
Sodium: 143 mmol/L (ref 134–144)

## 2019-01-26 LAB — HEPATIC FUNCTION PANEL
ALT: 15 IU/L (ref 0–44)
AST: 16 IU/L (ref 0–40)
Albumin: 4.7 g/dL (ref 3.7–4.7)
Alkaline Phosphatase: 72 IU/L (ref 39–117)
Bilirubin Total: 0.9 mg/dL (ref 0.0–1.2)
Bilirubin, Direct: 0.22 mg/dL (ref 0.00–0.40)
Total Protein: 6.7 g/dL (ref 6.0–8.5)

## 2019-01-26 LAB — CBC
Hematocrit: 40 % (ref 37.5–51.0)
Hemoglobin: 13.6 g/dL (ref 13.0–17.7)
MCH: 29.4 pg (ref 26.6–33.0)
MCHC: 34 g/dL (ref 31.5–35.7)
MCV: 86 fL (ref 79–97)
Platelets: 205 10*3/uL (ref 150–450)
RBC: 4.63 x10E6/uL (ref 4.14–5.80)
RDW: 14.5 % (ref 11.6–15.4)
WBC: 7.4 10*3/uL (ref 3.4–10.8)

## 2019-01-26 MED ORDER — PERFLUTREN LIPID MICROSPHERE
1.0000 mL | INTRAVENOUS | Status: AC | PRN
  Administered 2019-01-26: 2 mL via INTRAVENOUS

## 2019-01-26 NOTE — Patient Instructions (Signed)
Medication Instructions:  Your physician recommends that you continue on your current medications as directed. Please refer to the Current Medication list given to you today.  *If you need a refill on your cardiac medications before your next appointment, please call your pharmacy*  Lab Work: CBC today  If you have labs (blood work) drawn today and your tests are completely normal, you will receive your results only by: Marland Kitchen MyChart Message (if you have MyChart) OR . A paper copy in the mail If you have any lab test that is abnormal or we need to change your treatment, we will call you to review the results.  Testing/Procedures: Your physician has requested that you have an echocardiogram in one year. Echocardiography is a painless test that uses sound waves to create images of your heart. It provides your doctor with information about the size and shape of your heart and how well your heart's chambers and valves are working. This procedure takes approximately one hour. There are no restrictions for this procedure.    Follow-Up: At [facility], you and your health needs are our priority.  As part of our continuing mission to provide you with exceptional heart care, we have created designated Provider Care Teams.  These Care Teams include your primary Cardiologist (physician) and Advanced Practice Providers (APPs -  Physician Assistants and Nurse Practitioners) who all work together to provide you with the care you need, when you need it.  Your next appointment:   12 month(s)  The format for your next appointment:   In Person  Provider:   You may see Kristeen Miss, MD or one of the following Advanced Practice Providers on your designated Care Team:    Tereso Newcomer, PA-C  Vin Grandin, [place]  Berton Bon, NP   Other Instructions  Your physician recommends that you schedule a follow-up appointment in: 6 months with Bary Castilla, PA-C.

## 2019-02-06 ENCOUNTER — Other Ambulatory Visit: Payer: Self-pay | Admitting: Medical

## 2019-02-17 ENCOUNTER — Telehealth: Payer: Self-pay | Admitting: Medical

## 2019-02-17 NOTE — Progress Notes (Signed)
  Chronic Care Management   Outreach Note  02/17/2019 Name: METE PURDUM MRN: [id number] DOB: 10-Jun-1945  Referred by: Esperanza Richters, PA-C Reason for referral : No chief complaint on file.   An unsuccessful telephone outreach was attempted today. The patient was referred to the pharmacist for assistance with care management and care coordination.   Follow Up Plan:   Raynicia Dukes [employer] Scheduler

## 2019-02-21 ENCOUNTER — Other Ambulatory Visit: Payer: Self-pay | Admitting: Medical

## 2019-02-21 ENCOUNTER — Other Ambulatory Visit: Payer: Self-pay | Admitting: Physician Assistant

## 2019-02-21 DIAGNOSIS — Z952 Presence of prosthetic heart valve: Secondary | ICD-10-CM

## 2019-02-23 ENCOUNTER — Inpatient Hospital Stay: Admission: RE | Admit: 2019-02-23 | Payer: Self-pay | Source: Ambulatory Visit

## 2019-02-23 ENCOUNTER — Other Ambulatory Visit (HOSPITAL_COMMUNITY): Payer: Self-pay

## 2019-02-23 ENCOUNTER — Ambulatory Visit: Payer: Self-pay | Admitting: Physician Assistant

## 2019-03-01 ENCOUNTER — Telehealth: Payer: Self-pay | Admitting: Medical

## 2019-03-01 NOTE — Progress Notes (Signed)
  Chronic Care Management   Outreach Note  03/01/2019 Name: Jerry Ferguson MRN: [id number] DOB: October 15, 1945  Referred by: Esperanza Richters, PA-C Reason for referral : No chief complaint on file.   A second unsuccessful telephone outreach was attempted today. The patient was referred to pharmacist for assistance with care management and care coordination.  Follow Up Plan:   Raynicia Dukes [employer] Scheduler

## 2019-03-01 NOTE — Chronic Care Management (AMB) (Signed)
  Chronic Care Management   Note  03/01/2019 Name: Jerry Ferguson MRN: [id number] DOB: 24-Jan-1945  Jerry Ferguson is a 74 y.o. year old male who is a primary care patient of [name], [name], [place]. I reached out to Joycie Peek by phone today in response to a referral sent by Mr. Jerry Ferguson's PCP, [name], [name], PA-C.   Jerry Ferguson was given information about Chronic Care Management services today including:  1. CCM service includes personalized support from designated clinical staff supervised by his physician, including individualized plan of care and coordination with other care providers 2. 24/7 contact phone numbers for assistance for urgent and routine care needs. 3. Service will only be billed when office clinical staff spend 20 minutes or more in a month to coordinate care. 4. Only one practitioner may furnish and bill the service in a calendar month. 5. The patient may stop CCM services at any time (effective at the end of the month) by phone call to the office staff. 6. The patient will be responsible for cost sharing (co-pay) of up to 20% of the service fee (after annual deductible is met).  Patient agreed to services and verbal consent obtained.   Follow up plan:   Raynicia Dukes [employer] Scheduler

## 2019-03-04 ENCOUNTER — Other Ambulatory Visit: Payer: Self-pay | Admitting: *Deleted

## 2019-03-04 DIAGNOSIS — I10 Essential (primary) hypertension: Secondary | ICD-10-CM

## 2019-03-11 ENCOUNTER — Telehealth: Payer: PPO

## 2019-03-23 ENCOUNTER — Other Ambulatory Visit: Payer: Self-pay

## 2019-03-28 ENCOUNTER — Telehealth: Payer: Self-pay | Admitting: Medical

## 2019-03-28 NOTE — Progress Notes (Signed)
Trying to reschedule patient for CCM appointment.      Jerry Ferguson [employer] Scheduler

## 2019-04-20 ENCOUNTER — Telehealth: Payer: Self-pay | Admitting: Medical

## 2019-04-20 NOTE — Progress Notes (Signed)
Patient had canceled his previous CCM appointment. Patient was contacted and rescheduled his CCM appointment.        Raynicia Dukes [employer] Scheduler

## 2019-05-17 ENCOUNTER — Telehealth: Payer: PPO

## 2019-05-31 ENCOUNTER — Ambulatory Visit: Payer: PPO | Admitting: Pharmacist

## 2019-05-31 ENCOUNTER — Other Ambulatory Visit: Payer: Self-pay

## 2019-05-31 DIAGNOSIS — E782 Mixed hyperlipidemia: Secondary | ICD-10-CM

## 2019-05-31 DIAGNOSIS — I10 Essential (primary) hypertension: Secondary | ICD-10-CM

## 2019-05-31 NOTE — Chronic Care Management (AMB) (Signed)
Chronic Care Management Pharmacy  Name: Jerry Ferguson  MRN: [id number] DOB: 07/10/1945  Chief Complaint/ HPI  Jerry Ferguson,  74 y.o. , male presents for their Initial CCM visit with the clinical pharmacist via telephone due to COVID-19 Pandemic.  PCP : Mackie Pai, PA-C  Their chronic conditions include: Hypertension, Hyperlipidemia/CAD, Allergic Rhinitis  Office Visits: 12/15/18: Visit w/ Mackie Pai, PA-C - Nasal congestion and sinus pressure prescribed flonase and azith. Occasional dyspnea prescribed albuterol. Labs ordered (cmp, bnp, lipid)  Consult Visit: 01/26/19: Cardio visit w/ Dr. 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 Repeat ECHO in 1 year. Pt reports SOB since starting Plavix. Concern for possible anemia. CBC ordered. Lung nodules unchanged no additional radiology follow up needed. Fairly severe RCA dx. No angina. Continue Plavix for now.   Medications: Outpatient Encounter Medications as of 05/31/2019  Medication Sig  . albuterol (VENTOLIN HFA) 108 (90 Base) MCG/ACT inhaler Inhale 2 puffs into the lungs every 6 (six) hours as needed.  Marland Kitchen atorvastatin (LIPITOR) 20 MG tablet Take 1 tablet (20 mg total) by mouth daily.  . clopidogrel (PLAVIX) 75 MG tablet Take 1 tablet (75 mg total) by mouth daily with breakfast.  . cyclobenzaprine (FLEXERIL) 5 MG tablet 1 tab po q hs as needed neck pain  . fluticasone (FLONASE) 50 MCG/ACT nasal spray SPRAY 2 SPRAYS INTO EACH NOSTRIL EVERY DAY  . lisinopril (ZESTRIL) 5 MG tablet TAKE 1 TABLET BY MOUTH EVERY DAY   No facility-administered encounter medications on file as of 05/31/2019.   SDOH Screenings   Alcohol Screen:   . Last Alcohol Screening Score (AUDIT):   Depression (PHQ2-9):   . PHQ-2 Score:   Financial Resource Strain: Low Risk   . Difficulty of Paying Living Expenses: Not hard at all  Food Insecurity:   . Worried About Charity fundraiser in the Last Year:   .  [place] in the Last Year:   Housing:   . Last Housing Risk Score:   Physical Activity:   . Days of Exercise per Week:   . Minutes of Exercise per Session:   Social Connections:   . Frequency of Communication with Friends and Family:   . Frequency of Social Gatherings with Friends and Family:   . Attends Religious Services:   . Active Member of Clubs or Organizations:   . Attends Archivist Meetings:   Marland Kitchen Marital Status:   Stress:   . Feeling of Stress :   Tobacco Use: Medium Risk  . Smoking Tobacco Use: Former Smoker  . Smokeless Tobacco Use: Never Used  Transportation Needs:   . Film/video editor (Medical):   Marland Kitchen Lack of Transportation (Non-Medical):     Current Diagnosis/Assessment:  Goals Addressed            This Visit's Progress   . Chronic Care Management Pharmacy Care Plan       CARE PLAN ENTRY  Current Barriers:  . Chronic Disease Management support, education, and care coordination needs related to Hypertension, Hyperlipidemia/CAD, Allergic Rhinitis   Hypertension . Pharmacist Clinical Goal(s): o Over the next 180 days, patient will work with PharmD and providers to maintain BP goal <140/90 . Current regimen:  o Lisinopril 75m daily . Patient self care activities - Over the next 180 days, patient will: o Maintain hypertension medication regimen.   Hyperlipidemia . Pharmacist Clinical Goal(s): o Over the next 180 days, patient will work  with PharmD and providers to maintain LDL goal <70 . Current regimen:  o Atorvastatin 31m daily . Patient self care activities - Over the next 180 days, patient will: o Maintain cholesterol medication regimen.   Medication management . Pharmacist Clinical Goal(s): o Over the next 180 days, patient will work with PharmD and providers to maintain optimal medication adherence . [facility] . Interventions o Comprehensive medication review performed. o Continue current medication management  strategy . Patient self care activities - Over the next 180 days, patient will: o Focus on medication adherence by filling medications appropriately  o Take medications as prescribed o Report any questions or concerns to PharmD and/or provider(s)  Initial goal documentation       [place]: [place] mom [place]/10 [place]  [place] [employer] [place]11 years ago. Lives alone 2 children. 4 grandsons (2 by each daughter). One grandson is autistic.  No pets. Used to have a little dog, but is unsure of what happened to him.   Uses a pill box.   Hypertension   CMP Latest Ref Rng & Units 01/26/2019 12/15/2018 02/24/2018  Glucose 65 - 99 mg/dL 101(H) 108(H) 109(H)  BUN 8 - 27 mg/dL _0 Creatinine 0.76 - 1.27 mg/dL 1.06 1.09 0.98  Sodium 134 - 144 mmol/L 143 139 138  Potassium 3.5 - 5.2 mmol/L 4.3 4.7 3.7  Chloride 96 - 106 mmol/L 105 104 105  CO2 20 - 29 mmol/L _1 Calcium 8.6 - 10.2 mg/dL 9.1 9.4 8.8(L)  Total Protein 6.0 - 8.5 g/dL 6.7 6.8 -  Total Bilirubin 0.0 - 1.2 mg/dL 0.9 1.7(H) -  Alkaline Phos 39 - 117 IU/L 72 63 -  AST 0 - 40 IU/L 16 19 -  ALT 0 - 44 IU/L 15 21 -   Kidney Function Lab Results  Component Value Date/Time   CREATININE 1.06 01/26/2019 07:33 AM   CREATININE 1.09 12/15/2018 12:08 PM   CREATININE 0.97 11/04/2013 10:38 AM   CREATININE 1.02 05/04/2013 03:53 PM   GFR 66.27 12/15/2018 12:08 PM   GFRNONAA 69 01/26/2019 07:33 AM   GFRNONAA 80 11/04/2013 10:38 AM   GFRAA 80 01/26/2019 07:33 AM   GFRAA >89 11/04/2013 10:38 AM    BP today is:  Unable to assess due to phone visit   Office blood pressures are  BP Readings from Last 3 Encounters:  01/26/19 136/72  12/15/18 128/68  05/04/18 125/69   Blood pressure goal <140/90  Patient has failed these meds in the past: None noted  Patient is currently  controlled on the following medications: Lisinopril 589mdaily  Per Dispense Report:  Lisinopril 04/08/19: 90 DS 01/13/19: 90 DS  Patient checks BP at home Never (does not have a BP cuff)  Patient home BP readings are ranging: N/A  We discussed Blood pressure goals  Plan -Continue current medications   Hyperlipidemia/CAD   Lipid Panel     Component Value Date/Time   CHOL 134 01/26/2019 0733   TRIG 69 01/26/2019 0733   HDL 53 01/26/2019 0733   LDLCALC 67 01/26/2019 0733   LDLDIRECT 95 11/04/2013 1038    LDL goal <70    The 10-year ASCVD risk score (GMikey BussingC Jr., et al., 2002-10-13is: 23.4%   Values used to calculate the score:  Age: 39 years     Sex: Male     Is Non-Hispanic African American: No     Diabetic: No     Tobacco smoker: No     Systolic Blood Pressure: 784 mmHg     Is BP treated: Yes     HDL Cholesterol: 53 mg/dL     Total Cholesterol: 134 mg/dL   Patient has failed these meds in past: .None noted  Patient is currently controlled on the following medications: Atorvastatin 19m daily, clopidogrel 730mdaily  Followed by Cardio  Per Dispense Report: Atorvastatin 03/27/19: 90 DS 12/27/19: 90 DS  Clopidogrel 05/18/19: 90 DS 02/19/19: 90 DS  Patient states that he has discussed the duration of therapy for plavix and was told to continue it. States he was originally taking aspirin and then plavix was added and eventually aspirin was D/C'd.   We discussed:  Usual course of plavix dosing post TAVR procedure noting that cardio is managing this  Plan -Continue current medications  Allergic Rhinitis    Patient has failed these meds in past: None noted  Patient is currently controlled on the following medications: Flonase 2 sprays each nostril daily  Uses flonase as needed  Plan -Continue current medications   Miscellaneous Meds  Cyclobenzaprine Albuterol (doesn't use often; last used a week ago)

## 2019-05-31 NOTE — Patient Instructions (Addendum)
Visit Information  Goals Addressed            This Visit's Progress   . Chronic Care Management Pharmacy Care Plan       CARE PLAN ENTRY  Current Barriers:  . Chronic Disease Management support, education, and care coordination needs related to Hypertension, Hyperlipidemia/CAD, Allergic Rhinitis   Hypertension . Pharmacist Clinical Goal(s): o Over the next 180 days, patient will work with PharmD and providers to maintain BP goal <140/90 . Current regimen:  o Lisinopril '5mg'$  daily . Patient self care activities - Over the next 180 days, patient will: o Maintain hypertension medication regimen.   Hyperlipidemia . Pharmacist Clinical Goal(s): o Over the next 180 days, patient will work with PharmD and providers to maintain LDL goal <70 . Current regimen:  o Atorvastatin '20mg'$  daily . Patient self care activities - Over the next 180 days, patient will: o Maintain cholesterol medication regimen.   Medication management . Pharmacist Clinical Goal(s): o Over the next 180 days, patient will work with PharmD and providers to maintain optimal medication adherence . [facility] . Interventions o Comprehensive medication review performed. o Continue current medication management strategy . Patient self care activities - Over the next 180 days, patient will: o Focus on medication adherence by filling medications appropriately  o Take medications as prescribed o Report any questions or concerns to PharmD and/or provider(s)  Initial goal documentation        Jerry Ferguson was given information about Chronic Care Management services today including:  1. CCM service includes personalized support from designated clinical staff supervised by his physician, including individualized plan of care and coordination with other care providers 2. 24/7 contact phone numbers for assistance for urgent and routine care needs. 3. Standard insurance, coinsurance, copays and deductibles apply  for chronic care management only during months in which we provide at least 20 minutes of these services. Most insurances cover these services at 100%, however patients may be responsible for any copay, coinsurance and/or deductible if applicable. This service may help you avoid the need for more expensive face-to-face services. 4. Only one practitioner may furnish and bill the service in a calendar month. 5. The patient may stop CCM services at any time (effective at the end of the month) by phone call to the office staff.  Patient agreed to services and verbal consent obtained.   The patient verbalized understanding of instructions provided today and agreed to receive a mailed copy of patient instruction and/or educational materials. Telephone follow up appointment with pharmacy team member scheduled for: around 12/05/2019  Melvenia Beam Henritta Mutz, PharmD Clinical Pharmacist [facility] [phone]    Healthy Eating Following a healthy eating pattern may help you to achieve and maintain a healthy body weight, reduce the risk of chronic disease, and live a long and productive life. It is important to follow a healthy eating pattern at an appropriate calorie level for your body. Your nutritional needs should be met primarily through food by choosing a variety of nutrient-rich foods. What are tips for following this plan? Reading food labels  Read labels and choose the following: ? Reduced or low sodium. ? Juices with 100% fruit juice. ? Foods with low saturated fats and high polyunsaturated and monounsaturated fats. ? Foods with whole grains, such as whole wheat, cracked wheat, brown rice, and wild rice. ? Whole grains that are fortified with folic acid. This is recommended for women who are pregnant or who  want to become pregnant.  Read labels and avoid the following: ? Foods with a lot of added sugars. These include foods that contain brown sugar, corn sweetener,  corn syrup, dextrose, fructose, glucose, high-fructose corn syrup, honey, invert sugar, lactose, malt syrup, maltose, molasses, raw sugar, sucrose, trehalose, or turbinado sugar.  Do not eat more than the following amounts of added sugar per Jerry Ferguson:  6 teaspoons (25 g) for women.  9 teaspoons (38 g) for men. ? Foods that contain processed or refined starches and grains. ? Refined grain products, such as white flour, degermed cornmeal, white bread, and white rice. Shopping  Choose nutrient-rich snacks, such as vegetables, whole fruits, and nuts. Avoid high-calorie and high-sugar snacks, such as potato chips, fruit snacks, and candy.  Use oil-based dressings and spreads on foods instead of solid fats such as butter, stick margarine, or cream cheese.  Limit pre-made sauces, mixes, and "instant" products such as flavored rice, instant noodles, and ready-made pasta.  Try more plant-protein sources, such as tofu, tempeh, black beans, edamame, lentils, nuts, and seeds.  Explore eating plans such as the Mediterranean diet or vegetarian diet. Cooking  Use oil to saut or stir-fry foods instead of solid fats such as butter, stick margarine, or lard.  Try baking, boiling, grilling, or broiling instead of frying.  Remove the fatty part of meats before cooking.  Steam vegetables in water or broth. Meal planning   At meals, imagine dividing your plate into fourths: ? One-half of your plate is fruits and vegetables. ? One-fourth of your plate is whole grains. ? One-fourth of your plate is protein, especially lean meats, poultry, eggs, tofu, beans, or nuts.  Include low-fat dairy as part of your daily diet. Lifestyle  Choose healthy options in all settings, including home, work, school, restaurants, or stores.  Prepare your food safely: ? Wash your hands after handling raw meats. ? Keep food preparation surfaces clean by regularly washing with hot, soapy water. ? Keep raw meats separate  from ready-to-eat foods, such as fruits and vegetables. ? Cook seafood, meat, poultry, and eggs to the recommended internal temperature. ? Store foods at safe temperatures. In general:  Keep cold foods at 18F (4.4C) or below.  Keep hot foods at 118F (60C) or above.  Keep your freezer at [facility] (-17.8C) or below.  Foods are no longer safe to eat when they have been between the temperatures of 40-118F (4.4-60C) for more than 2 hours. What foods should I eat? Fruits Aim to eat 2 cup-equivalents of fresh, canned (in natural juice), or frozen fruits each Jerry Ferguson. Examples of 1 cup-equivalent of fruit include 1 small apple, 8 large strawberries, 1 cup canned fruit,  cup dried fruit, or 1 cup 100% juice. Vegetables Aim to eat 2-3 cup-equivalents of fresh and frozen vegetables each Jerry Ferguson, including different varieties and colors. Examples of 1 cup-equivalent of vegetables include 2 medium carrots, 2 cups raw, leafy greens, 1 cup chopped vegetable (raw or cooked), or 1 medium baked potato. Grains Aim to eat 6 ounce-equivalents of whole grains each Jerry Ferguson. Examples of 1 ounce-equivalent of grains include 1 slice of bread, 1 cup ready-to-eat cereal, 3 cups popcorn, or  cup cooked rice, pasta, or cereal. Meats and other proteins Aim to eat 5-6 ounce-equivalents of protein each Jerry Ferguson. Examples of 1 ounce-equivalent of protein include 1 egg, 1/2 cup nuts or seeds, or 1 tablespoon (16 g) peanut butter. A cut of meat or fish that is the size of a deck of cards  is about 3-4 ounce-equivalents.  Of the protein you eat each week, try to have at least 8 ounces come from seafood. This includes salmon, trout, herring, and anchovies. Dairy Aim to eat 3 cup-equivalents of fat-free or low-fat dairy each Jerry Ferguson. Examples of 1 cup-equivalent of dairy include 1 cup (240 mL) milk, 8 ounces (250 g) yogurt, 1 ounces (44 g) natural cheese, or 1 cup (240 mL) fortified soy milk. Fats and oils  Aim for about 5 teaspoons (21 g) per  Jerry Ferguson. Choose monounsaturated fats, such as canola and olive oils, avocados, peanut butter, and most nuts, or polyunsaturated fats, such as sunflower, corn, and soybean oils, walnuts, pine nuts, sesame seeds, sunflower seeds, and flaxseed. Beverages  Aim for six 8-oz glasses of water per Jerry Ferguson. Limit coffee to three to five 8-oz cups per Jerry Ferguson.  Limit caffeinated beverages that have added calories, such as soda and energy drinks.  Limit alcohol intake to no more than 1 drink a Jerry Ferguson for nonpregnant women and 2 drinks a Jerry Ferguson for men. One drink equals 12 oz of beer (355 mL), 5 oz of wine (148 mL), or 1 oz of hard liquor (44 mL). Seasoning and other foods  Avoid adding excess amounts of salt to your foods. Try flavoring foods with herbs and spices instead of salt.  Avoid adding sugar to foods.  Try using oil-based dressings, sauces, and spreads instead of solid fats. This information is based on general [place] nutrition guidelines. For more information, visit [employer].es. Exact amounts may vary based on your nutrition needs. Summary  A healthy eating plan may help you to maintain a healthy weight, reduce the risk of chronic diseases, and stay active throughout your life.  Plan your meals. Make sure you eat the right portions of a variety of nutrient-rich foods.  Try baking, boiling, grilling, or broiling instead of frying.  Choose healthy options in all settings, including home, work, school, restaurants, or stores. This information is not intended to replace advice given to you by your health care provider. Make sure you discuss any questions you have with your health care provider. Document Revised: 04/06/2017 Document Reviewed: 04/06/2017 Elsevier Patient Education  [place].

## 2019-07-01 ENCOUNTER — Other Ambulatory Visit: Payer: Self-pay | Admitting: Medical

## 2019-07-25 NOTE — Progress Notes (Signed)
[facility]   MULTIDISCIPLINARY HEART VALVE CLINIC                                       Cardiology Office Note    Date:  07/27/2019   ID:  Jerry Ferguson, DOB [birth year], MRN [id number]  PCP:  Esperanza Richters, PA-C  Cardiologist:  Kristeen Miss, MD / Dr. Excell Seltzer & Dr. Cornelius Moras (TAVR)  CC: follow up PVL- 6 month follow up.   History of Present Illness:  Jerry Ferguson is a 74 y.o. 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   Patient's cardiac history dates back more than 6 years ago when he had 2 brief syncopal episodes. He underwent carotid duplex scan which revealed moderate bilateral 60-79%internal carotid artery stenosis. He was evaluated by Dr. Delton See and noted to have a heart murmur on exam. Echocardiogram revealed normal left ventricular systolic function with mild to moderate aortic stenosis with mean transvalvular gradient estimated 19 mmHg. He was referred for vascular surgical consultation and initially evaluated by Dr. Imogene Burn. Medical therapy with follow-up was recommended. He was lost to follow-up for several years but was referred back by his primary care physician for cardiology consultation and evaluated by Dr. Elease Hashimoto on November 11, 2017. At that time the patient reported mild symptoms of exertional shortness of breath. Follow-up transthoracic echocardiogram performed November 25, 2017 revealed severe aortic stenosis with normal left ventricular systolic function. Peak velocity across aortic valve measured 4.8 m/s corresponding to mean transvalvular gradient estimated 50 mmHg. The DVI was 0.21 with aortic valve area calculated 0.81 cm. Initially continued observation was recommended. However, shortly after that the patient was seen by Dr. Chestine Spore for follow-up of his bilateral carotid artery stenosis.  Carotid duplex scan performed January 12, 2018 revealed high-grade 80-99%right internal carotid artery stenosis. There was 40 to 59% left internal carotid artery stenosis. Right carotid endarterectomy was recommended and the patient was referred backto Dr. Phineas Semen preoperative cardiac clearance. The patient was subsequently referred to the multidisciplinary heart valve clinic and underwent left and right heart catheterization by Dr. Excell Seltzer on February 04, 2018. Catheterization revealed multivessel coronary artery disease with severe diffuse high-grade multisegment stenosis of the right coronary artery and left to right collateral filling of the posterior descending coronary artery and right posterolateral branch. There was moderate ostial stenosis of the left circumflex coronary artery and nonobstructive disease in the left anterior descending coronary artery. Medical therapy was recommended.   He underwent successful TAVR with a29mm Edwards Sapien 3 THV via the TF approach on 02/23/18. Post operative echoshowed EF 60%, normally functioning TAVR with mean gradient 9.61mm Hg and trivial PVL. He initially had a LBBB but this resolved prior to discharge. He was discharged on POD 1 on aspirin and plavix.   Seen by PCP in 12/2019 and reported dyspnea. CXR and BNP were normal. He was seen by Dr. Elease Hashimoto in the office on 01/26/19. 1 year echo s/p TAVR on EF 60%, normally functioning TAVR with mean gradient of 5 mm Hg and mild PVL. Mild MR/PR and mild dilatation of the aortic root measuring 39 mm.  Dr. Elease Hashimoto felt that his PVL was mild to moderate and recommended 6 month follow up in the valve clinic.  Today he presents to clinic for follow up. Has ongoing dyspnea that is unchanged. He thinks it started when he was put on plavix ( which was after his TAVR). Mostly notes this with exertion. No CP. No LE edema, orthopnea or PND. No dizziness or syncope. No blood in stool or urine. No palpitations. Has not been  vaccinated for covid but is thinking about it  ( I have urged him to get shots in near future).    Past Medical History:  Diagnosis Date  . Abnormal liver function test   . Acute bronchitis   . Carotid artery occlusion    Bilateral Bruit  . Cataract   . Coronary artery disease involving native coronary artery of native heart without angina pectoris   . DJD (degenerative joint disease)   . Dyspnea   . GERD (gastroesophageal reflux disease)   . Heart murmur   . History of UTI   . Hypercholesteremia   . Hypertension   . Lumbar back pain   . Overweight(278.02)   . Pulmonary nodules    Multiple small pulmonary nodules scattered throughout both lungs, 12 month CT rec if pt high risk  . S/P TAVR (transcatheter aortic valve replacement)    26 mm Edwards Sapien 3 transcatheter heart valve placed via percutaneous right transfemoral approach   . Severe aortic stenosis   . Severe aortic stenosis     Past Surgical History:  Procedure Laterality Date  . APPENDECTOMY    . COLONOSCOPY    . CORONARY ANGIOGRAPHY N/A 02/04/2018   Procedure: CORONARY ANGIOGRAPHY;  Surgeon: Tonny Bollman, MD;  Location: [facility] INVASIVE CV LAB;  Service: Cardiovascular;  Laterality: N/A;  . RIGHT HEART CATH N/A 02/04/2018   Procedure: RIGHT HEART CATH;  Surgeon: Tonny Bollman, MD;  Location: [facility] INVASIVE CV LAB;  Service: Cardiovascular;  Laterality: N/A;  . TEE WITHOUT CARDIOVERSION N/A 02/23/2018   Procedure: TRANSESOPHAGEAL ECHOCARDIOGRAM (TEE);  Surgeon: Tonny Bollman, MD;  Location: [facility] INVASIVE CV LAB;  Service: Open Heart Surgery;  Laterality: N/A;  . TRANSCATHETER AORTIC VALVE REPLACEMENT, TRANSFEMORAL  02/23/2018  . TRANSCATHETER AORTIC VALVE REPLACEMENT, TRANSFEMORAL N/A 02/23/2018   Procedure: TRANSCATHETER AORTIC VALVE REPLACEMENT, TRANSFEMORAL;  Surgeon: Tonny Bollman, MD;  Location: [facility] INVASIVE CV LAB;  Service: Open Heart Surgery;  Laterality: N/A;    Current Medications: Outpatient Medications  Prior to Visit  Medication Sig Dispense Refill  . albuterol (VENTOLIN HFA) 108 (90 Base) MCG/ACT inhaler Inhale 2 puffs into the lungs every 6 (six) hours as needed. 18 g 0  . atorvastatin (LIPITOR) 20 MG tablet Take 1 tablet (20 mg total) by mouth daily. 90 tablet 3  . cyclobenzaprine (FLEXERIL) 5 MG tablet 1 tab po q hs as needed neck pain 10 tablet 0  . fluticasone (FLONASE) 50 MCG/ACT nasal spray SPRAY 2 SPRAYS INTO EACH NOSTRIL EVERY DAY 48 mL 1  . lisinopril (ZESTRIL) 5 MG tablet TAKE 1 TABLET BY MOUTH EVERY DAY 30 tablet 0  . clopidogrel (PLAVIX) 75 MG tablet Take 1 tablet (75 mg total) by mouth daily with breakfast. 90 tablet 3   No facility-administered medications prior to visit.     Allergies:   Patient has no known allergies.   Social History   Socioeconomic History  . Marital status: Widowed    Spouse name: stacey(deceased Apr 17, 2009)  . Number of children: 1  . Years of education: Not on file  . Highest education level: Not on file  Occupational History  . Occupation: welder  Tobacco  Use  . Smoking status: Former Smoker    Packs/day: 1.00    Years: 15.00    Pack years: 15.00    Types: Cigarettes    Quit date: 01/06/1989    Years since quitting: 30.5  . Smokeless tobacco: Never Used  . Tobacco comment: chewed some in early 20's  Vaping Use  . Vaping Use: Never used  Substance and Sexual Activity  . Alcohol use: No  . Drug use: No  . Sexual activity: Not on file  Other Topics Concern  . Not on file  Social History Narrative   Uncle with prostate cancer?   Social Determinants of Health   Financial Resource Strain: Low Risk   . Difficulty of Paying Living Expenses: Not hard at all  Food Insecurity:   . Worried About Programme researcher, broadcasting/film/video in the Last Year:   . Barista in the Last Year:   Transportation Needs:   . Freight forwarder (Medical):   Marland Kitchen Lack of Transportation (Non-Medical):   Physical Activity:   . Days of Exercise per Week:   . Minutes of  Exercise per Session:   Stress:   . Feeling of Stress :   Social Connections:   . Frequency of Communication with Friends and Family:   . Frequency of Social Gatherings with Friends and Family:   . Attends Religious Services:   . Active Member of Clubs or Organizations:   . Attends Banker Meetings:   Marland Kitchen Marital Status:      Family History:  The patient's family history includes Arthritis (age of onset: 96) in his father; Cancer in his paternal uncle; Colon cancer in his father and sister; Healthy in his daughter and mother; Heart attack in his paternal grandfather; Heart disease in his paternal grandfather; Hypertension in his daughter; Other in his father; Prostate cancer in his brother.      ROS:   Please see the history of present illness.    ROS All other systems reviewed and are negative.   PHYSICAL EXAM:   VS:  BP 136/74   Pulse 67   Ht 5\' 11"  (1.803 m)   Wt 206 lb (93.4 kg)   SpO2 98%   BMI 28.73 kg/m    GEN: Well nourished, well developed, in no acute distress HEENT: normal Neck: no JVD or masses Cardiac: RRR; soft flow murmur. No diastolic murmur. No rubs, or gallops,no edema  Respiratory:  clear to auscultation bilaterally, normal work of breathing GI: soft, nontender, nondistended, + BS MS: no deformity or atrophy Skin: warm and dry, no rash.  Neuro:  Alert and Oriented x 3, Strength and sensation are intact Psych: euthymic mood, full affect   Wt Readings from Last 3 Encounters:  07/27/19 206 lb (93.4 kg)  01/26/19 214 lb (97.1 kg)  12/15/18 206 lb (93.4 kg)      Studies/Labs Reviewed:   EKG:  EKG is NOT ordered today.    Recent Labs: 12/15/2018: Pro B Natriuretic peptide (BNP) 11.0 01/26/2019: ALT 15; BUN 18; Creatinine, Ser 1.06; Hemoglobin 13.6; Platelets 205; Potassium 4.3; Sodium 143   Lipid Panel    Component Value Date/Time   CHOL 134 01/26/2019 0733   TRIG 69 01/26/2019 0733   HDL 53 01/26/2019 0733   CHOLHDL 2.5 01/26/2019  0733   CHOLHDL 3 12/15/2018 1208   VLDL 21.6 12/15/2018 1208   LDLCALC 67 01/26/2019 0733   LDLDIRECT 95 11/04/2013 1038    Additional studies/ records  that were reviewed today include:  TAVR OPERATIVE NOTE   Date of Procedure:02/23/2018  Preoperative Diagnosis:Severe Aortic Stenosis   Postoperative Diagnosis:Same  Procedure:   Transcatheter Aortic Valve Replacement - PercutaneousRightTransfemoral Approach Edwards Sapien 3 THV (size 26mm, model # 9600TFX, serial # [id number])  Co-Surgeons:Clarence H. Cornelius Moraswen, MD and Tonny BollmanMichael Cooper, MD  Anesthesiologist:William Aleene DavidsonE. Fitzgerald, MD  Echocardiographer:Peter Eden EmmsNishan, MD  Pre-operative Echo Findings: ? Severe aortic stenosis ? Normalleft ventricular systolic function  Post-operative Echo Findings: ? Trivialparavalvular leak ? Normalleft ventricular systolic function  _____________   Echo 02/24/18 IMPRESSIONS  1. The left ventricle has normal systolic function with an ejection fraction of 60-65%. The cavity size was normal. There is severely increased left ventricular wall thickness. Left ventricular diastolic Doppler parameters are consistent with impaired  relaxation.  2. The right ventricle has normal systolic function. The cavity was normal. There is no increase in right ventricular wall thickness.  3. Left atrial size was mildly dilated.  4. The mitral valve is degenerative. Moderate thickening of the mitral valve leaflet. Moderate calcification of the mitral valve leaflet.  5. Nodular thickening of the anterior leaflet with narrow LVOT no gradient noted.  6. The tricuspid valve is normal in structure.  7. The aortic valve is tricuspid Aortic valve regurgitation is trivial by color flow Doppler.  8. Post TAVR with 26 mm Sapien 3 valve trivial PVL.  9. The pulmonic valve was grossly normal. Pulmonic valve  regurgitation is mild by color flow Doppler. 10. The aortic root is normal in size and structure.   ____________________   Echo 01/26/19 IMPRESSIONS  1. Left ventricular ejection fraction, by visual estimation, is 60 to  65%. The left ventricle has normal function. There is mildly increased  left ventricular hypertrophy.  2. Definity contrast agent was given IV to delineate the left ventricular  endocardial borders.  3. The left ventricle has no regional wall motion abnormalities.  4. Global right ventricle has normal systolic function.The right  ventricular size is normal. No increase in right ventricular wall  thickness.  5. Left atrial size was moderately dilated.  6. Right atrial size was normal.  7. Mild mitral annular calcification.  8. The mitral valve is normal in structure. Mild mitral valve  regurgitation.  9. The tricuspid valve is normal in structure.  10. The tricuspid valve is normal in structure. Tricuspid valve  regurgitation is mild.  11. Aortic valve regurgitation is mild.  12. Post TAVR with 26 mm Sapien 3 mild AR likely peri valvular but hard to  tell. mean gradients are low and improved since 08/11/18 mean 10->5 mmHg  peak 19-> 8 mmHg.  13. Pulmonic regurgitation is mild.  14. The pulmonic valve was grossly normal. Pulmonic valve regurgitation is  mild.  15. Aortic dilatation noted.  16. There is mild dilatation of the aortic root measuring 39 mm.  17. Mildly elevated pulmonary artery systolic pressure.   ASSESSMENT & PLAN:   Severe AS s/p TAVR:doing okay. He has ongoing dyspnea on exertion that has been present over the last year with no real change. 1 year echo s/p TAVR in January showed normally functioning valve with mild to mod PVL. Previous CXR and BNP have been normal. Still on plavix. Will discontinue this now and have him start a baby aspirin. SBE prophylaxis discussed; I have RX'd amoxicillin. There is no diastolic murmur on exam and pulse  pressure normal, I doubt AI contributing to symptoms, but I will recheck echo now.   CAD: pre  TAVR cath showed severe single vessel CAD with diffuse stenosis of the RCA. No chest pain. He has been treated with medical therapy with a statin and antiplatelets. Dyspnea could be an anginal equivalent with known coronary disease. I will get a lexiscan nuclear stress test now, which he would likely need anyways with potential upcoming carotid intervention.   HTN:BP with moderate control. No changes made today.   Carotid artery disease: he has a known 80-99% RICA stenosis followed by Dr. Chestine Spore. Plan is for CEA at some point. He has been lost to follow up with [facility]. Will make sure Dr. Ophelia Charter office reaches out to him to arrange follow up. Will get nuclear stress test as outlined above.   Medication Adjustments/Labs and Tests Ordered: Current medicines are reviewed at length with the patient today.  Concerns regarding medicines are outlined above.  Medication changes, Labs and Tests ordered today are listed in the Patient Instructions below. Patient Instructions  Medication Instructions:  Your physician has recommended you make the following change in your medication:  1.) stop clopidogrel (Plavix) 2.) start aspirin 81 mg daily 3.) amoxicillin 500 mg --take 4 TABLETS (2000 MG) by mouth ONE HOUR PRIOR TO ANY DENTAL PROCEDURES OR CLEANINGS  *If you need a refill on your cardiac medications before your next appointment, please call your pharmacy*   Lab Work: none If you have labs (blood work) drawn today and your tests are completely normal, you will receive your results only by: Marland Kitchen MyChart Message (if you have MyChart) OR . A paper copy in the mail If you have any lab test that is abnormal or we need to change your treatment, we will call you to review the results.   Testing/Procedures: Your physician has requested that you have an echocardiogram. Echocardiography is a painless test that uses  sound waves to create images of your heart. It provides your doctor with information about the size and shape of your heart and how well your heart's chambers and valves are working. This procedure takes approximately one hour. There are no restrictions for this procedure.  Your physician has requested that you have a lexiscan myoview. For further information please visit [email or web address] Please follow instruction sheet, as given.   Follow-Up: At [facility], you and your health needs are our priority.  As part of our continuing mission to provide you with exceptional heart care, we have created designated Provider Care Teams.  These Care Teams include your primary Cardiologist (physician) and Advanced Practice Providers (APPs -  Physician Assistants and Nurse Practitioners) who all work together to provide you with the care you need, when you need it.  We recommend signing up for the patient portal called "MyChart".  Sign up information is provided on this After Visit Summary.  MyChart is used to connect with patients for Virtual Visits (Telemedicine).  Patients are able to view lab/test results, encounter notes, upcoming appointments, etc.  Non-urgent messages can be sent to your provider as well.   To learn more about what you can do with MyChart, go to [email or web address].    Your next appointment:   6 month(s)  The format for your next appointment:   In Person  Provider:   You may see Kristeen Miss, MD or one of the following Advanced Practice Providers on your designated Care Team:    Tereso Newcomer, PA-C  Chelsea Aus, PA-C    Other Instructions      Signed, Cline Crock, PA-C  07/27/2019 2:09 PM  [facility] [address], [place], [place]  [address] Phone: [phone]; Fax: [phone]

## 2019-07-26 ENCOUNTER — Other Ambulatory Visit: Payer: Self-pay | Admitting: Medical

## 2019-07-27 ENCOUNTER — Ambulatory Visit (INDEPENDENT_AMBULATORY_CARE_PROVIDER_SITE_OTHER): Payer: PPO | Admitting: Physician Assistant

## 2019-07-27 ENCOUNTER — Encounter: Payer: Self-pay | Admitting: Physician Assistant

## 2019-07-27 ENCOUNTER — Other Ambulatory Visit: Payer: Self-pay

## 2019-07-27 ENCOUNTER — Encounter: Payer: Self-pay | Admitting: *Deleted

## 2019-07-27 VITALS — BP 136/74 | HR 67 | Ht 71.0 in | Wt 206.0 lb

## 2019-07-27 DIAGNOSIS — Z952 Presence of prosthetic heart valve: Secondary | ICD-10-CM

## 2019-07-27 DIAGNOSIS — I10 Essential (primary) hypertension: Secondary | ICD-10-CM

## 2019-07-27 DIAGNOSIS — I6523 Occlusion and stenosis of bilateral carotid arteries: Secondary | ICD-10-CM

## 2019-07-27 DIAGNOSIS — I2510 Atherosclerotic heart disease of native coronary artery without angina pectoris: Secondary | ICD-10-CM

## 2019-07-27 MED ORDER — AMOXICILLIN 500 MG PO CAPS
ORAL_CAPSULE | ORAL | 2 refills | Status: DC
Start: 2019-07-27 — End: 2021-01-17

## 2019-07-27 MED ORDER — ASPIRIN EC 81 MG PO TBEC
81.0000 mg | DELAYED_RELEASE_TABLET | Freq: Every day | ORAL | 3 refills | Status: AC
Start: 2019-07-27 — End: ?

## 2019-07-27 NOTE — Addendum Note (Signed)
Addended by: Lendon Ka on: 07/27/2019 02:15 PM   Modules accepted: Orders

## 2019-07-27 NOTE — Patient Instructions (Addendum)
Medication Instructions:  Your physician has recommended you make the following change in your medication:  1.) stop clopidogrel (Plavix) 2.) start aspirin 81 mg daily 3.) amoxicillin 500 mg --take 4 TABLETS (2000 MG) by mouth ONE HOUR PRIOR TO ANY DENTAL PROCEDURES OR CLEANINGS  *If you need a refill on your cardiac medications before your next appointment, please call your pharmacy*   Lab Work: none If you have labs (blood work) drawn today and your tests are completely normal, you will receive your results only by: Marland Kitchen MyChart Message (if you have MyChart) OR . A paper copy in the mail If you have any lab test that is abnormal or we need to change your treatment, we will call you to review the results.   Testing/Procedures: Your physician has requested that you have an echocardiogram. Echocardiography is a painless test that uses sound waves to create images of your heart. It provides your doctor with information about the size and shape of your heart and how well your heart's chambers and valves are working. This procedure takes approximately one hour. There are no restrictions for this procedure.  Your physician has requested that you have a lexiscan myoview. For further information please visit [email or web address] Please follow instruction sheet, as given.   Follow-Up: At [facility], you and your health needs are our priority.  As part of our continuing mission to provide you with exceptional heart care, we have created designated Provider Care Teams.  These Care Teams include your primary Cardiologist (physician) and Advanced Practice Providers (APPs -  Physician Assistants and Nurse Practitioners) who all work together to provide you with the care you need, when you need it.  We recommend signing up for the patient portal called "MyChart".  Sign up information is provided on this After Visit Summary.  MyChart is used to connect with patients for Virtual Visits (Telemedicine).   Patients are able to view lab/test results, encounter notes, upcoming appointments, etc.  Non-urgent messages can be sent to your provider as well.   To learn more about what you can do with MyChart, go to [email or web address].    Your next appointment:   6 month(s)  The format for your next appointment:   In Person  Provider:   You may see Kristeen Miss, MD or one of the following Advanced Practice Providers on your designated Care Team:    Tereso Newcomer, PA-C  Chelsea Aus, [place]    Other Instructions

## 2019-08-01 ENCOUNTER — Telehealth (HOSPITAL_COMMUNITY): Payer: Self-pay | Admitting: *Deleted

## 2019-08-01 ENCOUNTER — Telehealth: Payer: Self-pay | Admitting: Medical

## 2019-08-01 DIAGNOSIS — I6529 Occlusion and stenosis of unspecified carotid artery: Secondary | ICD-10-CM

## 2019-08-01 NOTE — Telephone Encounter (Signed)
Patient given detailed instructions per Myocardial Perfusion Study Information Sheet for the test on 08/03/19. Patient notified to arrive 15 minutes early and that it is imperative to arrive on time for appointment to keep from having the test rescheduled.  If you need to cancel or reschedule your appointment, please call the office within 24 hours of your appointment. . Patient verbalized understanding. Dali Kraner Jacqueline    

## 2019-08-02 NOTE — Telephone Encounter (Signed)
Opened to refer. 

## 2019-08-03 ENCOUNTER — Other Ambulatory Visit: Payer: Self-pay

## 2019-08-03 ENCOUNTER — Ambulatory Visit (HOSPITAL_COMMUNITY): Payer: PPO | Attending: Cardiology

## 2019-08-03 DIAGNOSIS — I2510 Atherosclerotic heart disease of native coronary artery without angina pectoris: Secondary | ICD-10-CM | POA: Diagnosis not present

## 2019-08-03 DIAGNOSIS — I10 Essential (primary) hypertension: Secondary | ICD-10-CM | POA: Diagnosis not present

## 2019-08-03 DIAGNOSIS — Z952 Presence of prosthetic heart valve: Secondary | ICD-10-CM | POA: Diagnosis not present

## 2019-08-03 DIAGNOSIS — I6523 Occlusion and stenosis of bilateral carotid arteries: Secondary | ICD-10-CM | POA: Insufficient documentation

## 2019-08-03 LAB — MYOCARDIAL PERFUSION IMAGING
LV dias vol: 100 mL (ref 62–150)
LV sys vol: 36 mL
Peak HR: 83 {beats}/min
Rest HR: 68 {beats}/min
SDS: 1
SRS: 0
SSS: 1
TID: 1.2

## 2019-08-03 MED ORDER — REGADENOSON 0.4 MG/5ML IV SOLN
0.4000 mg | Freq: Once | INTRAVENOUS | Status: AC
  Administered 2019-08-03: 0.4 mg via INTRAVENOUS

## 2019-08-03 MED ORDER — TECHNETIUM TC 99M TETROFOSMIN IV KIT
32.4000 | PACK | Freq: Once | INTRAVENOUS | Status: AC | PRN
  Administered 2019-08-03: 32.4 via INTRAVENOUS
  Filled 2019-08-03: qty 33

## 2019-08-03 MED ORDER — TECHNETIUM TC 99M TETROFOSMIN IV KIT
9.7000 | PACK | Freq: Once | INTRAVENOUS | Status: AC | PRN
  Administered 2019-08-03: 9.7 via INTRAVENOUS
  Filled 2019-08-03: qty 10

## 2019-08-11 ENCOUNTER — Other Ambulatory Visit: Payer: Self-pay

## 2019-08-11 ENCOUNTER — Ambulatory Visit (HOSPITAL_COMMUNITY): Payer: PPO | Attending: Internal Medicine

## 2019-08-11 DIAGNOSIS — Z952 Presence of prosthetic heart valve: Secondary | ICD-10-CM | POA: Diagnosis not present

## 2019-08-11 DIAGNOSIS — I6523 Occlusion and stenosis of bilateral carotid arteries: Secondary | ICD-10-CM

## 2019-08-11 DIAGNOSIS — I2510 Atherosclerotic heart disease of native coronary artery without angina pectoris: Secondary | ICD-10-CM | POA: Diagnosis not present

## 2019-08-11 DIAGNOSIS — I10 Essential (primary) hypertension: Secondary | ICD-10-CM | POA: Insufficient documentation

## 2019-08-11 LAB — ECHOCARDIOGRAM COMPLETE
AR max vel: 2.39 cm2
AV Area VTI: 2.87 cm2
AV Area mean vel: 2.32 cm2
AV Mean grad: 7 mmHg
AV Peak grad: 15.1 mmHg
Ao pk vel: 1.94 m/s
Area-P 1/2: 2.76 cm2
P 1/2 time: 312 msec
S' Lateral: 3.9 cm

## 2019-08-11 MED ORDER — PERFLUTREN LIPID MICROSPHERE
1.0000 mL | INTRAVENOUS | Status: AC | PRN
  Administered 2019-08-11: 1 mL via INTRAVENOUS

## 2019-08-18 ENCOUNTER — Other Ambulatory Visit: Payer: Self-pay | Admitting: Cardiovascular Disease

## 2019-08-20 ENCOUNTER — Other Ambulatory Visit: Payer: Self-pay | Admitting: Medical

## 2019-08-23 ENCOUNTER — Other Ambulatory Visit: Payer: Self-pay

## 2019-08-23 ENCOUNTER — Ambulatory Visit: Payer: PPO | Admitting: Vascular Surgery

## 2019-08-23 ENCOUNTER — Encounter: Payer: Self-pay | Admitting: Vascular Surgery

## 2019-08-23 VITALS — BP 142/61 | HR 72 | Temp 97.5°F | Resp 18 | Ht 71.0 in | Wt 209.0 lb

## 2019-08-23 DIAGNOSIS — I6523 Occlusion and stenosis of bilateral carotid arteries: Secondary | ICD-10-CM | POA: Diagnosis not present

## 2019-08-23 NOTE — Progress Notes (Signed)
Patient name: Jerry Ferguson MRN: [id number] DOB: 1945/10/14 Sex: male  REASON FOR CONSULT: Discuss right carotid endarterectomy  HPI: Jerry Ferguson is a 74 y.o. male, with multiple medical problems including hx severe aortic stenosis now s/p TAVR, hypertension, hyperlipidemia as well as known carotid disease that presents to discuss carotid surgery.  He was last seen by me on 05/04/18 and his right sided carotid disease had advanced to >80% stenosis.  His lesion was asymptomatic and he reported no history of TIA or stroke.   Offered to schedule his right carotid last year and ultimately he was going to call back to schedule but never did.  States he got busy with family and other needs.  Ultimately I got a message from cardiology to get him reestablished with our practice.  He was having some shortness of breath but has been worked up by cardiology and cleared for surgery with a normal echocardiogram on 08/11/19 with EF 65% and normal functioning TAVR.  Reports no neurologic events since I last evaluated him last year.  No head or neck surgery or radiation.  Takes an aspirin and statin daily.  Past Medical History:  Diagnosis Date  . Abnormal liver function test   . Acute bronchitis   . Carotid artery occlusion    Bilateral Bruit  . Cataract   . Coronary artery disease involving native coronary artery of native heart without angina pectoris   . DJD (degenerative joint disease)   . Dyspnea   . GERD (gastroesophageal reflux disease)   . Heart murmur   . History of UTI   . Hypercholesteremia   . Hypertension   . Lumbar back pain   . Overweight(278.02)   . Pulmonary nodules    Multiple small pulmonary nodules scattered throughout both lungs, 12 month CT rec if pt high risk  . S/P TAVR (transcatheter aortic valve replacement)    26 mm Edwards Sapien 3 transcatheter heart valve placed via percutaneous right transfemoral approach   . Severe aortic stenosis   . Severe aortic stenosis       Past Surgical History:  Procedure Laterality Date  . APPENDECTOMY    . COLONOSCOPY    . CORONARY ANGIOGRAPHY N/A 02/04/2018   Procedure: CORONARY ANGIOGRAPHY;  Surgeon: Tonny Bollman, MD;  Location: [facility] INVASIVE CV LAB;  Service: Cardiovascular;  Laterality: N/A;  . RIGHT HEART CATH N/A 02/04/2018   Procedure: RIGHT HEART CATH;  Surgeon: Tonny Bollman, MD;  Location: [facility] INVASIVE CV LAB;  Service: Cardiovascular;  Laterality: N/A;  . TEE WITHOUT CARDIOVERSION N/A 02/23/2018   Procedure: TRANSESOPHAGEAL ECHOCARDIOGRAM (TEE);  Surgeon: Tonny Bollman, MD;  Location: [facility] INVASIVE CV LAB;  Service: Open Heart Surgery;  Laterality: N/A;  . TRANSCATHETER AORTIC VALVE REPLACEMENT, TRANSFEMORAL  02/23/2018  . TRANSCATHETER AORTIC VALVE REPLACEMENT, TRANSFEMORAL N/A 02/23/2018   Procedure: TRANSCATHETER AORTIC VALVE REPLACEMENT, TRANSFEMORAL;  Surgeon: Tonny Bollman, MD;  Location: [facility] INVASIVE CV LAB;  Service: Open Heart Surgery;  Laterality: N/A;    Family History  Problem Relation Age of Onset  . Colon cancer Father        metastatic  . Other Father        DJD  . Arthritis Father 29  . Healthy Mother        Living-87  . Prostate cancer Brother   . Heart attack Paternal Grandfather   . Heart disease Paternal Grandfather   . Cancer Paternal Uncle   . Colon cancer Sister   . Hypertension  Daughter        x1  . Healthy Daughter        x1  . Esophageal cancer Neg Hx   . Stomach cancer Neg Hx   . Rectal cancer Neg Hx     SOCIAL HISTORY: Social History   Socioeconomic History  . Marital status: Widowed    Spouse name: stacey(deceased 2011)  . Number of children: 1  . Years of education: Not on file  . Highest education level: Not on file  Occupational History  . Occupation: welder  Tobacco Use  . Smoking status: Former Smoker    Packs/day: 1.00    Years: 15.00    Pack years: 15.00    Types: Cigarettes    Quit date: 01/06/1989    Years since quitting: 30.6  . Smokeless  tobacco: Never Used  . Tobacco comment: chewed some in early 20's  Vaping Use  . Vaping Use: Never used  Substance and Sexual Activity  . Alcohol use: No  . Drug use: No  . Sexual activity: Not on file  Other Topics Concern  . Not on file  Social History Narrative   Uncle with prostate cancer?   Social Determinants of Health   Financial Resource Strain: Low Risk   . Difficulty of Paying Living Expenses: Not hard at all  Food Insecurity:   . Worried About Programme researcher, broadcasting/film/videounning Out of Food in the Last Year:   . Baristaan Out of Food in the Last Year:   Transportation Needs:   . Freight forwarderLack of Transportation (Medical):   Marland Kitchen. Lack of Transportation (Non-Medical):   Physical Activity:   . Days of Exercise per Week:   . Minutes of Exercise per Session:   Stress:   . Feeling of Stress :   Social Connections:   . Frequency of Communication with Friends and Family:   . Frequency of Social Gatherings with Friends and Family:   . Attends Religious Services:   . Active Member of Clubs or Organizations:   . Attends BankerClub or Organization Meetings:   Marland Kitchen. Marital Status:   Intimate Partner Violence:   . Fear of Current or Ex-Partner:   . Emotionally Abused:   Marland Kitchen. Physically Abused:   . Sexually Abused:     No Known Allergies  Current Outpatient Medications  Medication Sig Dispense Refill  . aspirin EC 81 MG tablet Take 1 tablet (81 mg total) by mouth daily. Swallow whole. 90 tablet 3  . atorvastatin (LIPITOR) 20 MG tablet Take 1 tablet (20 mg total) by mouth daily. 90 tablet 3  . cyclobenzaprine (FLEXERIL) 5 MG tablet 1 tab po q hs as needed neck pain 10 tablet 0  . fluticasone (FLONASE) 50 MCG/ACT nasal spray SPRAY 2 SPRAYS INTO EACH NOSTRIL EVERY DAY 48 mL 1  . lisinopril (ZESTRIL) 5 MG tablet TAKE 1 TABLET BY MOUTH EVERY DAY 30 tablet 0  . albuterol (VENTOLIN HFA) 108 (90 Base) MCG/ACT inhaler Inhale 2 puffs into the lungs every 6 (six) hours as needed. (Patient not taking: Reported on 08/23/2019) 18 g 0  .  amoxicillin (AMOXIL) 500 MG capsule Take 4 tablets (2000 mg) ONE HOUR prior to any dental procedure or cleaning (Patient not taking: Reported on 08/23/2019) 8 capsule 2   No current facility-administered medications for this visit.    REVIEW OF SYSTEMS:  [X]  denotes positive finding, [ ]  denotes negative finding Cardiac  Comments:  Chest pain or chest pressure:    Shortness of breath upon exertion:  Short of breath when lying flat:    Irregular heart rhythm:        Vascular    Pain in calf, thigh, or hip brought on by ambulation:    Pain in feet at night that wakes you up from your sleep:     Blood clot in your veins:    Leg swelling:         Pulmonary    Oxygen at home:    Productive cough:     Wheezing:         Neurologic    Sudden weakness in arms or legs:     Sudden numbness in arms or legs:     Sudden onset of difficulty speaking or slurred speech:    Temporary loss of vision in one eye:     Problems with dizziness:         Gastrointestinal    Blood in stool:     Vomited blood:         Genitourinary    Burning when urinating:     Blood in urine:        Psychiatric    Major depression:         Hematologic    Bleeding problems:    Problems with blood clotting too easily:        Skin    Rashes or ulcers:        Constitutional    Fever or chills:      PHYSICAL EXAM: Vitals:   08/23/19 1548 08/23/19 1552  BP: (!) 147/69 (!) 142/61  Pulse: 72 72  Resp: 18   Temp: (!) 97.5 F (36.4 C)   TempSrc: Temporal   SpO2: 100%   Weight: 209 lb (94.8 kg)   Height: 5\' 11"  (1.803 m)     GENERAL: The patient is a well-nourished male, in no acute distress. The vital signs are documented above. CARDIAC: There is a regular rate and rhythm.  VASCULAR:  No neck incisions from prior surgery PULMONARY: There is good air exchange bilaterally without wheezing or rales. ABDOMEN: Soft and non-tender with normal pitched bowel sounds.  MUSCULOSKELETAL: There are no major  deformities or cyanosis. NEUROLOGIC: No focal weakness or paresthesias are detected.  CN II-XII grossly intact.   DATA:   Last carotid duplex from 01/12/18 showed a high-grade greater than 80% stenosis of the right ICA with a velocity 401/159.  Left ICA with 40 to 59% stenosis.  Assessment/Plan:  74 year old male with high-grade asymptomatic right ICA stenosis now status post TAVR for severe aortic stenosis.  We offered to schedule the surgery last year when he was last evaluated and ultimately he states he got busy and never scheduled surgery.  I got a message from cardiology to get him back into our practice for reevaluation.  His lesion remains asymptomatic.  He has been cleared by cardiology.  Discussed plan to proceed with right carotid endarterectomy for stroke prevention.  I offered to schedule this today and he wants to wait and call back.  I again gave him our office contact information.  States he lives alone and needs to discuss with his family to find some support to stay with him after surgery. Discussed risk and benefits again including risk of stroke, nerve cranial nerve injury, bleeding, infection, 1% risk of perioperative stroke etc.  Needs new carotid duplex given his old duplex is 36.74 years old to ensure carotid still patent on the right.   9, MD  [facility] Office: [phone]  Cephus Shelling

## 2019-08-30 ENCOUNTER — Ambulatory Visit (HOSPITAL_COMMUNITY)
Admission: RE | Admit: 2019-08-30 | Discharge: 2019-08-30 | Disposition: A | Discharge status: Home / Self Care | Payer: PPO | Source: Ambulatory Visit | Attending: Vascular Surgery | Admitting: Vascular Surgery

## 2019-08-30 ENCOUNTER — Other Ambulatory Visit: Payer: Self-pay

## 2019-08-30 DIAGNOSIS — R0600 Dyspnea, unspecified: Secondary | ICD-10-CM | POA: Diagnosis not present

## 2019-09-09 ENCOUNTER — Other Ambulatory Visit: Payer: Self-pay | Admitting: *Deleted

## 2019-09-15 ENCOUNTER — Other Ambulatory Visit: Payer: Self-pay | Admitting: Vascular Surgery

## 2019-09-15 NOTE — Progress Notes (Signed)
[id number] [place], [place] - [address] [place] [address] Phone: [phone] Fax: [phone]      Your procedure is scheduled on Monday 09/19/2019.  Report to [facility] Main Entrance "A" at 05:30 A.M., and check in at the Admitting office.  Call this number if you have problems the morning of surgery:  [phone]  Call [phone] if you have any questions prior to your surgery date Monday-Friday 8am-4pm    Remember:  Do not eat or drink after midnight the night before your surgery   Take these medicines the morning of surgery with A SIP OF: Atorvastatin (Lipitor) Fluticasone (FLonase) nasal spray - if needed  Follow your surgeon's instructions on when to stop Aspirin.  If no instructions were given by your surgeon then you will need to call the office to get those instructions.    As of today, STOP taking any Aleve, Naproxen, Ibuprofen, Motrin, Advil, Goody's, BC's, all herbal medications, fish oil, and all vitamins.                      Do not wear jewelry            Do not wear lotions, powders, colognes, or deodorant.            Men may shave face and neck.            Do not bring valuables to the hospital.            [facility] is not responsible for any belongings or valuables.  Do NOT Smoke (Tobacco/Vaping) or drink Alcohol 24 hours prior to your procedure  If you use a CPAP at night, you may bring all equipment for your overnight stay.   Contacts, glasses, dentures or bridgework may not be worn into surgery.      For patients admitted to the hospital, discharge time will be determined by your treatment team.   Patients discharged the day of surgery will not be allowed to drive home, and someone needs to stay with them for 24 hours.    Special instructions:   [facility]- Preparing For Surgery  Before surgery, you can play an important role. Because skin is not sterile, your skin needs to be as free of germs as  possible. You can reduce the number of germs on your skin by washing with CHG (chlorahexidine gluconate) Soap before surgery.  CHG is an antiseptic cleaner which kills germs and bonds with the skin to continue killing germs even after washing.    Oral Hygiene is also important to reduce your risk of infection.  Remember - BRUSH YOUR TEETH THE MORNING OF SURGERY WITH YOUR REGULAR TOOTHPASTE  Please do not use if you have an allergy to CHG or antibacterial soaps. If your skin becomes reddened/irritated stop using the CHG.  Do not shave (including legs and underarms) for at least 48 hours prior to first CHG shower. It is OK to shave your face.  Please follow these instructions carefully.   1. Shower the NIGHT BEFORE SURGERY and the MORNING OF SURGERY with CHG Soap.   2. If you chose to wash your hair, wash your hair first as usual with your normal shampoo.  3. After you shampoo, rinse your hair and body thoroughly to remove the shampoo.  4. Use CHG as you would any other liquid soap. You can apply CHG directly to the skin and wash gently with a scrungie or a  clean washcloth.   5. Apply the CHG Soap to your body ONLY FROM THE NECK DOWN.  Do not use on open wounds or open sores. Avoid contact with your eyes, ears, mouth and genitals (private parts). Wash Face and genitals (private parts)  with your normal soap.   6. Wash thoroughly, paying special attention to the area where your surgery will be performed.  7. Thoroughly rinse your body with warm water from the neck down.  8. DO NOT shower/wash with your normal soap after using and rinsing off the CHG Soap.  9. Pat yourself dry with a CLEAN TOWEL.  10. Wear CLEAN PAJAMAS to bed the night before surgery  11. Place CLEAN SHEETS on your bed the night of your first shower and DO NOT SLEEP WITH PETS.   Day of Surgery: Shower with CHG soap as directed Wear Clean/Comfortable clothing the morning of surgery Do not apply any deodorants/lotions.    Remember to brush your teeth WITH YOUR REGULAR TOOTHPASTE.   Please read over the following fact sheets that you were given.

## 2019-09-15 NOTE — Progress Notes (Signed)
New consent order for right CEA as office note states.

## 2019-09-16 ENCOUNTER — Other Ambulatory Visit: Payer: Self-pay

## 2019-09-16 ENCOUNTER — Encounter (HOSPITAL_COMMUNITY)
Admission: RE | Admit: 2019-09-16 | Discharge: 2019-09-16 | Disposition: A | Discharge status: Home / Self Care | Payer: PPO | Source: Ambulatory Visit | Attending: Vascular Surgery | Admitting: Vascular Surgery

## 2019-09-16 ENCOUNTER — Other Ambulatory Visit (HOSPITAL_COMMUNITY)
Admission: RE | Admit: 2019-09-16 | Discharge: 2019-09-16 | Disposition: A | Discharge status: Home / Self Care | Payer: PPO | Source: Ambulatory Visit | Attending: Vascular Surgery | Admitting: Vascular Surgery

## 2019-09-16 ENCOUNTER — Other Ambulatory Visit: Payer: Self-pay | Admitting: Medical

## 2019-09-16 ENCOUNTER — Encounter (HOSPITAL_COMMUNITY): Payer: Self-pay

## 2019-09-16 DIAGNOSIS — Z87891 Personal history of nicotine dependence: Secondary | ICD-10-CM | POA: Diagnosis not present

## 2019-09-16 DIAGNOSIS — Z79899 Other long term (current) drug therapy: Secondary | ICD-10-CM | POA: Diagnosis not present

## 2019-09-16 DIAGNOSIS — I739 Peripheral vascular disease, unspecified: Secondary | ICD-10-CM | POA: Diagnosis present

## 2019-09-16 DIAGNOSIS — Z20822 Contact with and (suspected) exposure to covid-19: Secondary | ICD-10-CM | POA: Diagnosis present

## 2019-09-16 DIAGNOSIS — E785 Hyperlipidemia, unspecified: Secondary | ICD-10-CM | POA: Diagnosis present

## 2019-09-16 DIAGNOSIS — Z953 Presence of xenogenic heart valve: Secondary | ICD-10-CM | POA: Diagnosis not present

## 2019-09-16 DIAGNOSIS — J449 Chronic obstructive pulmonary disease, unspecified: Secondary | ICD-10-CM | POA: Diagnosis present

## 2019-09-16 DIAGNOSIS — I2510 Atherosclerotic heart disease of native coronary artery without angina pectoris: Secondary | ICD-10-CM | POA: Diagnosis present

## 2019-09-16 DIAGNOSIS — Z7982 Long term (current) use of aspirin: Secondary | ICD-10-CM | POA: Diagnosis not present

## 2019-09-16 DIAGNOSIS — I10 Essential (primary) hypertension: Secondary | ICD-10-CM | POA: Diagnosis present

## 2019-09-16 DIAGNOSIS — Z01812 Encounter for preprocedural laboratory examination: Secondary | ICD-10-CM | POA: Insufficient documentation

## 2019-09-16 DIAGNOSIS — I959 Hypotension, unspecified: Secondary | ICD-10-CM | POA: Diagnosis not present

## 2019-09-16 DIAGNOSIS — K219 Gastro-esophageal reflux disease without esophagitis: Secondary | ICD-10-CM | POA: Diagnosis present

## 2019-09-16 DIAGNOSIS — I6521 Occlusion and stenosis of right carotid artery: Secondary | ICD-10-CM | POA: Diagnosis present

## 2019-09-16 DIAGNOSIS — E7800 Pure hypercholesterolemia, unspecified: Secondary | ICD-10-CM | POA: Diagnosis present

## 2019-09-16 LAB — CBC
HCT: 42.9 % (ref 39.0–52.0)
Hemoglobin: 13.5 g/dL (ref 13.0–17.0)
MCH: 28.1 pg (ref 26.0–34.0)
MCHC: 31.5 g/dL (ref 30.0–36.0)
MCV: 89.4 fL (ref 80.0–100.0)
Platelets: 208 10*3/uL (ref 150–400)
RBC: 4.8 MIL/uL (ref 4.22–5.81)
RDW: 13.1 % (ref 11.5–15.5)
WBC: 7.3 10*3/uL (ref 4.0–10.5)
nRBC: 0 % (ref 0.0–0.2)

## 2019-09-16 LAB — URINALYSIS, ROUTINE W REFLEX MICROSCOPIC
Bacteria, UA: NONE SEEN
Bilirubin Urine: NEGATIVE
Glucose, UA: NEGATIVE mg/dL
Hgb urine dipstick: NEGATIVE
Ketones, ur: NEGATIVE mg/dL
Nitrite: NEGATIVE
Protein, ur: NEGATIVE mg/dL
Specific Gravity, Urine: 1.025 (ref 1.005–1.030)
pH: 5 (ref 5.0–8.0)

## 2019-09-16 LAB — COMPREHENSIVE METABOLIC PANEL
ALT: 16 U/L (ref 0–44)
AST: 19 U/L (ref 15–41)
Albumin: 4.3 g/dL (ref 3.5–5.0)
Alkaline Phosphatase: 63 U/L (ref 38–126)
Anion gap: 9 (ref 5–15)
BUN: 13 mg/dL (ref 8–23)
CO2: 24 mmol/L (ref 22–32)
Calcium: 9 mg/dL (ref 8.9–10.3)
Chloride: 107 mmol/L (ref 98–111)
Creatinine, Ser: 1.07 mg/dL (ref 0.61–1.24)
GFR calc Af Amer: >60 mL/min (ref >60)
GFR calc non Af Amer: >60 mL/min (ref >60)
Glucose, Bld: 95 mg/dL (ref 70–99)
Potassium: 3.9 mmol/L (ref 3.5–5.1)
Sodium: 140 mmol/L (ref 135–145)
Total Bilirubin: 1.3 mg/dL — ABNORMAL HIGH (ref 0.3–1.2)
Total Protein: 6.7 g/dL (ref 6.5–8.1)

## 2019-09-16 LAB — SURGICAL PCR SCREEN
MRSA, PCR: NEGATIVE
Staphylococcus aureus: POSITIVE — AB

## 2019-09-16 LAB — SARS CORONAVIRUS 2 (TAT 6-24 HRS): SARS Coronavirus 2: NEGATIVE

## 2019-09-16 LAB — APTT: aPTT: 27 seconds (ref 24–36)

## 2019-09-16 LAB — PROTIME-INR
INR: 1.1 (ref 0.8–1.2)
Prothrombin Time: 13.5 seconds (ref 11.4–15.2)

## 2019-09-16 NOTE — Progress Notes (Signed)
PCP - [name] Cardiologist - Dr. Elease Hashimoto  PPM/ICD - n/a Device Orders -  Rep Notified -   Chest x-ray - 12/15/2018 EKG - 01/26/2019 Stress Test - 08/03/2019 ECHO - 08/11/2019 Cardiac Cath - 02/04/2018  Sleep Study - n/a CPAP -   Fasting Blood Sugar - n/a Checks Blood Sugar _____ times a day  Blood Thinner Instructions: Aspirin Instructions: continue, hold day of surgery  ERAS Protcol - n/a PRE-SURGERY Ensure or G2-   COVID TEST- after PAT appointment   Anesthesia review: yes, cardiac history  Patient denies shortness of breath, fever, cough and chest pain at PAT appointment   All instructions explained to the patient, with a verbal understanding of the material. Patient agrees to go over the instructions while at home for a better understanding. Patient also instructed to self quarantine after being tested for COVID-19. The opportunity to ask questions was provided.

## 2019-09-16 NOTE — Progress Notes (Signed)
Anesthesia Chart Review:   Case: [id number] Date/Time: 09/19/19 0715   Procedure: RIGHT ENDARTERECTOMY CAROTID (Right )   Anesthesia type: Choice   Pre-op diagnosis: RIGHT CAROTID STENOSIS   [facility]: [facility] ROOM 12 / [facility] OR   Surgeons: Cephus Shelling, MD      DISCUSSION:  Pt is 74 years old with hx aortic stenosis (s/p TAVR 02/23/18), CAD, HTN, carotid artery disease, pulmonary nodules (benign per 01/26/19)   VS: BP 108/68   Pulse 65   Temp 36.9 C (Oral)   Resp 18   Ht 5\' 11"  (1.803 m)   Wt 93.4 kg   SpO2 98%   BMI 28.73 kg/m    PROVIDERS: - PCP is - Cardiologist is Marisue Brooklyn, MD - Sees cardiologist Kristeen Miss, MD for TAVR. Last office visit 07/27/19 with 07/29/19, PA who cleared pt for surgery in comment on 08/11/19 echo report   LABS: Labs reviewed: Acceptable for surgery. (all labs ordered are listed, but only abnormal results are displayed)  Labs Reviewed  COMPREHENSIVE METABOLIC PANEL - Abnormal; Notable for the following components:      Result Value   Total Bilirubin 1.3 (*)    All other components within normal limits  URINALYSIS, ROUTINE W REFLEX MICROSCOPIC - Abnormal; Notable for the following components:   APPearance HAZY (*)    Leukocytes,Ua TRACE (*)    All other components within normal limits  SURGICAL PCR SCREEN  APTT  CBC  PROTIME-INR     IMAGES: CT chest 01/26/19:  1. Multiple small 2-5 mm pulmonary nodules in the lungs bilaterally, similar in size, number and distribution to the prior study, considered definitively benign requiring no future imaging follow-up. 2. Aortic atherosclerosis, in addition to left main and 3 vessel coronary artery disease. Assessment for potential risk factor modification, dietary therapy or pharmacologic therapy may be warranted, if clinically indicated. 3. Cholelithiasis without evidence of acute cholecystitis at this time. 4. Status post TAVR   EKG 01/26/19: NSR. T wave  inversions in aVL and V6   CV: Carotid duplex 08/30/19:  - Right Carotid: Velocities in the right ICA are consistent with a 80-99% stenosis.  - Left Carotid: Velocities in the left ICA are consistent with a 40-59%  stenosis.  - Vertebrals: Bilateral vertebral arteries demonstrate antegrade flow.  - Subclavians: Normal flow hemodynamics were seen in bilateral subclavian arteries.    Echo 08/11/19:  1. The aortic valve has been repaired/replaced. Aortic valve regurgitation mild, posterior and appears paravalvular and appears stable since last exam. No aortic stenosis is present. There is a 26 mm Edwards Sapien prosthetic (TAVR) valve present in the aortic position. Procedure Date: 02/23/18. Aortic valve mean gradient measures 7.0 mmHg.  2. Left ventricular ejection fraction, by estimation, is 65 to 70%. The  left ventricle has normal function. The left ventricle has no regional  wall motion abnormalities. Left ventricular diastolic parameters were  normal.  3. Right ventricular systolic function is normal. The right ventricular  size is normal. Tricuspid regurgitation signal is inadequate for assessing  PA pressure.  4. The mitral valve is normal in structure. Trivial mitral valve regurgitation. No evidence of mitral stenosis.   Nuclear stress test 08/03/19:   The left ventricular ejection fraction is normal (55-65%).  Nuclear stress EF: 64%.  There was no ST segment deviation noted during stress.  There is a small defect of mild severity present in the basal inferior and basal inferolateral location. The defect is non-reversible.  This is likely related to diaphragmatic attenuation artifact. No ischemia noted.  This is a low risk study.   Cardiac cath 02/04/18:   Ost LM lesion is 30% stenosed.  Dist LM lesion is 30% stenosed.  Prox LAD lesion is 40% stenosed.  Ost Cx to Prox Cx lesion is 50% stenosed.  Mid RCA lesion is 80% stenosed.  Dist RCA-1 lesion is 95% stenosed.  Dist  RCA-2 lesion is 80% stenosed.  Ost RPDA to RPDA lesion is 80% stenosed. 1. Severe single vessel CAD with severe diffuse stenosis of the RCA and left-to-right collaterals supplying the PDA and PLA branches 2. Moderate ostial circumflex stenosis 3. Nonobstructive LAD stenosis 4. Normal right heart pressures    Past Medical History:  Diagnosis Date  . Abnormal liver function test   . Acute bronchitis   . Carotid artery occlusion    Bilateral Bruit  . Cataract   . Coronary artery disease involving native coronary artery of native heart without angina pectoris   . DJD (degenerative joint disease)   . Dyspnea   . GERD (gastroesophageal reflux disease)   . Heart murmur   . History of UTI   . Hypercholesteremia   . Hypertension   . Lumbar back pain   . Overweight(278.02)   . Pulmonary nodules    Multiple small pulmonary nodules scattered throughout both lungs, 12 month CT rec if pt high risk  . S/P TAVR (transcatheter aortic valve replacement)    26 mm Edwards Sapien 3 transcatheter heart valve placed via percutaneous right transfemoral approach   . Severe aortic stenosis   . Severe aortic stenosis     Past Surgical History:  Procedure Laterality Date  . APPENDECTOMY    . COLONOSCOPY    . CORONARY ANGIOGRAPHY N/A 02/04/2018   Procedure: CORONARY ANGIOGRAPHY;  Surgeon: Tonny Bollman, MD;  Location: [facility] INVASIVE CV LAB;  Service: Cardiovascular;  Laterality: N/A;  . RIGHT HEART CATH N/A 02/04/2018   Procedure: RIGHT HEART CATH;  Surgeon: Tonny Bollman, MD;  Location: [facility] INVASIVE CV LAB;  Service: Cardiovascular;  Laterality: N/A;  . TEE WITHOUT CARDIOVERSION N/A 02/23/2018   Procedure: TRANSESOPHAGEAL ECHOCARDIOGRAM (TEE);  Surgeon: Tonny Bollman, MD;  Location: [facility] INVASIVE CV LAB;  Service: Open Heart Surgery;  Laterality: N/A;  . TRANSCATHETER AORTIC VALVE REPLACEMENT, TRANSFEMORAL  02/23/2018  . TRANSCATHETER AORTIC VALVE REPLACEMENT, TRANSFEMORAL N/A 02/23/2018   Procedure:  TRANSCATHETER AORTIC VALVE REPLACEMENT, TRANSFEMORAL;  Surgeon: Tonny Bollman, MD;  Location: [facility] INVASIVE CV LAB;  Service: Open Heart Surgery;  Laterality: N/A;    MEDICATIONS: . albuterol (VENTOLIN HFA) 108 (90 Base) MCG/ACT inhaler  . amoxicillin (AMOXIL) 500 MG capsule  . aspirin EC 81 MG tablet  . atorvastatin (LIPITOR) 20 MG tablet  . cyclobenzaprine (FLEXERIL) 5 MG tablet  . fluticasone (FLONASE) 50 MCG/ACT nasal spray  . lisinopril (ZESTRIL) 5 MG tablet   No current facility-administered medications for this encounter.   If no changes, I anticipate pt can proceed with surgery as scheduled.   Rica Mast, PhD, FNP-BC [facility]/Anesthesiology Phone: [phone] 09/16/2019 2:35 PM

## 2019-09-18 NOTE — Anesthesia Preprocedure Evaluation (Addendum)
Anesthesia Evaluation  Patient identified by MRN, date of birth, ID band Patient awake    Reviewed: Allergy & Precautions, NPO status , Patient's Chart, lab work & pertinent test results  History of Anesthesia Complications Negative for: history of anesthetic complications  Airway Mallampati: II  TM Distance: >3 FB Neck ROM: Full    Dental  (+) Edentulous Upper, Missing, Dental Advisory Given   Pulmonary COPD,  COPD inhaler, former smoker,  09/16/2019 SARS coronavirus neg   breath sounds clear to auscultation       Cardiovascular hypertension, Pt. on medications + CAD and + Peripheral Vascular Disease  + Valvular Problems/Murmurs (now s/p TAVR) AS  Rhythm:Regular Rate:Normal  08/12/2019 ECHO: EF 65-70%, normally functioning TAVR with mean grad 7 mmHg  07/15/2019 Stress: low risk with no ischemia  '20 Cath: single vessel RCA disease, fills by collaterals   Neuro/Psych negative neurological ROS     GI/Hepatic Neg liver ROS, GERD  Controlled,  Endo/Other  negative endocrine ROS  Renal/GU negative Renal ROS     Musculoskeletal   Abdominal   Peds  Hematology negative hematology ROS (+)   Anesthesia Other Findings   Reproductive/Obstetrics                            Anesthesia Physical Anesthesia Plan  ASA: III  Anesthesia Plan: General   Post-op Pain Management:    Induction: Intravenous  PONV Risk Score and Plan: 2 and Ondansetron and Dexamethasone  Airway Management Planned: Oral ETT  Additional Equipment: Arterial line  Intra-op Plan:   Post-operative Plan: Extubation in OR  Informed Consent: I have reviewed the patients History and Physical, chart, labs and discussed the procedure including the risks, benefits and alternatives for the proposed anesthesia with the patient or authorized representative who has indicated his/her understanding and acceptance.     Dental advisory  given  Plan Discussed with: CRNA and Surgeon  Anesthesia Plan Comments:        Anesthesia Quick Evaluation

## 2019-09-19 ENCOUNTER — Inpatient Hospital Stay (HOSPITAL_COMMUNITY)
Admission: RE | Admit: 2019-09-19 | Discharge: 2019-09-20 | DRG: 039 | Disposition: A | Discharge status: Home / Self Care | Payer: PPO | Attending: Vascular Surgery | Admitting: Vascular Surgery

## 2019-09-19 ENCOUNTER — Inpatient Hospital Stay (HOSPITAL_COMMUNITY): Payer: PPO | Admitting: Emergency Medicine

## 2019-09-19 ENCOUNTER — Other Ambulatory Visit: Payer: Self-pay

## 2019-09-19 ENCOUNTER — Encounter (HOSPITAL_COMMUNITY)
Admission: RE | Disposition: A | Discharge status: Home / Self Care | Payer: Self-pay | Source: Home / Self Care | Attending: Vascular Surgery

## 2019-09-19 ENCOUNTER — Encounter (HOSPITAL_COMMUNITY): Payer: Self-pay | Admitting: Vascular Surgery

## 2019-09-19 ENCOUNTER — Inpatient Hospital Stay (HOSPITAL_COMMUNITY): Payer: PPO | Admitting: Anesthesiology

## 2019-09-19 DIAGNOSIS — E785 Hyperlipidemia, unspecified: Secondary | ICD-10-CM | POA: Diagnosis present

## 2019-09-19 DIAGNOSIS — J449 Chronic obstructive pulmonary disease, unspecified: Secondary | ICD-10-CM | POA: Diagnosis present

## 2019-09-19 DIAGNOSIS — K219 Gastro-esophageal reflux disease without esophagitis: Secondary | ICD-10-CM | POA: Diagnosis present

## 2019-09-19 DIAGNOSIS — I739 Peripheral vascular disease, unspecified: Secondary | ICD-10-CM | POA: Diagnosis present

## 2019-09-19 DIAGNOSIS — I959 Hypotension, unspecified: Secondary | ICD-10-CM | POA: Diagnosis not present

## 2019-09-19 DIAGNOSIS — E7800 Pure hypercholesterolemia, unspecified: Secondary | ICD-10-CM | POA: Diagnosis present

## 2019-09-19 DIAGNOSIS — Z20822 Contact with and (suspected) exposure to covid-19: Secondary | ICD-10-CM | POA: Diagnosis present

## 2019-09-19 DIAGNOSIS — Z953 Presence of xenogenic heart valve: Secondary | ICD-10-CM

## 2019-09-19 DIAGNOSIS — Z87891 Personal history of nicotine dependence: Secondary | ICD-10-CM | POA: Diagnosis not present

## 2019-09-19 DIAGNOSIS — I6521 Occlusion and stenosis of right carotid artery: Secondary | ICD-10-CM

## 2019-09-19 DIAGNOSIS — I10 Essential (primary) hypertension: Secondary | ICD-10-CM | POA: Diagnosis present

## 2019-09-19 DIAGNOSIS — Z79899 Other long term (current) drug therapy: Secondary | ICD-10-CM | POA: Diagnosis not present

## 2019-09-19 DIAGNOSIS — Z7982 Long term (current) use of aspirin: Secondary | ICD-10-CM | POA: Diagnosis not present

## 2019-09-19 DIAGNOSIS — I2510 Atherosclerotic heart disease of native coronary artery without angina pectoris: Secondary | ICD-10-CM | POA: Diagnosis present

## 2019-09-19 HISTORY — PX: PATCH ANGIOPLASTY: SHX6230

## 2019-09-19 HISTORY — PX: CAROTID ENDARTERECTOMY: SUR193

## 2019-09-19 HISTORY — PX: ENDARTERECTOMY: SHX5162

## 2019-09-19 LAB — TYPE AND SCREEN
ABO/RH(D): A POS
Antibody Screen: NEGATIVE

## 2019-09-19 LAB — POCT ACTIVATED CLOTTING TIME: Activated Clotting Time: 268 seconds

## 2019-09-19 SURGERY — ENDARTERECTOMY, CAROTID
Anesthesia: General | Laterality: Right

## 2019-09-19 MED ORDER — ASPIRIN EC 81 MG PO TBEC
81.0000 mg | DELAYED_RELEASE_TABLET | Freq: Every day | ORAL | Status: DC
  Administered 2019-09-20: 81 mg via ORAL
  Filled 2019-09-19 (×2): qty 1

## 2019-09-19 MED ORDER — ROCURONIUM BROMIDE 10 MG/ML (PF) SYRINGE
PREFILLED_SYRINGE | INTRAVENOUS | Status: AC
  Filled 2019-09-19: qty 10

## 2019-09-19 MED ORDER — MORPHINE SULFATE (PF) 2 MG/ML IV SOLN
2.0000 mg | INTRAVENOUS | Status: DC | PRN
  Administered 2019-09-19: 2 mg via INTRAVENOUS
  Filled 2019-09-19: qty 1

## 2019-09-19 MED ORDER — ROCURONIUM BROMIDE 10 MG/ML (PF) SYRINGE
PREFILLED_SYRINGE | INTRAVENOUS | Status: DC | PRN
  Administered 2019-09-19: 60 mg via INTRAVENOUS
  Administered 2019-09-19: 20 mg via INTRAVENOUS

## 2019-09-19 MED ORDER — HEPARIN SODIUM (PORCINE) 1000 UNIT/ML IJ SOLN
INTRAMUSCULAR | Status: DC | PRN
  Administered 2019-09-19: 10000 [IU] via INTRAVENOUS

## 2019-09-19 MED ORDER — ALBUMIN HUMAN 5 % IV SOLN
INTRAVENOUS | Status: AC
  Filled 2019-09-19: qty 250

## 2019-09-19 MED ORDER — PHENOL 1.4 % MT LIQD: 1.0000 | OROMUCOSAL | Status: DC | PRN

## 2019-09-19 MED ORDER — FENTANYL CITRATE (PF) 100 MCG/2ML IJ SOLN
INTRAMUSCULAR | Status: AC
  Filled 2019-09-19: qty 2

## 2019-09-19 MED ORDER — CEFAZOLIN SODIUM-DEXTROSE 2-4 GM/100ML-% IV SOLN
INTRAVENOUS | Status: AC
  Filled 2019-09-19: qty 100

## 2019-09-19 MED ORDER — ALBUMIN HUMAN 5 % IV SOLN
12.5000 g | Freq: Once | INTRAVENOUS | Status: AC
  Administered 2019-09-19: 12.5 g via INTRAVENOUS

## 2019-09-19 MED ORDER — ONDANSETRON HCL 4 MG/2ML IJ SOLN
INTRAMUSCULAR | Status: DC | PRN
  Administered 2019-09-19: 4 mg via INTRAVENOUS

## 2019-09-19 MED ORDER — DEXMEDETOMIDINE (PRECEDEX) IN NS 20 MCG/5ML (4 MCG/ML) IV SYRINGE
PREFILLED_SYRINGE | INTRAVENOUS | Status: DC | PRN
  Administered 2019-09-19 (×3): 4 ug via INTRAVENOUS

## 2019-09-19 MED ORDER — PROPOFOL 10 MG/ML IV BOLUS
INTRAVENOUS | Status: AC
  Filled 2019-09-19: qty 20

## 2019-09-19 MED ORDER — PROPOFOL 10 MG/ML IV BOLUS
INTRAVENOUS | Status: DC | PRN
  Administered 2019-09-19: 10 mg via INTRAVENOUS
  Administered 2019-09-19: 100 mg via INTRAVENOUS
  Administered 2019-09-19: 30 mg via INTRAVENOUS

## 2019-09-19 MED ORDER — PROTAMINE SULFATE 10 MG/ML IV SOLN
INTRAVENOUS | Status: DC | PRN
  Administered 2019-09-19: 50 mg via INTRAVENOUS

## 2019-09-19 MED ORDER — SODIUM CHLORIDE 0.9 % IV SOLN: INTRAVENOUS | Status: DC

## 2019-09-19 MED ORDER — CHLORHEXIDINE GLUCONATE 0.12 % MT SOLN
OROMUCOSAL | Status: AC
  Administered 2019-09-19: 15 mL
  Filled 2019-09-19: qty 15

## 2019-09-19 MED ORDER — CEFAZOLIN SODIUM-DEXTROSE 2-4 GM/100ML-% IV SOLN
2.0000 g | Freq: Three times a day (TID) | INTRAVENOUS | Status: AC
  Administered 2019-09-19 – 2019-09-20 (×2): 2 g via INTRAVENOUS
  Filled 2019-09-19 (×2): qty 100

## 2019-09-19 MED ORDER — EPHEDRINE SULFATE-NACL 50-0.9 MG/10ML-% IV SOSY
PREFILLED_SYRINGE | INTRAVENOUS | Status: DC | PRN
  Administered 2019-09-19 (×2): 5 mg via INTRAVENOUS

## 2019-09-19 MED ORDER — 0.9 % SODIUM CHLORIDE (POUR BTL) OPTIME
TOPICAL | Status: DC | PRN
  Administered 2019-09-19: 2000 mL

## 2019-09-19 MED ORDER — GUAIFENESIN-DM 100-10 MG/5ML PO SYRP: 15.0000 mL | ORAL_SOLUTION | ORAL | Status: DC | PRN

## 2019-09-19 MED ORDER — SUGAMMADEX SODIUM 200 MG/2ML IV SOLN
INTRAVENOUS | Status: DC | PRN
  Administered 2019-09-19: 200 mg via INTRAVENOUS

## 2019-09-19 MED ORDER — MEPERIDINE HCL 25 MG/ML IJ SOLN: 6.2500 mg | INTRAMUSCULAR | Status: DC | PRN

## 2019-09-19 MED ORDER — ESMOLOL HCL 100 MG/10ML IV SOLN
INTRAVENOUS | Status: AC
  Filled 2019-09-19: qty 10

## 2019-09-19 MED ORDER — PHENYLEPHRINE HCL-NACL 10-0.9 MG/250ML-% IV SOLN
INTRAVENOUS | Status: DC | PRN
  Administered 2019-09-19: 25 ug/min via INTRAVENOUS

## 2019-09-19 MED ORDER — LIDOCAINE 2% (20 MG/ML) 5 ML SYRINGE
INTRAMUSCULAR | Status: DC | PRN
  Administered 2019-09-19: 30 mg via INTRAVENOUS

## 2019-09-19 MED ORDER — DOPAMINE-DEXTROSE 3.2-5 MG/ML-% IV SOLN
INTRAVENOUS | Status: AC
  Administered 2019-09-19: 800 mg
  Filled 2019-09-19: qty 250

## 2019-09-19 MED ORDER — CEFAZOLIN SODIUM-DEXTROSE 2-4 GM/100ML-% IV SOLN
2.0000 g | INTRAVENOUS | Status: AC
  Administered 2019-09-19: 2 g via INTRAVENOUS

## 2019-09-19 MED ORDER — SODIUM CHLORIDE 0.9 % IV SOLN
INTRAVENOUS | Status: AC
  Filled 2019-09-19: qty 1.2

## 2019-09-19 MED ORDER — BISACODYL 10 MG RE SUPP: 10.0000 mg | Freq: Every day | RECTAL | Status: DC | PRN

## 2019-09-19 MED ORDER — LISINOPRIL 5 MG PO TABS
5.0000 mg | ORAL_TABLET | Freq: Every day | ORAL | Status: DC
  Administered 2019-09-20: 5 mg via ORAL
  Filled 2019-09-19: qty 1

## 2019-09-19 MED ORDER — SODIUM CHLORIDE 0.9 % IV SOLN
INTRAVENOUS | Status: DC | PRN
  Administered 2019-09-19: 08:00:00 500 mL

## 2019-09-19 MED ORDER — ACETAMINOPHEN 650 MG RE SUPP: 325.0000 mg | RECTAL | Status: DC | PRN

## 2019-09-19 MED ORDER — DOPAMINE-DEXTROSE 3.2-5 MG/ML-% IV SOLN
2.0000 ug/kg/min | INTRAVENOUS | Status: DC
  Administered 2019-09-19: 3 ug/kg/min via INTRAVENOUS

## 2019-09-19 MED ORDER — PROMETHAZINE HCL 25 MG/ML IJ SOLN: 6.2500 mg | INTRAMUSCULAR | Status: DC | PRN

## 2019-09-19 MED ORDER — ONDANSETRON HCL 4 MG/2ML IJ SOLN
INTRAMUSCULAR | Status: AC
  Filled 2019-09-19: qty 2

## 2019-09-19 MED ORDER — MIDAZOLAM HCL 2 MG/2ML IJ SOLN: 0.5000 mg | Freq: Once | INTRAMUSCULAR | Status: DC | PRN

## 2019-09-19 MED ORDER — ALBUTEROL SULFATE (2.5 MG/3ML) 0.083% IN NEBU: 2.5000 mg | INHALATION_SOLUTION | Freq: Four times a day (QID) | RESPIRATORY_TRACT | Status: DC | PRN

## 2019-09-19 MED ORDER — DOCUSATE SODIUM 100 MG PO CAPS
100.0000 mg | ORAL_CAPSULE | Freq: Every day | ORAL | Status: DC
  Filled 2019-09-19: qty 1

## 2019-09-19 MED ORDER — METOPROLOL TARTRATE 5 MG/5ML IV SOLN: 2.0000 mg | INTRAVENOUS | Status: DC | PRN

## 2019-09-19 MED ORDER — ACETAMINOPHEN 325 MG PO TABS: 325.0000 mg | ORAL_TABLET | ORAL | Status: DC | PRN

## 2019-09-19 MED ORDER — DEXAMETHASONE SODIUM PHOSPHATE 10 MG/ML IJ SOLN
INTRAMUSCULAR | Status: DC | PRN
  Administered 2019-09-19: 5 mg via INTRAVENOUS

## 2019-09-19 MED ORDER — ALBUTEROL SULFATE HFA 108 (90 BASE) MCG/ACT IN AERS: 2.0000 | INHALATION_SPRAY | Freq: Four times a day (QID) | RESPIRATORY_TRACT | Status: DC | PRN

## 2019-09-19 MED ORDER — ORAL CARE MOUTH RINSE: 15.0000 mL | Freq: Once | OROMUCOSAL | Status: AC

## 2019-09-19 MED ORDER — SODIUM CHLORIDE 0.9 % IV SOLN: 500.0000 mL | Freq: Once | INTRAVENOUS | Status: DC | PRN

## 2019-09-19 MED ORDER — DEXAMETHASONE SODIUM PHOSPHATE 10 MG/ML IJ SOLN
INTRAMUSCULAR | Status: AC
  Filled 2019-09-19: qty 1

## 2019-09-19 MED ORDER — PHENYLEPHRINE 40 MCG/ML (10ML) SYRINGE FOR IV PUSH (FOR BLOOD PRESSURE SUPPORT)
PREFILLED_SYRINGE | INTRAVENOUS | Status: DC | PRN
  Administered 2019-09-19 (×2): 40 ug via INTRAVENOUS
  Administered 2019-09-19: 120 ug via INTRAVENOUS
  Administered 2019-09-19: 40 ug via INTRAVENOUS
  Administered 2019-09-19: 80 ug via INTRAVENOUS

## 2019-09-19 MED ORDER — PANTOPRAZOLE SODIUM 40 MG PO TBEC
40.0000 mg | DELAYED_RELEASE_TABLET | Freq: Every day | ORAL | Status: DC
  Administered 2019-09-20: 40 mg via ORAL
  Filled 2019-09-19: qty 1

## 2019-09-19 MED ORDER — ONDANSETRON HCL 4 MG/2ML IJ SOLN: 4.0000 mg | Freq: Four times a day (QID) | INTRAMUSCULAR | Status: DC | PRN

## 2019-09-19 MED ORDER — PHENYLEPHRINE 40 MCG/ML (10ML) SYRINGE FOR IV PUSH (FOR BLOOD PRESSURE SUPPORT)
PREFILLED_SYRINGE | INTRAVENOUS | Status: AC
  Filled 2019-09-19: qty 10

## 2019-09-19 MED ORDER — CHLORHEXIDINE GLUCONATE CLOTH 2 % EX PADS: 6.0000 | MEDICATED_PAD | Freq: Once | CUTANEOUS | Status: DC

## 2019-09-19 MED ORDER — OXYCODONE-ACETAMINOPHEN 5-325 MG PO TABS
1.0000 | ORAL_TABLET | ORAL | Status: DC | PRN
  Administered 2019-09-19: 2 via ORAL
  Administered 2019-09-19 – 2019-09-20 (×2): 1 via ORAL
  Filled 2019-09-19: qty 1
  Filled 2019-09-19: qty 2
  Filled 2019-09-19: qty 1

## 2019-09-19 MED ORDER — ATORVASTATIN CALCIUM 10 MG PO TABS
20.0000 mg | ORAL_TABLET | Freq: Every day | ORAL | Status: DC
  Administered 2019-09-20: 20 mg via ORAL
  Filled 2019-09-19 (×2): qty 2

## 2019-09-19 MED ORDER — ESMOLOL HCL 100 MG/10ML IV SOLN
INTRAVENOUS | Status: DC | PRN
  Administered 2019-09-19: 30 mg via INTRAVENOUS

## 2019-09-19 MED ORDER — MAGNESIUM SULFATE 2 GM/50ML IV SOLN: 2.0000 g | Freq: Every day | INTRAVENOUS | Status: DC | PRN

## 2019-09-19 MED ORDER — HYDRALAZINE HCL 20 MG/ML IJ SOLN: 5.0000 mg | INTRAMUSCULAR | Status: DC | PRN

## 2019-09-19 MED ORDER — FENTANYL CITRATE (PF) 100 MCG/2ML IJ SOLN
25.0000 ug | INTRAMUSCULAR | Status: DC | PRN
  Administered 2019-09-19 (×2): 25 ug via INTRAVENOUS

## 2019-09-19 MED ORDER — POTASSIUM CHLORIDE CRYS ER 20 MEQ PO TBCR: 20.0000 meq | EXTENDED_RELEASE_TABLET | Freq: Every day | ORAL | Status: DC | PRN

## 2019-09-19 MED ORDER — LACTATED RINGERS IV SOLN: INTRAVENOUS | Status: DC

## 2019-09-19 MED ORDER — LIDOCAINE 2% (20 MG/ML) 5 ML SYRINGE
INTRAMUSCULAR | Status: AC
  Filled 2019-09-19: qty 5

## 2019-09-19 MED ORDER — LABETALOL HCL 5 MG/ML IV SOLN: 10.0000 mg | INTRAVENOUS | Status: DC | PRN

## 2019-09-19 MED ORDER — FENTANYL CITRATE (PF) 100 MCG/2ML IJ SOLN
INTRAMUSCULAR | Status: DC | PRN
Start: 2019-09-19 — End: 2019-09-19
  Administered 2019-09-19: 250 ug via INTRAVENOUS

## 2019-09-19 MED ORDER — FENTANYL CITRATE (PF) 250 MCG/5ML IJ SOLN
INTRAMUSCULAR | Status: AC
  Filled 2019-09-19: qty 5

## 2019-09-19 MED ORDER — HEMOSTATIC AGENTS (NO CHARGE) OPTIME
TOPICAL | Status: DC | PRN
  Administered 2019-09-19 (×2): 1 via TOPICAL

## 2019-09-19 MED ORDER — LIDOCAINE HCL (PF) 1 % IJ SOLN
INTRAMUSCULAR | Status: DC | PRN
  Administered 2019-09-19 (×2): 1 mL

## 2019-09-19 MED ORDER — ALUM & MAG HYDROXIDE-SIMETH 200-200-20 MG/5ML PO SUSP: 15.0000 mL | ORAL | Status: DC | PRN

## 2019-09-19 MED ORDER — POLYETHYLENE GLYCOL 3350 17 G PO PACK: 17.0000 g | PACK | Freq: Every day | ORAL | Status: DC | PRN

## 2019-09-19 MED ORDER — LIDOCAINE HCL (PF) 1 % IJ SOLN
INTRAMUSCULAR | Status: AC
  Filled 2019-09-19: qty 30

## 2019-09-19 MED ORDER — CHLORHEXIDINE GLUCONATE 0.12 % MT SOLN: 15.0000 mL | Freq: Once | OROMUCOSAL | Status: AC

## 2019-09-19 SURGICAL SUPPLY — 57 items
ADH SKN CLS APL DERMABOND .7 (GAUZE/BANDAGES/DRESSINGS) ×1
ADPR TBG 2 MALE LL ART (MISCELLANEOUS)
CANISTER SUCT 3000ML PPV (MISCELLANEOUS) ×3 IMPLANT
CATH ROBINSON RED A/P 18FR (CATHETERS) ×3 IMPLANT
CLIP VESOCCLUDE MED 24/CT (CLIP) ×3 IMPLANT
CLIP VESOCCLUDE SM WIDE 24/CT (CLIP) ×3 IMPLANT
COVER PROBE W GEL 5X96 (DRAPES) ×2 IMPLANT
COVER TRANSDUCER ULTRASND GEL (DISPOSABLE) ×1 IMPLANT
COVER WAND RF STERILE (DRAPES) ×1 IMPLANT
DERMABOND ADVANCED (GAUZE/BANDAGES/DRESSINGS) ×2
DERMABOND ADVANCED .7 DNX12 (GAUZE/BANDAGES/DRESSINGS) ×1 IMPLANT
DRAIN CHANNEL 15F RND FF W/TCR (WOUND CARE) IMPLANT
ELECT REM PT RETURN 9FT ADLT (ELECTROSURGICAL) ×3
ELECTRODE REM PT RTRN 9FT ADLT (ELECTROSURGICAL) ×1 IMPLANT
EVACUATOR SILICONE 100CC (DRAIN) IMPLANT
GAUZE 4X4 16PLY RFD (DISPOSABLE) ×2 IMPLANT
GLOVE BIO SURGEON STRL SZ7.5 (GLOVE) ×3 IMPLANT
GLOVE BIOGEL PI IND STRL 8 (GLOVE) ×1 IMPLANT
GLOVE BIOGEL PI INDICATOR 8 (GLOVE) ×2
GOWN STRL REUS W/ TWL LRG LVL3 (GOWN DISPOSABLE) ×2 IMPLANT
GOWN STRL REUS W/ TWL XL LVL3 (GOWN DISPOSABLE) ×2 IMPLANT
GOWN STRL REUS W/TWL LRG LVL3 (GOWN DISPOSABLE) ×6
GOWN STRL REUS W/TWL XL LVL3 (GOWN DISPOSABLE) ×3
HEMOSTAT SNOW SURGICEL 2X4 (HEMOSTASIS) ×4 IMPLANT
IV ADAPTER SYR DOUBLE MALE LL (MISCELLANEOUS) IMPLANT
KIT BASIN OR (CUSTOM PROCEDURE TRAY) ×3 IMPLANT
KIT SHUNT ARGYLE CAROTID ART 6 (VASCULAR PRODUCTS) IMPLANT
KIT TURNOVER KIT B (KITS) ×3 IMPLANT
LOOP VESSEL MINI RED (MISCELLANEOUS) IMPLANT
NDL HYPO 25GX1X1/2 BEV (NEEDLE) IMPLANT
NDL SPNL 20GX3.5 QUINCKE YW (NEEDLE) IMPLANT
NEEDLE HYPO 25GX1X1/2 BEV (NEEDLE) ×3 IMPLANT
NEEDLE SPNL 20GX3.5 QUINCKE YW (NEEDLE) IMPLANT
NS IRRIG 1000ML POUR BTL (IV SOLUTION) ×9 IMPLANT
PACK CAROTID (CUSTOM PROCEDURE TRAY) ×3 IMPLANT
PAD ARMBOARD 7.5X6 YLW CONV (MISCELLANEOUS) ×6 IMPLANT
PATCH VASC XENOSURE 1CMX6CM (Vascular Products) ×3 IMPLANT
PATCH VASC XENOSURE 1X6 (Vascular Products) IMPLANT
POSITIONER HEAD DONUT 9IN (MISCELLANEOUS) ×3 IMPLANT
SHUNT CAROTID BYPASS 10 (VASCULAR PRODUCTS) ×2 IMPLANT
SHUNT CAROTID BYPASS 12FRX15.5 (VASCULAR PRODUCTS) IMPLANT
SPONGE SURGIFOAM ABS GEL 100 (HEMOSTASIS) IMPLANT
STOPCOCK 4 WAY LG BORE MALE ST (IV SETS) IMPLANT
SUT ETHILON 3 0 PS 1 (SUTURE) IMPLANT
SUT MNCRL AB 4-0 PS2 18 (SUTURE) ×3 IMPLANT
SUT PROLENE 5 0 C 1 24 (SUTURE) ×3 IMPLANT
SUT PROLENE 6 0 BV (SUTURE) ×9 IMPLANT
SUT PROLENE 7 0 BV 1 (SUTURE) ×4 IMPLANT
SUT SILK 3 0 (SUTURE)
SUT SILK 3-0 18XBRD TIE 12 (SUTURE) IMPLANT
SUT VIC AB 3-0 SH 27 (SUTURE) ×3
SUT VIC AB 3-0 SH 27X BRD (SUTURE) ×1 IMPLANT
SYR BULB IRRIG 60ML STRL (SYRINGE) ×2 IMPLANT
SYR CONTROL 10ML LL (SYRINGE) ×2 IMPLANT
TOWEL GREEN STERILE (TOWEL DISPOSABLE) ×3 IMPLANT
TUBING ART PRESS 48 MALE/FEM (TUBING) IMPLANT
WATER STERILE IRR 1000ML POUR (IV SOLUTION) ×3 IMPLANT

## 2019-09-19 NOTE — Op Note (Addendum)
OPERATIVE NOTE  PROCEDURE:   1.  right carotid endarterectomy with bovine patch angioplasty 2.  right intraoperative carotid ultrasound  PRE-OPERATIVE DIAGNOSIS: right asymptomatic high grade carotid stenosis (>80%)  POST-OPERATIVE DIAGNOSIS: same as above   SURGEON: Cephus Shelling, MD  ASSISTANT(S): Wendi Maya, PA  ANESTHESIA: general  ESTIMATED BLOOD LOSS: <50 mL  FINDING(S): 1.  High grade calcified right carotid plaque 2.  No evidence of intimal flap visualized on transverse or longitudinal ultrasonograph after endarterectomy. 3.  Continuous Doppler audible flow signatures are appropriate for each carotid artery after endarterectomy.  SPECIMEN(S):  Carotid plaque (sent to Pathology)  INDICATIONS:   Jerry Ferguson is a 74 y.o. male who presents with right asymptomatic high grade carotid stenosis >80%.  I discussed with the patient the risks, benefits, and alternatives to carotid endarterectomy.  I discussed the procedural details of carotid endarterectomy with the patient.  The patient is aware that the risks of carotid endarterectomy include but are not limited to: bleeding, infection, stroke, myocardial infarction, death, cranial nerve injuries both temporary and permanent, neck hematoma, possible airway compromise, labile blood pressure post-operatively, cerebral hyperperfusion syndrome, and possible need for additional interventions in the future. The patient is aware of the risks and agrees to proceed forward with the procedure.  An assistant was needed for exposure and to expedite the case.  DESCRIPTION: After full informed written consent was obtained from the patient, the patient was brought back to the operating room and placed supine upon the operating table.  Prior to induction, the patient received IV antibiotics.  After obtaining adequate anesthesia, the patient was placed into semi-Fowler position with a shoulder roll in place and the patient's neck  slightly hyperextended and rotated away from the surgical site.  The patient was prepped in the standard fashion for a right carotid endarterectomy.  I made an incision anterior to the sternocleidomastoid muscle and dissected down through the subcutaneous tissue.  The platysma was opened with electrocautery.  I then used Bovie cautery and blunt dissection to dissect through the underlying platysma and to mobilize the anterior border of the sternocleidomastoid as well as the internal jugular vein laterally.  The facial vein was ligated with 3-0 silk and surgical clips and divided.  After identifying the carotid artery I used Metzenbaum scissors to bluntly dissect the common carotid artery and then controlled this with a umbilical tape.  At this point in time the patient was given 100 units/kg of IV heparin and we checked an ACT to ensure it was greater than 250.  I then carried my dissection cephalad and mobilized the external carotid artery and superior thyroid artery and controlled each of these with a vessel loop.  I then dissected out the internal carotid artery well past the distal plaque.  The internal carotid artery was then controlled with a Vesseloop as well. I was careful to identify the vagus nerve between the internal jugular and common carotid and this was presereved.  I was also careful to identify and preserve the hypoglossal nerve and this was preserved.    Once our ACT was confirmed, I proceeded by clamping the internal carotid artery with a angled bulldog clamp first.  The proximal common carotid artery was controlled with a angled debakey clamp.  The external carotid was controlled with a vessel loop.  I subsequently opened the common carotid artery with an 11 blade scalpel in longitudinal fashion and extended the arteriotomy with Potts scissors onto the ICA past the  distal plaque.   I then used a Runner, broadcasting/film/video and performed a endarterectomy starting in the common carotid artery.  The external  carotid artery was endarterectomized with an eversion technique and I was careful to feather the distal ICA plaque.  The specimen was passed off the field.  The endarterectomy site was then flushed with heparinized saline and I was careful to ensure there were no flaps in the endarterectomy site.  There was pulsatile backbleeding from the ICA and I elected not to place a shunt given asymptomatic lesion as well.  I tacked down the distal flap in the ICA with multiple 7-0 Prolenes.  I then brought a bovine carotid patch on the field and this was sewn in place with a running anastomosis using a 6-0 Prolene distally and a 5-0 proximal.  The bovine patch was trimmed accordingly.  The artery was flushed antegrade and retrograde prior to completion of the patch.  Once the patch was complete, I flushed up the external carotid artery first prior to releasing the internal carotid artery clamp.  An intraoperative duplex was performed that showed no evidence of any flaps.  A doppler also confirmed normal flow in the ICA and ECA.  Once I was happy with the intraoperative ultrasound the patient was given protamine for reversal.  I used surgicel Snow to get hemostasis around the patch.  Ultimately the platysma was closed in running fashion with 3-0 Vicryl.  The skin was closed with a running 4-0 Monocryl.  Dermabond was applied with a dry sterile dressing.  The patient was awakened from anesthesia with no new neurological deficit and taken to PACU in stable condition.    COMPLICATIONS: None  CONDITION: Stable  Cephus Shelling, MD [facility]: [phone]  09/19/2019, 9:53 AM

## 2019-09-19 NOTE — Anesthesia Postprocedure Evaluation (Signed)
Anesthesia Post Note  Patient: Jerry Ferguson  Procedure(s) Performed: RIGHT ENDARTERECTOMY CAROTID (Right ) PATCH ANGIOPLASTY RIGHT CAROTID (Right )     Patient location during evaluation: PACU Anesthesia Type: General Level of consciousness: awake and alert, patient cooperative and oriented Pain management: pain level controlled Vital Signs Assessment: post-procedure vital signs reviewed and stable Respiratory status: spontaneous breathing, nonlabored ventilation and respiratory function stable Cardiovascular status: stable (requiring support) Postop Assessment: no apparent nausea or vomiting Anesthetic complications: no   No complications documented.  Last Vitals:  Vitals:   09/19/19 1224 09/19/19 1239  BP: (!) 79/43 (!) 77/44  Pulse: (!) 47 (!) 46  Resp: 11 11  Temp:    SpO2: 97% 99%    Last Pain:  Vitals:   09/19/19 1224  PainSc: 2                  Hitomi Slape,E. Brina Umeda

## 2019-09-19 NOTE — Anesthesia Procedure Notes (Signed)
Procedure Name: Intubation Date/Time: 09/19/2019 7:45 AM Performed by: Barrington Ellison, CRNA Pre-anesthesia Checklist: Patient identified, Emergency Drugs available, Suction available and Patient being monitored Patient Re-evaluated:Patient Re-evaluated prior to induction Oxygen Delivery Method: Circle System Utilized Preoxygenation: Pre-oxygenation with 100% oxygen Induction Type: IV induction Ventilation: Mask ventilation without difficulty Laryngoscope Size: Mac and 4 Grade View: Grade I Tube type: Oral Tube size: 7.5 mm Number of attempts: 1 Airway Equipment and Method: Stylet and Oral airway Placement Confirmation: ETT inserted through vocal cords under direct vision,  positive ETCO2 and breath sounds checked- equal and bilateral Secured at: 22 cm Tube secured with: Tape Dental Injury: Teeth and Oropharynx as per pre-operative assessment

## 2019-09-19 NOTE — H&P (Signed)
History and Physical Interval Note:  09/19/2019 7:26 AM  Jerry Ferguson  has presented today for surgery, with the diagnosis of RIGHT CAROTID STENOSIS.  The various methods of treatment have been discussed with the patient and family. After consideration of risks, benefits and other options for treatment, the patient has consented to  Procedure(s): RIGHT ENDARTERECTOMY CAROTID (Right) as a surgical intervention.  The patient's history has been reviewed, patient examined, no change in status, stable for surgery.  I have reviewed the patient's chart and labs.  Questions were answered to the patient's satisfaction.    Right CEA.  High grade asymptomatic stenosis.  Risks and benefits discussed including perioperative stroke, bleeding, hematoma, CN injury, risk anesthesia, etc.  Jerry Shelling  Patient name: Jerry Ferguson          MRN: [id number]        DOB: 08-May-1945            Sex: male  REASON FOR CONSULT: Discuss right carotid endarterectomy  HPI: Jerry Ferguson is a 74 y.o. male, with multiple medical problems including hx severe aortic stenosis now s/p TAVR, hypertension, hyperlipidemia as well as known carotid disease that presents to discuss carotid surgery.  He was last seen by me on 05/04/18 and his right sided carotid disease had advanced to >80% stenosis.  His lesion was asymptomatic and he reported no history of TIA or stroke.   Offered to schedule his right carotid last year and ultimately he was going to call back to schedule but never did.  States he got busy with family and other needs.  Ultimately I got a message from cardiology to get him reestablished with our practice.  He was having some shortness of breath but has been worked up by cardiology and cleared for surgery with a normal echocardiogram on 08/11/19 with EF 65% and normal functioning TAVR.  Reports no neurologic events since I last evaluated him last year.  No head or neck surgery or radiation.  Takes an  aspirin and statin daily.      Past Medical History:  Diagnosis Date  . Abnormal liver function test   . Acute bronchitis   . Carotid artery occlusion    Bilateral Bruit  . Cataract   . Coronary artery disease involving native coronary artery of native heart without angina pectoris   . DJD (degenerative joint disease)   . Dyspnea   . GERD (gastroesophageal reflux disease)   . Heart murmur   . History of UTI   . Hypercholesteremia   . Hypertension   . Lumbar back pain   . Overweight(278.02)   . Pulmonary nodules    Multiple small pulmonary nodules scattered throughout both lungs, 12 month CT rec if pt high risk  . S/P TAVR (transcatheter aortic valve replacement)    26 mm Edwards Sapien 3 transcatheter heart valve placed via percutaneous right transfemoral approach   . Severe aortic stenosis   . Severe aortic stenosis          Past Surgical History:  Procedure Laterality Date  . APPENDECTOMY    . COLONOSCOPY    . CORONARY ANGIOGRAPHY N/A 02/04/2018   Procedure: CORONARY ANGIOGRAPHY;  Surgeon: Tonny Bollman, MD;  [facility]VAS[facility] CV LAB;  Service: Cardiovascular;  Laterality: N/A;  . RIGHT HEART CATH N/A 02/04/2018   Procedure: RIGHT HEART CATH;  Surgeon: Tonny Bollman, MD;  Location: [facility] INVASIVE CV LAB;  Service: Cardiovascular;  Laterality: N/A;  . TEE WITHOUT  CARDIOVERSION N/A 02/23/2018   Procedure: TRANSESOPHAGEAL ECHOCARDIOGRAM (TEE);  Surgeon: Tonny Bollman, MD;  Location: [facility] INVASIVE CV LAB;  Service: Open Heart Surgery;  Laterality: N/A;  . TRANSCATHETER AORTIC VALVE REPLACEMENT, TRANSFEMORAL  02/23/2018  . TRANSCATHETER AORTIC VALVE REPLACEMENT, TRANSFEMORAL N/A 02/23/2018   Procedure: TRANSCATHETER AORTIC VALVE REPLACEMENT, TRANSFEMORAL;  Surgeon: Tonny Bollman, MD;  Location: [facility] INVASIVE CV LAB;  Service: Open Heart Surgery;  Laterality: N/A;         Family History  Problem Relation Age of Onset  . Colon cancer  Father        metastatic  . Other Father        DJD  . Arthritis Father 24  . Healthy Mother        Living-87  . Prostate cancer Brother   . Heart attack Paternal Grandfather   . Heart disease Paternal Grandfather   . Cancer Paternal Uncle   . Colon cancer Sister   . Hypertension Daughter        x1  . Healthy Daughter        x1  . Esophageal cancer Neg Hx   . Stomach cancer Neg Hx   . Rectal cancer Neg Hx     SOCIAL HISTORY: Social History        Socioeconomic History  . Marital status: Widowed    Spouse name: stacey(deceased 04/27/2009)  . Number of children: 1  . Years of education: Not on file  . Highest education level: Not on file  Occupational History  . Occupation: welder  Tobacco Use  . Smoking status: Former Smoker    Packs/day: 1.00    Years: 15.00    Pack years: 15.00    Types: Cigarettes    Quit date: 01/06/1989    Years since quitting: 30.6  . Smokeless tobacco: Never Used  . Tobacco comment: chewed some in early 04-28-2022  Vaping Use  . Vaping Use: Never used  Substance and Sexual Activity  . Alcohol use: No  . Drug use: No  . Sexual activity: Not on file  Other Topics Concern  . Not on file  Social History Narrative   Uncle with prostate cancer?   Social Determinants of Health      Financial Resource Strain: Low Risk   . Difficulty of Paying Living Expenses: Not hard at all  Food Insecurity:   . Worried About Programme researcher, broadcasting/film/video in the Last Year:   . Barista in the Last Year:   Transportation Needs:   . Freight forwarder (Medical):   Marland Kitchen Lack of Transportation (Non-Medical):   Physical Activity:   . Days of Exercise per Week:   . Minutes of Exercise per Session:   Stress:   . Feeling of Stress :   Social Connections:   . Frequency of Communication with Friends and Family:   . Frequency of Social Gatherings with Friends and Family:   . Attends Religious Services:   . Active Member of Clubs or  Organizations:   . Attends Banker Meetings:   Marland Kitchen Marital Status:   Intimate Partner Violence:   . Fear of Current or Ex-Partner:   . Emotionally Abused:   Marland Kitchen Physically Abused:   . Sexually Abused:     No Known Allergies        Current Outpatient Medications  Medication Sig Dispense Refill  . aspirin EC 81 MG tablet Take 1 tablet (81 mg total) by mouth daily. Swallow whole. 90  tablet 3  . atorvastatin (LIPITOR) 20 MG tablet Take 1 tablet (20 mg total) by mouth daily. 90 tablet 3  . cyclobenzaprine (FLEXERIL) 5 MG tablet 1 tab po q hs as needed neck pain 10 tablet 0  . fluticasone (FLONASE) 50 MCG/ACT nasal spray SPRAY 2 SPRAYS INTO EACH NOSTRIL EVERY DAY 48 mL 1  . lisinopril (ZESTRIL) 5 MG tablet TAKE 1 TABLET BY MOUTH EVERY DAY 30 tablet 0  . albuterol (VENTOLIN HFA) 108 (90 Base) MCG/ACT inhaler Inhale 2 puffs into the lungs every 6 (six) hours as needed. (Patient not taking: Reported on 08/23/2019) 18 g 0  . amoxicillin (AMOXIL) 500 MG capsule Take 4 tablets (2000 mg) ONE HOUR prior to any dental procedure or cleaning (Patient not taking: Reported on 08/23/2019) 8 capsule 2   No current facility-administered medications for this visit.    REVIEW OF SYSTEMS:  [X]  denotes positive finding, [ ]  denotes negative finding Cardiac  Comments:  Chest pain or chest pressure:    Shortness of breath upon exertion:    Short of breath when lying flat:    Irregular heart rhythm:        Vascular    Pain in calf, thigh, or hip brought on by ambulation:    Pain in feet at night that wakes you up from your sleep:     Blood clot in your veins:    Leg swelling:         Pulmonary    Oxygen at home:    Productive cough:     Wheezing:         Neurologic    Sudden weakness in arms or legs:     Sudden numbness in arms or legs:     Sudden onset of difficulty speaking or slurred speech:    Temporary loss of vision in one eye:      Problems with dizziness:         Gastrointestinal    Blood in stool:     Vomited blood:         Genitourinary    Burning when urinating:     Blood in urine:        Psychiatric    Major depression:         Hematologic    Bleeding problems:    Problems with blood clotting too easily:        Skin    Rashes or ulcers:        Constitutional    Fever or chills:      PHYSICAL EXAM:     Vitals:   08/23/19 1548 08/23/19 1552  BP: (!) 147/69 (!) 142/61  Pulse: 72 72  Resp: 18   Temp: (!) 97.5 F (36.4 C)   TempSrc: Temporal   SpO2: 100%   Weight: 209 lb (94.8 kg)   Height: 5\' 11"  (1.803 m)     GENERAL: The patient is a well-nourished male, in no acute distress. The vital signs are documented above. CARDIAC: There is a regular rate and rhythm.  VASCULAR:  No neck incisions from prior surgery PULMONARY: There is good air exchange bilaterally without wheezing or rales. ABDOMEN: Soft and non-tender with normal pitched bowel sounds.  MUSCULOSKELETAL: There are no major deformities or cyanosis. NEUROLOGIC: No focal weakness or paresthesias are detected.  CN II-XII grossly intact.   DATA:   Last carotid duplex from 01/12/18 showed a high-grade greater than 80% stenosis of the right ICA with a velocity 401/159.  Left ICA with 40 to 59% stenosis.  Assessment/Plan:  74 year old male with high-grade asymptomatic right ICA stenosis now status post TAVR for severe aortic stenosis.  We offered to schedule the surgery last year when he was last evaluated and ultimately he states he got busy and never scheduled surgery.  I got a message from cardiology to get him back into our practice for reevaluation.  His lesion remains asymptomatic.  He has been cleared by cardiology.  Discussed plan to proceed with right carotid endarterectomy for stroke prevention.  I offered to schedule this today and he wants to wait and call back.   I again gave him our office contact information.  States he lives alone and needs to discuss with his family to find some support to stay with him after surgery. Discussed risk and benefits again including risk of stroke, nerve cranial nerve injury, bleeding, infection, 1% risk of perioperative stroke etc.  Needs new carotid duplex given his old duplex is 431.74 years old to ensure carotid still patent on the right.   Jerry Shellinghristopher J. Ruhama Lehew, MD [facility] [place] Office: [phone]

## 2019-09-19 NOTE — Progress Notes (Signed)
  Progress Note    09/19/2019 1:17 PM Day of Surgery  Subjective:  Complaining of stiff neck and slight right frontal headache. No vision disturbances. Requiring dopamine for hypotension. BP currently 106/78. Art-line waveform not optimal. Cuff pressure accurate. Just voided spontaneously.  Vitals:   09/19/19 1224 09/19/19 1239  BP: (!) 79/43 (!) 77/44  Pulse: (!) 47 (!) 46  Resp: 11 11  Temp:    SpO2: 97% 99%  Tele: sinus brady  Physical Exam: Neuro: A and O times 4.  Tongue midline. Face symmetric. Cardiac:  RRR (pulse 55) Lungs:  nonlabored Incisions:  Right neck: no edema or bleeding Extremities:  Moves all well. 5/5 grip strength   CBC    Component Value Date/Time   WBC 7.3 09/16/2019 1230   RBC 4.80 09/16/2019 1230   HGB 13.5 09/16/2019 1230   HGB 13.6 01/26/2019 1213   HCT 42.9 09/16/2019 1230   HCT 40.0 01/26/2019 1213   PLT 208 09/16/2019 1230   PLT 205 01/26/2019 1213   MCV 89.4 09/16/2019 1230   MCV 86 01/26/2019 1213   MCH 28.1 09/16/2019 1230   MCHC 31.5 09/16/2019 1230   RDW 13.1 09/16/2019 1230   RDW 14.5 01/26/2019 1213   LYMPHSABS 2.3 02/03/2018 1249   MONOABS 0.6 10/13/2017 0752   EOSABS 0.3 02/03/2018 1249   BASOSABS 0.1 02/03/2018 1249    BMET    Component Value Date/Time   NA 140 09/16/2019 1230   NA 143 01/26/2019 0733   K 3.9 09/16/2019 1230   CL 107 09/16/2019 1230   CO2 24 09/16/2019 1230   GLUCOSE 95 09/16/2019 1230   BUN 13 09/16/2019 1230   BUN 18 01/26/2019 0733   CREATININE 1.07 09/16/2019 1230   CREATININE 0.97 11/04/2013 1038   CALCIUM 9.0 09/16/2019 1230   GFRNONAA >60 09/16/2019 1230   GFRNONAA 80 11/04/2013 1038   GFRAA >60 09/16/2019 1230   GFRAA >89 11/04/2013 1038     Intake/Output Summary (Last 24 hours) at 09/19/2019 1317 Last data filed at 09/19/2019 0930 Gross per 24 hour  Intake 1000 ml  Output 75 ml  Net 925 ml    HOSPITAL MEDICATIONS Scheduled Meds: . Chlorhexidine Gluconate Cloth  6 each  Topical Once   And  . Chlorhexidine Gluconate Cloth  6 each Topical Once  . fentaNYL       Continuous Infusions: . sodium chloride    . albumin human    . DOPamine    . DOPamine 3 mcg/kg/min (09/19/19 1239)  . lactated ringers     PRN Meds:.fentaNYL (SUBLIMAZE) injection, hydrALAZINE, labetalol, meperidine (DEMEROL) injection, metoprolol tartrate, midazolam, promethazine  Assessment: s/p right CEA>high grade stenosis. Hypotension>on dopmamine. Neuro intact.   Plan: -Try to titrate dopamine.  Will leave on fixed dose if necessary. Monitor h/a -DVT prophylaxis:  SCDs   Wendi Maya, PA-C [facility] [phone] 09/19/2019  1:17 PM

## 2019-09-19 NOTE — Transfer of Care (Signed)
Immediate Anesthesia Transfer of Care Note  Patient: Jerry Ferguson  Procedure(s) Performed: RIGHT ENDARTERECTOMY CAROTID (Right ) PATCH ANGIOPLASTY RIGHT CAROTID (Right )  Patient Location: PACU  Anesthesia Type:General  Level of Consciousness: awake, alert  and oriented  Airway & Oxygen Therapy: Patient Spontanous Breathing  Post-op Assessment: Report given to RN, Patient moving all extremities X 4 and Patient able to stick tongue midline  Post vital signs: Reviewed and stable  Last Vitals:  Vitals Value Taken Time  BP 86/50 09/19/19 1023  Temp    Pulse 63 09/19/19 1026  Resp 13 09/19/19 1026  SpO2 96 % 09/19/19 1026  Vitals shown include unvalidated device data.  Last Pain:  Vitals:   09/19/19 0632  PainSc: 0-No pain      Patients Stated Pain Goal: 4 (09/19/19 9872)  Complications: No complications documented.

## 2019-09-20 ENCOUNTER — Encounter (HOSPITAL_COMMUNITY): Payer: Self-pay | Admitting: Vascular Surgery

## 2019-09-20 LAB — BASIC METABOLIC PANEL
Anion gap: 8 (ref 5–15)
BUN: 14 mg/dL (ref 8–23)
CO2: 23 mmol/L (ref 22–32)
Calcium: 8.3 mg/dL — ABNORMAL LOW (ref 8.9–10.3)
Chloride: 112 mmol/L — ABNORMAL HIGH (ref 98–111)
Creatinine, Ser: 1.1 mg/dL (ref 0.61–1.24)
GFR calc Af Amer: >60 mL/min (ref >60)
GFR calc non Af Amer: >60 mL/min (ref >60)
Glucose, Bld: 115 mg/dL — ABNORMAL HIGH (ref 70–99)
Potassium: 4 mmol/L (ref 3.5–5.1)
Sodium: 143 mmol/L (ref 135–145)

## 2019-09-20 LAB — CBC
HCT: 33.5 % — ABNORMAL LOW (ref 39.0–52.0)
Hemoglobin: 11.4 g/dL — ABNORMAL LOW (ref 13.0–17.0)
MCH: 30 pg (ref 26.0–34.0)
MCHC: 34 g/dL (ref 30.0–36.0)
MCV: 88.2 fL (ref 80.0–100.0)
Platelets: 156 10*3/uL (ref 150–400)
RBC: 3.8 MIL/uL — ABNORMAL LOW (ref 4.22–5.81)
RDW: 13.4 % (ref 11.5–15.5)
WBC: 12.9 10*3/uL — ABNORMAL HIGH (ref 4.0–10.5)
nRBC: 0 % (ref 0.0–0.2)

## 2019-09-20 MED ORDER — DOPAMINE-DEXTROSE 3.2-5 MG/ML-% IV SOLN
5.0000 ug/kg/min | INTRAVENOUS | Status: DC
  Filled 2019-09-20: qty 250

## 2019-09-20 MED ORDER — HYDROCODONE-ACETAMINOPHEN 5-325 MG PO TABS
1.0000 | ORAL_TABLET | Freq: Four times a day (QID) | ORAL | 0 refills | Status: DC | PRN
Start: 2019-09-20 — End: 2021-01-17

## 2019-09-20 MED ORDER — SODIUM CHLORIDE 0.9 % IV BOLUS
500.0000 mL | Freq: Once | INTRAVENOUS | Status: AC
  Administered 2019-09-20: 500 mL via INTRAVENOUS

## 2019-09-20 NOTE — Discharge Instructions (Signed)
[facility]  Discharge Instructions   Carotid Endarterectomy (CEA)  Please refer to the following instructions for your post-procedure care. Your surgeon or physician assistant will discuss any changes with you.  Activity  You are encouraged to walk as much as you can. You can slowly return to normal activities but must avoid strenuous activity and heavy lifting until your doctor tell you it's OK. Avoid activities such as vacuuming or swinging a golf club. You can drive after one week if you are comfortable and you are no longer taking prescription pain medications. It is normal to feel tired for serval weeks after your surgery. It is also normal to have difficulty with sleep habits, eating, and bowel movements after surgery. These will go away with time.  Bathing/Showering  You may shower after you come home. Do not soak in a bathtub, hot tub, or swim until the incision heals completely.  Incision Care  Shower every day. Clean your incision with mild soap and water. Pat the area dry with a clean towel. You do not need a bandage unless otherwise instructed. Do not apply any ointments or creams to your incision. You may have skin glue on your incision. Do not peel it off. It will come off on its own in about one week. Your incision may feel thickened and raised for several weeks after your surgery. This is normal and the skin will soften over time. For Men Only: It's OK to shave around the incision but do not shave the incision itself for 2 weeks. It is common to have numbness under your chin that could last for several months.  Diet  Resume your normal diet. There are no special food restrictions following this procedure. A low fat/low cholesterol diet is recommended for all patients with vascular disease. In order to heal from your surgery, it is CRITICAL to get adequate nutrition. Your body requires vitamins, minerals, and protein. Vegetables are the best  source of vitamins and minerals. Vegetables also provide the perfect balance of protein. Processed food has little nutritional value, so try to avoid this.   Medications  Resume taking all of your medications unless your doctor or physician assistant tells you not to. If your incision is causing pain, you may take over-the- counter pain relievers such as acetaminophen (Tylenol). If you were prescribed a stronger pain medication, please be aware these medications can cause nausea and constipation. Prevent nausea by taking the medication with a snack or meal. Avoid constipation by drinking plenty of fluids and eating foods with a high amount of fiber, such as fruits, vegetables, and grains. Do not take Tylenol if you are taking prescription pain medications.  Do not take Lisinopril for 2-3 days until blood pressure's top number of the blood pressure is above 120.   Follow Up  Our office will schedule a follow up appointment 2-3 weeks following discharge.  Please call us immediately for any of the following conditions  Increased pain, redness, drainage (pus) from your incision site. Fever of 101 degrees or higher. If you should develop stroke (slurred speech, difficulty swallowing, weakness on one side of your body, loss of vision) you should call 911 and go to the nearest emergency room.  Reduce your risk of vascular disease:  Stop smoking. If you would like help call [facility] at 1-800-QUIT-NOW (310-869-7154) or [place] at [phone]. Manage your cholesterol Maintain a desired weight Control your diabetes Keep your blood pressure down  If you have  any questions, please call the office at [phone].

## 2019-09-20 NOTE — Progress Notes (Addendum)
  Progress Note    09/20/2019 7:12 AM 1 Day Post-Op  Subjective:  No complaints; denies trouble swallowing.  Has not walked.    Afebrile HR 40's-80's SB/NSR 70's-130's systolic 95% 2LO2NC  Gtts: Dopamine  Vitals:   09/20/19 0600 09/20/19 0630  BP: (!) 109/52 (!) 119/54  Pulse:    Resp: 13 13  Temp:    SpO2: 95% 95%     Physical Exam: Neuro:  In tact; tongue is midline Lungs:  Non labored Incision:  Clean and dry without hematoma  CBC    Component Value Date/Time   WBC 12.9 (H) 09/20/2019 0340   RBC 3.80 (L) 09/20/2019 0340   HGB 11.4 (L) 09/20/2019 0340   HGB 13.6 01/26/2019 1213   HCT 33.5 (L) 09/20/2019 0340   HCT 40.0 01/26/2019 1213   PLT 156 09/20/2019 0340   PLT 205 01/26/2019 1213   MCV 88.2 09/20/2019 0340   MCV 86 01/26/2019 1213   MCH 30.0 09/20/2019 0340   MCHC 34.0 09/20/2019 0340   RDW 13.4 09/20/2019 0340   RDW 14.5 01/26/2019 1213   LYMPHSABS 2.3 02/03/2018 1249   MONOABS 0.6 10/13/2017 0752   EOSABS 0.3 02/03/2018 1249   BASOSABS 0.1 02/03/2018 1249    BMET    Component Value Date/Time   NA 143 09/20/2019 0340   NA 143 01/26/2019 0733   K 4.0 09/20/2019 0340   CL 112 (H) 09/20/2019 0340   CO2 23 09/20/2019 0340   GLUCOSE 115 (H) 09/20/2019 0340   BUN 14 09/20/2019 0340   BUN 18 01/26/2019 0733   CREATININE 1.10 09/20/2019 0340   CREATININE 0.97 11/04/2013 1038   CALCIUM 8.3 (L) 09/20/2019 0340   GFRNONAA >60 09/20/2019 0340   GFRNONAA 80 11/04/2013 1038   GFRAA >60 09/20/2019 0340   GFRAA >89 11/04/2013 1038     Intake/Output Summary (Last 24 hours) at 09/20/2019 0712 Last data filed at 09/20/2019 0600 Gross per 24 hour  Intake 3052.24 ml  Output 1875 ml  Net 1177.24 ml     Assessment/Plan:  This is a 74 y.o. male who is s/p right CEA 1 Day Post-Op  -pt is doing well this am.  He did have soft blood pressures yesterday and was given fluid bolus and restarted on Dopamine.  His systolic this am is 127.  Will turn  off Dopamine and see if he tolerates.   -pt neuro exam is in tact; no difficulty swallowing. -pt has not ambulated-will get up and walk this am  -leukocytosis-most likely related to peri op period.  Needs to work on IS and ambulate and OOB.   -pt has voided -hopeful we can get pt off Dopamine, ambulate and possibly discharge home later today. -f/u with Dr. Chestine Spore in 2-3 weeks. -PDMP reviewed   Doreatha Massed, PA-C [facility] [phone]  I have seen and evaluated the patient. I agree with the PA note as documented above.  Postop day 1 status post right carotid endarterectomy for asymptomatic high-grade stenosis.  Neck looks good and neuro intact.  Weaned off dopamine this morning.  Plan for discharge today follow-up in 3 to 4 weeks for incision check.  He will continue his aspirin at discharge.  Cephus Shelling, MD [facility] [place] Office: [phone]

## 2019-09-20 NOTE — Discharge Summary (Signed)
Discharge Summary     Jerry Ferguson Jul 09, 1945 74 y.o. male  [id number]  Admission Date: 09/19/2019  Discharge Date: 09/25/2019  Physician: No att. providers found  Admission Diagnosis: Carotid stenosis, right [I65.21]   HPI:   This is a 74 y.o. male with multiple medical problems including hx severe aortic stenosis now s/p TAVR, hypertension, hyperlipidemia as well as known carotid disease that presents to discuss carotid surgery. He was last seen by me on 05/04/18 and his right sided carotid disease had advanced to >80% stenosis. His lesion was asymptomatic and he reported no history of TIA or stroke.   Offered to schedulehis right carotidlast year and ultimately he was going to call back to schedule but never did. States he got busy with family and other needs. Ultimately I got a message from cardiology to get him reestablished with our practice. He was having some shortness of breath but has been worked up by cardiology and cleared for surgery with a normal echocardiogramon 08/11/19 with EF 65% and normal functioning TAVR.  Reportsno neurologic events since I last evaluated him last year. No head or neck surgery or radiation. Takes an aspirin and statin daily.  Hospital Course:  The patient was admitted to the hospital and taken to the operating room on 09/19/2019 and underwent right carotid endarterectomy.    Findings: 1.  High grade calcified right carotid plaque 2.  No evidence of intimal flap visualized on transverse or longitudinal ultrasonograph after endarterectomy. 3.  Continuous Doppler audible flow signatures are appropriate for each carotid artery after endarterectomy.  The pt tolerated the procedure well and was transported to the PACU in good condition.   By POD 1, the pt neuro status was in tact.  He did receive a fluid bolus for hypotension and ultimately required Dopamine gtt.  This was weaned successfully.    The remainder of the hospital course  consisted of increasing mobilization and increasing intake of solids without difficulty.   . CBC    Component Value Date/Time   WBC 12.9 (H) 09/20/2019 0340   RBC 3.80 (L) 09/20/2019 0340   HGB 11.4 (L) 09/20/2019 0340   HGB 13.6 01/26/2019 1213   HCT 33.5 (L) 09/20/2019 0340   HCT 40.0 01/26/2019 1213   PLT 156 09/20/2019 0340   PLT 205 01/26/2019 1213   MCV 88.2 09/20/2019 0340   MCV 86 01/26/2019 1213   MCH 30.0 09/20/2019 0340   MCHC 34.0 09/20/2019 0340   RDW 13.4 09/20/2019 0340   RDW 14.5 01/26/2019 1213   LYMPHSABS 2.3 02/03/2018 1249   MONOABS 0.6 10/13/2017 0752   EOSABS 0.3 02/03/2018 1249   BASOSABS 0.1 02/03/2018 1249   BMET    Component Value Date/Time   NA 143 09/20/2019 0340   NA 143 01/26/2019 0733   K 4.0 09/20/2019 0340   CL 112 (H) 09/20/2019 0340   CO2 23 09/20/2019 0340   GLUCOSE 115 (H) 09/20/2019 0340   BUN 14 09/20/2019 0340   BUN 18 01/26/2019 0733   CREATININE 1.10 09/20/2019 0340   CREATININE 0.97 11/04/2013 1038   CALCIUM 8.3 (L) 09/20/2019 0340   GFRNONAA >60 09/20/2019 0340   GFRNONAA 80 11/04/2013 1038   GFRAA >60 09/20/2019 0340   GFRAA >89 11/04/2013 1038     Discharge Instructions    Discharge patient   Complete by: As directed    Discharge disposition: 01-Home or Self Care   Discharge patient date: 09/20/2019      Discharge Diagnosis:  Carotid stenosis, right [I65.21]  Secondary Diagnosis: Patient Active Problem List   Diagnosis Date Noted  . Carotid stenosis, right 09/19/2019  . Pulmonary nodules   . S/P TAVR (transcatheter aortic valve replacement)   . Coronary artery disease involving native coronary artery of native heart without angina pectoris   . Prostate cancer screening 11/04/2013  . Screening for ischemic heart disease 11/04/2013  . Carotid artery disease (HCC) 07/15/2013  . Severe aortic stenosis 11/28/2012  . Essential hypertension 11/28/2012  . Weakness generalized 05/14/2010  . OVERWEIGHT  02/27/2008  . HLD (hyperlipidemia) 11/26/2007  . GERD 11/26/2007  . DEGENERATIVE JOINT DISEASE 11/26/2007  . LIVER FUNCTION TESTS, ABNORMAL, HX OF 11/26/2007   Past Medical History:  Diagnosis Date  . Abnormal liver function test   . Acute bronchitis   . Carotid artery occlusion    Bilateral Bruit  . Cataract   . Coronary artery disease involving native coronary artery of native heart without angina pectoris   . DJD (degenerative joint disease)   . Dyspnea   . GERD (gastroesophageal reflux disease)   . Heart murmur   . History of UTI   . Hypercholesteremia   . Hypertension   . Lumbar back pain   . Overweight(278.02)   . Pulmonary nodules    Multiple small pulmonary nodules scattered throughout both lungs, 12 month CT rec if pt high risk  . S/P TAVR (transcatheter aortic valve replacement)    26 mm Edwards Sapien 3 transcatheter heart valve placed via percutaneous right transfemoral approach   . Severe aortic stenosis   . Severe aortic stenosis     Allergies as of 09/20/2019   No Known Allergies     Medication List    TAKE these medications   albuterol 108 (90 Base) MCG/ACT inhaler Commonly known as: VENTOLIN HFA Inhale 2 puffs into the lungs every 6 (six) hours as needed.   amoxicillin 500 MG capsule Commonly known as: AMOXIL Take 4 tablets (2000 mg) ONE HOUR prior to any dental procedure or cleaning What changed:   how much to take  how to take this  when to take this  additional instructions   aspirin EC 81 MG tablet Take 1 tablet (81 mg total) by mouth daily. Swallow whole.   atorvastatin 20 MG tablet Commonly known as: LIPITOR Take 1 tablet (20 mg total) by mouth daily.   cyclobenzaprine 5 MG tablet Commonly known as: FLEXERIL 1 tab po q hs as needed neck pain What changed:   how much to take  how to take this  when to take this  reasons to take this  additional instructions   fluticasone 50 MCG/ACT nasal spray Commonly known as:  FLONASE SPRAY 2 SPRAYS INTO EACH NOSTRIL EVERY DAY What changed: See the new instructions.   HYDROcodone-acetaminophen 5-325 MG tablet Commonly known as: NORCO/VICODIN Take 1 tablet by mouth every 6 (six) hours as needed for moderate pain.   lisinopril 5 MG tablet Commonly known as: ZESTRIL TAKE 1 TABLET BY MOUTH EVERY DAY        [facility] Discharge Instructions Carotid Endarterectomy (CEA)  Please refer to the following instructions for your post-procedure care. Your surgeon or physician assistant will discuss any changes with you.  Activity  You are encouraged to walk as much as you can. You can slowly return to normal activities but must avoid strenuous activity and heavy lifting until your doctor tell you it's OK. Avoid activities such as vacuuming or  swinging a golf club. You can drive after one week if you are comfortable and you are no longer taking prescription pain medications. It is normal to feel tired for serval weeks after your surgery. It is also normal to have difficulty with sleep habits, eating, and bowel movements after surgery. These will go away with time.  Bathing/Showering  You may shower after you come home. Do not soak in a bathtub, hot tub, or swim until the incision heals completely.  Incision Care  Shower every day. Clean your incision with mild soap and water. Pat the area dry with a clean towel. You do not need a bandage unless otherwise instructed. Do not apply any ointments or creams to your incision. You may have skin glue on your incision. Do not peel it off. It will come off on its own in about one week. Your incision may feel thickened and raised for several weeks after your surgery. This is normal and the skin will soften over time. For Men Only: It's OK to shave around the incision but do not shave the incision itself for 2 weeks. It is common to have numbness under your chin that could last for several  months.  Diet  Resume your normal diet. There are no special food restrictions following this procedure. A low fat/low cholesterol diet is recommended for all patients with vascular disease. In order to heal from your surgery, it is CRITICAL to get adequate nutrition. Your body requires vitamins, minerals, and protein. Vegetables are the best source of vitamins and minerals. Vegetables also provide the perfect balance of protein. Processed food has little nutritional value, so try to avoid this.  Medications  Resume taking all of your medications unless your doctor or physician assistant tells you not to.  If your incision is causing pain, you may take over-the- counter pain relievers such as acetaminophen (Tylenol). If you were prescribed a stronger pain medication, please be aware these medications can cause nausea and constipation.  Prevent nausea by taking the medication with a snack or meal. Avoid constipation by drinking plenty of fluids and eating foods with a high amount of fiber, such as fruits, vegetables, and grains.  Do not take Tylenol if you are taking prescription pain medications.  Follow Up  Our office will schedule a follow up appointment 2-3 weeks following discharge.  Please call us immediately for any of the following conditions  . Increased pain, redness, drainage (pus) from your incision site. . Fever of 101 degrees or higher. . If you should develop stroke (slurred speech, difficulty swallowing, weakness on one side of your body, loss of vision) you should call 911 and go to the nearest emergency room. .  Reduce your risk of vascular disease:  . Stop smoking. If you would like help call [facility] at 1-800-QUIT-NOW (417 720 33371-214-500-2106) or [place] at [phone]. . Manage your cholesterol . Maintain a desired weight . Control your diabetes . Keep your blood pressure down .  If you have any questions, please call the office at [phone].  Prescriptions  given: 1.   Vicodin#8 No Refill  Disposition: home  Patient's condition: is Good  Follow up: 1. Dr. Chestine Sporelark in 2-3 weeks.   Doreatha MassedSamantha Corleone Biegler, PA-C [facility] [phone]   --- For [facility] Registry use ---   Modified Rankin score at D/C (0-6): 0  IV medication needed for:  1. Hypertension: No 2. Hypotension: Yes  Post-op Complications: No  1. Post-op CVA or TIA: No  If  yes: Event classification (right eye, left eye, right cortical, left cortical, verterobasilar, other): n/a  If yes: Timing of event (intra-op, <6 hrs post-op, >=6 hrs post-op, unknown): n/a  2. CN injury: No  If yes: CN n/a injuried   3. Myocardial infarction: No  If yes: Dx by (EKG or clinical, Troponin): n/a  4.  CHF: No  5.  Dysrhythmia (new): No  6. Wound infection: No  7. Reperfusion symptoms: No  8. Return to OR: No  If yes: return to OR for (bleeding, neurologic, other CEA incision, other): n/a  Discharge medications: Statin use:  Yes ASA use:  Yes   Beta blocker use:  No ACE-Inhibitor use:  Yes  ARB use:  No CCB use: No P2Y12 Antagonist use: No, [ ]  Plavix, [ ]  Plasugrel, [ ]  Ticlopinine, [ ]  Ticagrelor, [ ]  Other, [ ]  No for medical reason, [ ]  Non-compliant, [ ]  Not-indicated Anti-coagulant use:  No, [ ]  Warfarin, [ ]  Rivaroxaban, [ ]  Dabigatran,  f

## 2019-09-20 NOTE — Progress Notes (Signed)
BP 78/46 -84/43 mmHg, HR 47-80. Sinus Bradycardia to NSR.  Pt alert and oriented x 4. Neuro checked q 2 hrs per protocol, no neuro deficits noted. Pt's asymptomatic.    Notified Dr. Randie Heinz, who's on-call tonight. Order received. 500 ml 0.9% NSS bolus given. We had to restart Dopamine initiated at 3 mcg/kg/min due to SBP < 100, MAP < 65 after a bolus given.   Continue to monitor.  Filiberto Pinks, RN

## 2019-09-26 ENCOUNTER — Telehealth: Payer: Self-pay | Admitting: Pharmacist

## 2019-09-26 NOTE — Progress Notes (Signed)
Chronic Care Management Pharmacy Assistant   Name: Jerry Ferguson  MRN: [id number] DOB: [birth year]  Reason for Encounter: Disease State  Patient Questions:  1.  Have you seen any other providers since your last visit?  Yes  2.  Any changes in your medicines or health? No     Chronic Care Management   Outreach Note  09/29/2019 Name: Jerry Ferguson MRN: [id number] DOB: [birth year]  Referred by: Esperanza Richters, PA-C Reason for referral : Chronic Care Management    PCP : Esperanza Richters, PA-C  Their chronic conditions include: Hypertension, Hyperlipidemia/CAD, Allergic Rhinitis  Consults: 08-23-2019 (Vascular surgery)  Patient is presented in office wth Jerry Ferguson to discuss right carotid endarterectomy.   Provider stated he offered to schedule the surgery last year when patient was last evaluated and ultimately Patient states he got busy and never scheduled surgery.  Provider got a message from cardiology to get him back into our practice for reevaluation.  Provider states patient lesion remains asymptomatic.  Patient has been cleared by cardiology.  Discussed plan to proceed with right carotid endarterectomy for stroke prevention.  I offered to schedule this today and he wants to wait and call back.  Again provider gave patient our office contact information.  Patient stated he lives alone and needs to discuss with his family to find some support to stay with him after surgery. Discussed risk and benefits again including risk of stroke, nerve cranial nerve injury, bleeding, infection, 1% risk of perioperative stroke etc.  Needs new carotid duplex given his old duplex is 52.74 years old to ensure carotid still patent on the right. 07-27-19 (Cardiology) Patient presented in office with Jerry Ferguson for a follow up PVL- 6 month follow up.  Provider stated patient had ongoing dyspnea that is unchanged. Patient stated he thinks it started when he was put on plavix ( which was  after his TAVR). Mostly notes this with exertion. No CP. No LE edema, orthopnea or PND. No dizziness or syncope. No blood in stool or urine. No palpitations. Has not been vaccinated for covid but is thinking about it  ( Provider reports that he has urged him to get shots in near future). Your physician has recommended you make the following change in your medication:  1.) stop clopidogrel (Plavix) 2.) start aspirin 81 mg daily 3.) amoxicillin 500 mg --take 4 TABLETS (2000 MG) by mouth ONE HOUR PRIOR TO ANY DENTAL PROCEDURES OR CLEANINGS Your physician has requested that you have an echocardiogram and a lexiscan myoview   Hospitalizations: 09-19-2019 [facility])  Patient is presented for surgery, with the diagnosis of RIGHT CAROTID STENOSIS.  The various methods of treatment have been discussed with the patient and family. After consideration of risks, benefits and other options for treatment, the patient has consented to  Procedure(s): RIGHT ENDARTERECTOMY CAROTID (Right) as a surgical intervention.  The patient's history has been reviewed, patient examined, no change in status, stable for surgery.  I have reviewed the patient's chart and labs.  Questions were answered to the patient's satisfaction.  Provider ordered Hydrocodone-acetaminophen 5-325 mg.  Allergies:  No Known Allergies  Medications: Outpatient Encounter Medications as of 09/26/2019  Medication Sig   albuterol (VENTOLIN HFA) 108 (90 Base) MCG/ACT inhaler Inhale 2 puffs into the lungs every 6 (six) hours as needed. (Patient not taking: Reported on 08/23/2019)   amoxicillin (AMOXIL) 500 MG capsule Take 4 tablets (2000 mg) ONE HOUR prior to any dental procedure or cleaning (  Patient taking differently: Take 2,000 mg by mouth See admin instructions. Take 2000 mg by mouth one hour prior to any dental procedure or cleaning)   aspirin EC 81 MG tablet Take 1 tablet (81 mg total) by mouth daily. Swallow whole.   atorvastatin  (LIPITOR) 20 MG tablet Take 1 tablet (20 mg total) by mouth daily.   cyclobenzaprine (FLEXERIL) 5 MG tablet 1 tab po q hs as needed neck pain (Patient taking differently: Take 5 mg by mouth at bedtime as needed (neck pain). )   fluticasone (FLONASE) 50 MCG/ACT nasal spray SPRAY 2 SPRAYS INTO EACH NOSTRIL EVERY DAY (Patient taking differently: Place 2 sprays into both nostrils daily as needed for allergies. )   HYDROcodone-acetaminophen (NORCO/VICODIN) 5-325 MG tablet Take 1 tablet by mouth every 6 (six) hours as needed for moderate pain.   lisinopril (ZESTRIL) 5 MG tablet TAKE 1 TABLET BY MOUTH EVERY DAY   No facility-administered encounter medications on file as of 09/26/2019.    Current Diagnosis: Patient Active Problem List   Diagnosis Date Noted   Carotid stenosis, right 09/19/2019   Pulmonary nodules    S/P TAVR (transcatheter aortic valve replacement)    Coronary artery disease involving native coronary artery of native heart without angina pectoris    Prostate cancer screening 11/04/2013   Screening for ischemic heart disease 11/04/2013   Carotid artery disease (HCC) 07/15/2013   Severe aortic stenosis 11/28/2012   Essential hypertension 11/28/2012   Weakness generalized 05/14/2010   OVERWEIGHT 02/27/2008   HLD (hyperlipidemia) 11/26/2007   GERD 11/26/2007   DEGENERATIVE JOINT DISEASE 11/26/2007   LIVER FUNCTION TESTS, ABNORMAL, HX OF 11/26/2007    Goals Addressed   None    Reviewed chart prior to disease state call. Spoke with patient regarding BP  Recent Office Vitals: BP Readings from Last 3 Encounters:  09/20/19 (!) 109/51  09/16/19 108/68  08/23/19 (!) 142/61   Pulse Readings from Last 3 Encounters:  09/20/19 65  09/16/19 65  08/23/19 72    Wt Readings from Last 3 Encounters:  09/19/19 205 lb 14.6 oz (93.4 kg)  09/16/19 206 lb (93.4 kg)  08/23/19 209 lb (94.8 kg)     Kidney Function Lab Results  Component Value Date/Time    CREATININE 1.10 09/20/2019 03:40 AM   CREATININE 1.07 09/16/2019 12:30 PM   CREATININE 0.97 11/04/2013 10:38 AM   CREATININE 1.02 05/04/2013 03:53 PM   GFR 66.27 12/15/2018 12:08 PM   GFRNONAA >60 09/20/2019 03:40 AM   GFRNONAA 80 11/04/2013 10:38 AM   GFRAA >60 09/20/2019 03:40 AM   GFRAA >89 11/04/2013 10:38 AM    BMP Latest Ref Rng & Units 09/20/2019 09/16/2019 01/26/2019  Glucose 70 - 99 mg/dL 250(N) 95 397(Q)  BUN 8 - 23 mg/dL 14 13 18   Creatinine 0.61 - 1.24 mg/dL 7.34 1.93  BUN/Creat Ratio 10 - 24 - - 17  Sodium 135 - 145 mmol/L 143 140 143  Potassium 3.5 - 5.1 mmol/L 4.0 3.9 4.3  Chloride 98 - 111 mmol/L 112(H) 107 105  CO2 22 - 32 mmol/L 23 24 23   Calcium 8.9 - 10.3 mg/dL 8.3(L) 9.0 9.1    Chronic Care Management   Outreach Note  09/29/2019 Name: Jerry Ferguson MRN: 10/01/2019 DOB: 02-Apr-1945  Referred by: [id number], PA-C Reason for referral : Chronic Care Management   Third unsuccessful telephone outreach was attempted today. The patient was referred to the pharmacist for assistance with care management and care coordination.  Follow-Up:  Pharmacist Review   [place] Shannon, RMA [facility] Clinical Pharmacist Assistant [phone]

## 2019-10-11 ENCOUNTER — Other Ambulatory Visit: Payer: Self-pay

## 2019-10-11 ENCOUNTER — Ambulatory Visit (INDEPENDENT_AMBULATORY_CARE_PROVIDER_SITE_OTHER): Payer: Self-pay | Admitting: Vascular Surgery

## 2019-10-11 ENCOUNTER — Encounter: Payer: Self-pay | Admitting: Vascular Surgery

## 2019-10-11 VITALS — BP 115/72 | HR 77 | Temp 97.6°F | Resp 18 | Ht 71.0 in | Wt 197.0 lb

## 2019-10-11 DIAGNOSIS — I6523 Occlusion and stenosis of bilateral carotid arteries: Secondary | ICD-10-CM

## 2019-10-11 NOTE — Progress Notes (Signed)
Patient name: Jerry Ferguson MRN: [id number] DOB: July 10, 1945 Sex: male  REASON FOR VISIT: Postop check after right carotid endarterectomy  HPI: Jerry Ferguson is a 74 y.o. male presents for postop check after right carotid endarterectomy on 09/19/2019 for an asymptomatic high-grade stenosis.  Ultimately his postop course was uncomplicated.  Today he has no specific concerns.  No neurologic events since discharge.  Incision is healing without issue.  Remains on aspirin and statin.  Past Medical History:  Diagnosis Date  . Abnormal liver function test   . Acute bronchitis   . Carotid artery occlusion    Bilateral Bruit  . Cataract   . Coronary artery disease involving native coronary artery of native heart without angina pectoris   . DJD (degenerative joint disease)   . Dyspnea   . GERD (gastroesophageal reflux disease)   . Heart murmur   . History of UTI   . Hypercholesteremia   . Hypertension   . Lumbar back pain   . Overweight(278.02)   . Pulmonary nodules    Multiple small pulmonary nodules scattered throughout both lungs, 12 month CT rec if pt high risk  . S/P TAVR (transcatheter aortic valve replacement)    26 mm Edwards Sapien 3 transcatheter heart valve placed via percutaneous right transfemoral approach   . Severe aortic stenosis   . Severe aortic stenosis     Past Surgical History:  Procedure Laterality Date  . APPENDECTOMY    . CAROTID ENDARTERECTOMY Right 09/19/2019  . COLONOSCOPY    . CORONARY ANGIOGRAPHY N/A 02/04/2018   Procedure: CORONARY ANGIOGRAPHY;  Surgeon: Tonny Bollman, MD;  Location: [facility] INVASIVE CV LAB;  Service: Cardiovascular;  Laterality: N/A;  . ENDARTERECTOMY Right 09/19/2019   Procedure: RIGHT ENDARTERECTOMY CAROTID;  Surgeon: Cephus Shelling, MD;  Location: [facility] OR;  Service: Vascular;  Laterality: Right;  . PATCH ANGIOPLASTY Right 09/19/2019   Procedure: PATCH ANGIOPLASTY RIGHT CAROTID;  Surgeon: Cephus Shelling, MD;  Location: [facility] OR;   Service: Vascular;  Laterality: Right;  . RIGHT HEART CATH N/A 02/04/2018   Procedure: RIGHT HEART CATH;  Surgeon: Tonny Bollman, MD;  Location: [facility] INVASIVE CV LAB;  Service: Cardiovascular;  Laterality: N/A;  . TEE WITHOUT CARDIOVERSION N/A 02/23/2018   Procedure: TRANSESOPHAGEAL ECHOCARDIOGRAM (TEE);  Surgeon: Tonny Bollman, MD;  Location: [facility] INVASIVE CV LAB;  Service: Open Heart Surgery;  Laterality: N/A;  . TRANSCATHETER AORTIC VALVE REPLACEMENT, TRANSFEMORAL  02/23/2018  . TRANSCATHETER AORTIC VALVE REPLACEMENT, TRANSFEMORAL N/A 02/23/2018   Procedure: TRANSCATHETER AORTIC VALVE REPLACEMENT, TRANSFEMORAL;  Surgeon: Tonny Bollman, MD;  Location: [facility] INVASIVE CV LAB;  Service: Open Heart Surgery;  Laterality: N/A;    Family History  Problem Relation Age of Onset  . Colon cancer Father        metastatic  . Other Father        DJD  . Arthritis Father 74  . Healthy Mother        Living-87  . Prostate cancer Brother   . Heart attack Paternal Grandfather   . Heart disease Paternal Grandfather   . Cancer Paternal Uncle   . Colon cancer Sister   . Hypertension Daughter        x1  . Healthy Daughter        x1  . Esophageal cancer Neg Hx   . Stomach cancer Neg Hx   . Rectal cancer Neg Hx     SOCIAL HISTORY: Social History   Tobacco Use  . Smoking status: Former  Smoker    Packs/day: 1.00    Years: 15.00    Pack years: 15.00    Types: Cigarettes    Quit date: 01/06/1989    Years since quitting: 30.7  . Smokeless tobacco: Never Used  . Tobacco comment: chewed some in early 20's  Substance Use Topics  . Alcohol use: Not Currently    No Known Allergies  Current Outpatient Medications  Medication Sig Dispense Refill  . amoxicillin (AMOXIL) 500 MG capsule Take 4 tablets (2000 mg) ONE HOUR prior to any dental procedure or cleaning (Patient taking differently: Take 2,000 mg by mouth See admin instructions. Take 2000 mg by mouth one hour prior to any dental procedure or  cleaning) 8 capsule 2  . aspirin EC 81 MG tablet Take 1 tablet (81 mg total) by mouth daily. Swallow whole. 90 tablet 3  . atorvastatin (LIPITOR) 20 MG tablet Take 1 tablet (20 mg total) by mouth daily. 90 tablet 3  . cyclobenzaprine (FLEXERIL) 5 MG tablet 1 tab po q hs as needed neck pain (Patient taking differently: Take 5 mg by mouth at bedtime as needed (neck pain). ) 10 tablet 0  . fluticasone (FLONASE) 50 MCG/ACT nasal spray SPRAY 2 SPRAYS INTO EACH NOSTRIL EVERY DAY (Patient taking differently: Place 2 sprays into both nostrils daily as needed for allergies. ) 48 mL 1  . HYDROcodone-acetaminophen (NORCO/VICODIN) 5-325 MG tablet Take 1 tablet by mouth every 6 (six) hours as needed for moderate pain. 8 tablet 0  . lisinopril (ZESTRIL) 5 MG tablet TAKE 1 TABLET BY MOUTH EVERY DAY 30 tablet 0  . albuterol (VENTOLIN HFA) 108 (90 Base) MCG/ACT inhaler Inhale 2 puffs into the lungs every 6 (six) hours as needed. (Patient not taking: Reported on 08/23/2019) 18 g 0   No current facility-administered medications for this visit.    REVIEW OF SYSTEMS:  [X]  denotes positive finding, [ ]  denotes negative finding Cardiac  Comments:  Chest pain or chest pressure:    Shortness of breath upon exertion:    Short of breath when lying flat:    Irregular heart rhythm:        Vascular    Pain in calf, thigh, or hip brought on by ambulation:    Pain in feet at night that wakes you up from your sleep:     Blood clot in your veins:    Leg swelling:         Pulmonary    Oxygen at home:    Productive cough:     Wheezing:         Neurologic    Sudden weakness in arms or legs:     Sudden numbness in arms or legs:     Sudden onset of difficulty speaking or slurred speech:    Temporary loss of vision in one eye:     Problems with dizziness:         Gastrointestinal    Blood in stool:     Vomited blood:         Genitourinary    Burning when urinating:     Blood in urine:        Psychiatric      Major depression:         Hematologic    Bleeding problems:    Problems with blood clotting too easily:        Skin    Rashes or ulcers:        Constitutional  Fever or chills:      PHYSICAL EXAM: Vitals:   10/11/19 1100 10/11/19 1104  BP: 116/70 115/72  Pulse: 80 77  Resp: 18   Temp: 97.6 F (36.4 C)   TempSrc: Temporal   SpO2: 97%   Weight: 197 lb (89.4 kg)   Height: 5\' 11"  (1.803 m)     GENERAL: The patient is a well-nourished male, in no acute distress. The vital signs are documented above. CARDIAC: There is a regular rate and rhythm.  VASCULAR:  Right neck incision clean dry and intact, no hematoma PULMONARY: There is good air exchange bilaterally without wheezing or rales. MUSCULOSKELETAL: There are no major deformities or cyanosis. NEUROLOGIC: No focal weakness or paresthesias are detected.  Cranial nerves II through XII grossly intact SKIN: There are no ulcers or rashes noted. PSYCHIATRIC: The patient has a normal affect.  DATA:   None  Assessment/Plan:  74 year old male status post right carotid endarterectomy on 09/19/2019 for an asymptomatic high-grade stenosis.  On follow-up today he is doing quite well and is neurologically intact.  Neck incision is healing without issue.  Discussed he follow-up in 9 months with carotid duplex for ongoing surveillance here in our office.  Last duplex in August 2021 showed a 40 to 59% left ICA stenosis.  Discussed importance of continuing aspirin and statin.   September 2021, MD [facility] Office: [phone]   [phone]

## 2019-10-13 ENCOUNTER — Other Ambulatory Visit: Payer: Self-pay | Admitting: *Deleted

## 2019-10-13 ENCOUNTER — Other Ambulatory Visit: Payer: Self-pay | Admitting: Medical

## 2019-10-13 DIAGNOSIS — I6523 Occlusion and stenosis of bilateral carotid arteries: Secondary | ICD-10-CM

## 2019-11-02 ENCOUNTER — Other Ambulatory Visit: Payer: Self-pay | Admitting: Medical

## 2019-11-13 ENCOUNTER — Other Ambulatory Visit: Payer: Self-pay | Admitting: Medical

## 2019-12-02 ENCOUNTER — Other Ambulatory Visit: Payer: Self-pay | Admitting: Medical

## 2019-12-05 ENCOUNTER — Other Ambulatory Visit: Payer: Self-pay

## 2019-12-05 ENCOUNTER — Ambulatory Visit: Payer: PPO | Admitting: Pharmacist

## 2019-12-05 VITALS — BP 147/62 | HR 65 | Temp 98.1°F | Wt 201.6 lb

## 2019-12-05 DIAGNOSIS — I10 Essential (primary) hypertension: Secondary | ICD-10-CM

## 2019-12-05 DIAGNOSIS — E782 Mixed hyperlipidemia: Secondary | ICD-10-CM

## 2019-12-05 NOTE — Patient Instructions (Addendum)
Visit Information  Goals Addressed            This Visit's Progress   . Chronic Care Management Pharmacy Care Plan       CARE PLAN ENTRY (see longitudinal plan of care for additional care plan information)  Current Barriers:  . Chronic Disease Management support, education, and care coordination needs related to Hypertension, Hyperlipidemia/CAD, Allergic Rhinitis   Hypertension BP Readings from Last 3 Encounters:  12/05/19 (!) 159/62  10/11/19 115/72  09/20/19 (!) 109/51   . Pharmacist Clinical Goal(s): o Over the next 180 days, patient will work with PharmD and providers to maintain BP goal <130/80 . Current regimen:  o Lisinopril 5mg  daily . Interventions: o Requested patient to check BP 2-3 times per week and record . Patient self care activities - Over the next 180 days, patient will: o Check BP 2-3 times per week, document, and provide at future appointments o Ensure daily salt intake < 2300 mg/Chase Knebel  Hyperlipidemia Lab Results  Component Value Date/Time   LDLCALC 67 01/26/2019 07:33 AM   LDLDIRECT 95 11/04/2013 10:38 AM   . Pharmacist Clinical Goal(s): o Over the next 180 days, patient will work with PharmD and providers to maintain LDL goal <70 . Current regimen:  o Atorvastatin 20mg  daily . Patient self care activities - Over the next 180 days, patient will: o Maintain cholesterol medication regimen.   Medication management . Pharmacist Clinical Goal(s): o Over the next 180 days, patient will work with PharmD and providers to maintain optimal medication adherence . Current pharmacy: [facility] . Interventions o Comprehensive medication review performed. o Continue current medication management strategy . Patient self care activities - Over the next 180 days, patient will: o Focus on medication adherence by filling medications appropriately  o Take medications as prescribed o Report any questions or concerns to PharmD and/or provider(s)  Please see past updates  related to this goal by clicking on the "Past Updates" button in the selected goal         Print copy of patient instructions, educational materials, and care plan provided in person.  Telephone follow up appointment with pharmacy team member scheduled for: 06/01/2020  Jerry Ferguson, PharmD Clinical Pharmacist [facility] [phone]   How to Take Your Blood Pressure You can take your blood pressure at home with a machine. You may need to check your blood pressure at home:  To check if you have high blood pressure (hypertension).  To check your blood pressure over time.  To make sure your blood pressure medicine is working. Supplies needed: You will need a blood pressure machine, or monitor. You can buy one at a drugstore or online. When choosing one:  Choose one with an arm cuff.  Choose one that wraps around your upper arm. Only one finger should fit between your arm and the cuff.  Do not choose one that measures your blood pressure from your wrist or finger. Your doctor can suggest a monitor. How to prepare Avoid these things for 30 minutes before checking your blood pressure:  Drinking caffeine.  Drinking alcohol.  Eating.  Smoking.  Exercising. Five minutes before checking your blood pressure:  Pee.  Sit in a dining chair. Avoid sitting in a soft couch or armchair.  Be quiet. Do not talk. How to take your blood pressure Follow the instructions that came with your machine. If you have a digital blood pressure monitor, these may be the instructions: 1. Sit  up straight. 2. Place your feet on the floor. Do not cross your ankles or legs. 3. Rest your left arm at the level of your heart. You may rest it on a table, desk, or chair. 4. Pull up your shirt sleeve. 5. Wrap the blood pressure cuff around the upper part of your left arm. The cuff should be 1 inch (2.5 cm) above your elbow. It is best to wrap the cuff around bare  skin. 6. Fit the cuff snugly around your arm. You should be able to place only one finger between the cuff and your arm. 7. Put the cord inside the groove of your elbow. 8. Press the power button. 9. Sit quietly while the cuff fills with air and loses air. 10. Write down the numbers on the screen. 11. Wait 2-3 minutes and then repeat steps 1-10. What do the numbers mean? Two numbers make up your blood pressure. The first number is called systolic pressure. The second is called diastolic pressure. An example of a blood pressure reading is "120 over 80" (or 120/80). If you are an adult and do not have a medical condition, use this guide to find out if your blood pressure is normal: Normal  First number: below 120.  Second number: below 80. Elevated  First number: 120-129.  Second number: below 80. Hypertension stage 1  First number: 130-139.  Second number: 80-89. Hypertension stage 2  First number: 140 or above.  Second number: 90 or above. Your blood pressure is above normal even if only the top or bottom number is above normal. Follow these instructions at home:  Check your blood pressure as often as your doctor tells you to.  Take your monitor to your next doctor's appointment. Your doctor will: ? Make sure you are using it correctly. ? Make sure it is working right.  Make sure you understand what your blood pressure numbers should be.  Tell your doctor if your medicines are causing side effects. Contact a doctor if:  Your blood pressure keeps being high. Get help right away if:  Your first blood pressure number is higher than 180.  Your second blood pressure number is higher than 120. This information is not intended to replace advice given to you by your health care provider. Make sure you discuss any questions you have with your health care provider. Document Revised: 12/05/2016 Document Reviewed: 06/01/2015 Elsevier Patient Education  2020 [employer].

## 2019-12-05 NOTE — Chronic Care Management (AMB) (Addendum)
Chronic Care Management Pharmacy  Name: Jerry Ferguson  MRN: [id number] DOB: [birth year]  Chief Complaint/ HPI  Jerry Ferguson,  74 y.o. , male presents for their Follow-Up CCM visit with the clinical pharmacist In office (scheduled as phone visit, but patient arrived in clinic).  PCP : Mackie Pai, PA-C  Their chronic conditions include:  Hypertension, Hyperlipidemia/CAD, Allergic Rhinitis  Office Visits: None since last CCM visit on 05/31/19.   Consult Visit: 10/11/19: Vascular Surgery visit w/ Dr. Carlis Abbott - Postop R carotid endarterectomy on 09/19/19. Discussed importance of continuation of aspirin and statin. RTC 9 months.   08/23/19: Vascular Surgery visit w/ Dr. Carlis Abbott - Discussed plan to proceed with right carotid endarterectomy for stroke prevention.  07/27/19: Cardio visit w/ Angelena Form, PA-C - D/C plavix and start aspirin 37m. Complaint of dyspnea. Will get lexiscan nuclear stress test. Amoxicillin 20096mbefore any dental procedures or cleanings.   Medications: Outpatient Encounter Medications as of 12/05/2019  Medication Sig   albuterol (VENTOLIN HFA) 108 (90 Base) MCG/ACT inhaler Inhale 2 puffs into the lungs every 6 (six) hours as needed. (Patient not taking: Reported on 08/23/2019)   amoxicillin (AMOXIL) 500 MG capsule Take 4 tablets (2000 mg) ONE HOUR prior to any dental procedure or cleaning (Patient taking differently: Take 2,000 mg by mouth See admin instructions. Take 2000 mg by mouth one hour prior to any dental procedure or cleaning)   aspirin EC 81 MG tablet Take 1 tablet (81 mg total) by mouth daily. Swallow whole.   atorvastatin (LIPITOR) 20 MG tablet Take 1 tablet (20 mg total) by mouth daily.   cyclobenzaprine (FLEXERIL) 5 MG tablet 1 tab po q hs as needed neck pain (Patient taking differently: Take 5 mg by mouth at bedtime as needed (neck pain). )   fluticasone (FLONASE) 50 MCG/ACT nasal spray SPRAY 2 SPRAYS INTO EACH NOSTRIL EVERY Chanti Golubski (Patient taking  differently: Place 2 sprays into both nostrils daily as needed for allergies. )   HYDROcodone-acetaminophen (NORCO/VICODIN) 5-325 MG tablet Take 1 tablet by mouth every 6 (six) hours as needed for moderate pain.   lisinopril (ZESTRIL) 5 MG tablet TAKE 1 TABLET (5 MG TOTAL) BY MOUTH DAILY. NEEDS OV   No facility-administered encounter medications on file as of 12/05/2019.   SDOH Screenings   Alcohol Screen:    Last Alcohol Screening Score (AUDIT): Not on file  Depression (PHQ2-9): Low Risk    PHQ-2 Score: 0  Financial Resource Strain: Low Risk    Difficulty of Paying Living Expenses: Not hard at all  Food Insecurity:    Worried About RuCharity fundraisern the Last Year: Not on file   [employer] Food in the Last Year: Not on file  Housing:    Last Housing Risk Score: Not on file  Physical Activity:    Days of Exercise per Week: Not on file   Minutes of Exercise per Session: Not on file  Social Connections:    Frequency of Communication with Friends and Family: Not on file   Frequency of Social Gatherings with Friends and Family: Not on file   Attends Religious Services: Not on file   Active Member of Clubs or Organizations: Not on file   Attends ClArchivisteetings: Not on file   Marital Status: Not on file  Stress:    Feeling of Stress : Not on file  Tobacco Use: Medium Risk   Smoking Tobacco Use: Former Smoker   Smokeless Tobacco Use:  Never Used  Transportation Needs:    Film/video editor (Medical): Not on file   Lack of Transportation (Non-Medical): Not on file    Current Diagnosis/Assessment:  Goals Addressed             This Visit's Progress    [place] (see longitudinal plan of care for additional care plan information)  Current Barriers:  Chronic Disease Management support, education, and care coordination needs related to Hypertension, Hyperlipidemia/CAD, Allergic Rhinitis    Hypertension BP Readings from Last 3 Encounters:  12/05/19 (!) 159/62  10/11/19 115/72  09/20/19 (!) 109/51   Pharmacist Clinical Goal(s): Over the next 180 days, patient will work with PharmD and providers to maintain BP goal <130/80 Current regimen:  Lisinopril 65m daily Interventions: Requested patient to check BP 2-3 times per week and record Patient self care activities - Over the next 180 days, patient will: Check BP 2-3 times per week, document, and provide at future appointments Ensure daily salt intake < 2300 mg/Beonka Amesquita  Hyperlipidemia Lab Results  Component Value Date/Time   LDLCALC 67 01/26/2019 07:33 AM   LDLDIRECT 95 11/04/2013 10:38 AM   Pharmacist Clinical Goal(s): Over the next 180 days, patient will work with PharmD and providers to maintain LDL goal <70 Current regimen:  Atorvastatin 260mdaily Patient self care activities - Over the next 180 days, patient will: Maintain cholesterol medication regimen.   Medication management Pharmacist Clinical Goal(s): Over the next 180 days, patient will work with PharmD and providers to maintain optimal medication adherence [facility] Interventions Comprehensive medication review performed. Continue current medication management strategy Patient self care activities - Over the next 180 days, patient will: Focus on medication adherence by filling medications appropriately  Take medications as prescribed Report any questions or concerns to PharmD and/or provider(s)  Please see past updates related to this goal by clicking on the "Past Updates" button in the selected goal         Social Hx: [place] then his mom passed away 202019/03/17[place] who [place] 2003[place]17-21 Formerly in the [employer] [place] wife [place] years [place]. Lives alone 2 children. 4 grandsons (2 by each daughter). One grandson is autistic.  No pets. Used to have  a little dog, but is unsure of what happened to him.   Uses a pill box.   Hypertension   Blood pressure goal <130/80  CMP Latest Ref Rng & Units 09/20/2019 09/16/2019 01/26/2019  Glucose 70 - 99 mg/dL 115(H) 95 101(H)  BUN 8 - 23 mg/dL '14 13 18  ' Creatinine 0.61 - 1.24 mg/dL 1.10 1.07 1.06  Sodium 135 - 145 mmol/L 143 140 143  Potassium 3.5 - 5.1 mmol/L 4.0 3.9 4.3  Chloride 98 - 111 mmol/L 112(H) 107 105  CO2 22 - 32 mmol/L '23 24 23  ' Calcium 8.9 - 10.3 mg/dL 8.3(L) 9.0 9.1  Total Protein 6.5 - 8.1 g/dL - 6.7 6.7  Total Bilirubin 0.3 - 1.2 mg/dL - 1.3(H) 0.9  Alkaline Phos 38 - 126 U/L - 63 72  AST 15 - 41 U/L - 19 16  ALT 0 - 44 U/L - 16 15   Kidney Function Lab Results  Component Value Date/Time   CREATININE 1.10 09/20/2019 03:40 AM   CREATININE 1.07 09/16/2019 12:30 PM   CREATININE 0.97 11/04/2013 10:38 AM  CREATININE 1.02 05/04/2013 03:53 PM   GFR 66.27 12/15/2018 12:08 PM   GFRNONAA >60 09/20/2019 03:40 AM   GFRNONAA 80 11/04/2013 10:38 AM   GFRAA >60 09/20/2019 03:40 AM   GFRAA >89 11/04/2013 10:38 AM    Office blood pressures are  BP Readings from Last 3 Encounters:  12/05/19 (!) 147/62  10/11/19 115/72  09/20/19 (!) 109/51   Patient has failed these meds in the past: None noted  Patient is currently controlled on the following medications:  Lisinopril 82m daily  Per Dispense Report:  Lisinopril 04/08/19: 90 DS 01/13/19: 90 DS  Patient checks BP at home infrequently  Patient home BP readings are ranging: N/A  We discussed  Blood pressure goals    Update 12/05/19 Purchased a BP cuff? Yes, but not checking regularly; reports 1403systolic with HR of 66. He can't recall diastolic Denies headache chest pain ,dizziness Requested pt to check more regularly at home Noted checked pt bp with machine vs manual.   Plan -Continue current medications  -Check BP 2-3 times per week and record  Hyperlipidemia/CAD   LDL goal <70   Lipid Panel     Component  Value Date/Time   CHOL 134 01/26/2019 0733   TRIG 69 01/26/2019 0733   HDL 53 01/26/2019 0733   LDLCALC 67 01/26/2019 0733   LDLDIRECT 95 11/04/2013 1038    The 10-year ASCVD risk score (Mikey BussingDC Jr., et al., 2013) is: 28.4%   Values used to calculate the score:     Age: [age]years     Sex: Male     Is Non-Hispanic African American: No     Diabetic: No     Tobacco smoker: No     Systolic Blood Pressure: 1474mmHg     Is BP treated: Yes     HDL Cholesterol: 53 mg/dL     Total Cholesterol: 134 mg/dL   Patient has failed these meds in past: .None noted  Patient is currently controlled on the following medications:  Atorvastatin 254mdaily Aspirin 8128maily  Followed by Cardio  Per Dispense Report: Atorvastatin 03/27/19: 90 DS 12/27/19: 90 DS  Clopidogrel 05/18/19: 90 DS 02/19/19: 90 DS  Patient states that he has discussed the duration of therapy for plavix and was told to continue it. States he was originally taking aspirin and then plavix was added and eventually aspirin was D/C'd.   We discussed:   Usual course of plavix dosing post TAVR procedure noting that cardio is managing this    Update 12/05/19 Plavix D/C and aspirin 75m65marted per cardio.  Plan -Continue current medications   Miscellaneous Meds  Cyclobenzaprine Albuterol (doesn't use often; last used a week ago)  KaneMelvenia Beam, PharmD Clinical Pharmacist [facility]-[phone] collaborated with the care management provider regarding care management and care coordination activities outlined in this encounter and have reviewed this encounter including documentation in the note and care plan. I am certifying that I agree with the content of this note and encounter as supervising provider.  EdwaMackie Pai-C

## 2019-12-14 ENCOUNTER — Other Ambulatory Visit: Payer: Self-pay | Admitting: Medical

## 2019-12-20 ENCOUNTER — Telehealth: Payer: Self-pay | Admitting: Pharmacist

## 2019-12-20 NOTE — Progress Notes (Addendum)
Chronic Care Management Pharmacy Assistant   Name: Jerry Ferguson  MRN: [id number] DOB: 1945-05-01  Reason for Encounter: HTN Disease State  Patient Questions:  1.  Have you seen any other providers since your last visit? No  2.  Any changes in your medicines or health? No   PCP : Esperanza Richters, PA-C   Their chronic conditions include:  Hypertension, Hyperlipidemia/CAD, Allergic Rhinitis.  Office Visits: None since their last CCM visit with the clinical pharmacist on 12-05-2019.  Consults: None since their last CCM visit with the clinical pharmacist on 12-05-2019.  Allergies:  No Known Allergies  Medications: Outpatient Encounter Medications as of 12/20/2019  Medication Sig   albuterol (VENTOLIN HFA) 108 (90 Base) MCG/ACT inhaler Inhale 2 puffs into the lungs every 6 (six) hours as needed. (Patient not taking: Reported on 08/23/2019)   amoxicillin (AMOXIL) 500 MG capsule Take 4 tablets (2000 mg) ONE HOUR prior to any dental procedure or cleaning (Patient taking differently: Take 2,000 mg by mouth See admin instructions. Take 2000 mg by mouth one hour prior to any dental procedure or cleaning)   aspirin EC 81 MG tablet Take 1 tablet (81 mg total) by mouth daily. Swallow whole.   atorvastatin (LIPITOR) 20 MG tablet Take 1 tablet (20 mg total) by mouth daily.   cyclobenzaprine (FLEXERIL) 5 MG tablet 1 tab po q hs as needed neck pain (Patient taking differently: Take 5 mg by mouth at bedtime as needed (neck pain). )   fluticasone (FLONASE) 50 MCG/ACT nasal spray SPRAY 2 SPRAYS INTO EACH NOSTRIL EVERY DAY (Patient taking differently: Place 2 sprays into both nostrils daily as needed for allergies. )   HYDROcodone-acetaminophen (NORCO/VICODIN) 5-325 MG tablet Take 1 tablet by mouth every 6 (six) hours as needed for moderate pain.   lisinopril (ZESTRIL) 5 MG tablet TAKE 1 TABLET (5 MG TOTAL) BY MOUTH DAILY. NEEDS OV   No facility-administered encounter medications on file as of  12/20/2019.    Current Diagnosis: Patient Active Problem List   Diagnosis Date Noted   Carotid stenosis, right 09/19/2019   Pulmonary nodules    S/P TAVR (transcatheter aortic valve replacement)    Coronary artery disease involving native coronary artery of native heart without angina pectoris    Prostate cancer screening 11/04/2013   Screening for ischemic heart disease 11/04/2013   Carotid artery disease (HCC) 07/15/2013   Severe aortic stenosis 11/28/2012   Essential hypertension 11/28/2012   Weakness generalized 05/14/2010   OVERWEIGHT 02/27/2008   HLD (hyperlipidemia) 11/26/2007   GERD 11/26/2007   DEGENERATIVE JOINT DISEASE 11/26/2007   LIVER FUNCTION TESTS, ABNORMAL, HX OF 11/26/2007    Goals Addressed   None    Reviewed chart prior to disease state call. Spoke with patient regarding BP  Recent Office Vitals: BP Readings from Last 3 Encounters:  12/05/19 (!) 147/62  10/11/19 115/72  09/20/19 (!) 109/51   Pulse Readings from Last 3 Encounters:  12/05/19 65  10/11/19 77  09/20/19 65    Wt Readings from Last 3 Encounters:  12/05/19 201 lb 9.6 oz (91.4 kg)  10/11/19 197 lb (89.4 kg)  09/19/19 205 lb 14.6 oz (93.4 kg)     Kidney Function Lab Results  Component Value Date/Time   CREATININE 1.10 09/20/2019 03:40 AM   CREATININE 1.07 09/16/2019 12:30 PM   CREATININE 0.97 11/04/2013 10:38 AM   CREATININE 1.02 05/04/2013 03:53 PM   GFR 66.27 12/15/2018 12:08 PM   GFRNONAA >60 09/20/2019 03:40 AM  GFRNONAA 80 11/04/2013 10:38 AM   GFRAA >60 09/20/2019 03:40 AM   GFRAA >89 11/04/2013 10:38 AM    BMP Latest Ref Rng & Units 09/20/2019 09/16/2019 01/26/2019  Glucose 70 - 99 mg/dL 409(W) 95 119(J)  BUN 8 - 23 mg/dL 14 13 18   Creatinine 0.61 - 1.24 mg/dL 4.78 2.95  BUN/Creat Ratio 10 - 24 - - 17  Sodium 135 - 145 mmol/L 143 140 143  Potassium 3.5 - 5.1 mmol/L 4.0 3.9 4.3  Chloride 98 - 111 mmol/L 112(H) 107 105  CO2 22 - 32 mmol/L 23 24 23   Calcium 8.9 -  10.3 mg/dL 8.3(L) 9.0 9.1    Current antihypertensive regimen:  Lisinopril 5 mg daily  How often are you checking your Blood Pressure? Patient was asked to check BP 2-3 times per week and record 1-2x per week   Current home BP readings: 132/64, 129/63, 134/70, 128/68, 134/69, 139/67, 120/55, 125/62.  What recent interventions/DTPs have been made by any provider to improve Blood Pressure control since last CPP Visit: none  Any recent hospitalizations or ED visits since last visit with CPP? No   What diet changes have been made to improve Blood Pressure Control?  None. Patient states he eats out a lot. Usually has restaurant meals to go.  What exercise is being done to improve your Blood Pressure Control?  Patient reports being active with house work, working on automobiles, and yard work.  Adherence Review: Is the patient currently on ACE/ARB medication? Yes Lisinopril 5mg  daily Does the patient have >5 day gap between last estimated fill dates? No   Follow-Up:  Pharmacist Review   6.21, [facility] Clinical Pharmacist Assistant [phone]  3 minutes spent in review, coordination, and documentation.   Reviewed by: Corwin Levins, PharmD, BCACP Clinical Pharmacist [facility] [phone]

## 2020-01-04 ENCOUNTER — Other Ambulatory Visit: Payer: Self-pay | Admitting: Medical

## 2020-01-04 DIAGNOSIS — R0989 Other specified symptoms and signs involving the circulatory and respiratory systems: Secondary | ICD-10-CM

## 2020-01-04 DIAGNOSIS — I779 Disorder of arteries and arterioles, unspecified: Secondary | ICD-10-CM

## 2020-01-04 DIAGNOSIS — I10 Essential (primary) hypertension: Secondary | ICD-10-CM

## 2020-01-04 DIAGNOSIS — E785 Hyperlipidemia, unspecified: Secondary | ICD-10-CM

## 2020-01-04 DIAGNOSIS — I350 Nonrheumatic aortic (valve) stenosis: Secondary | ICD-10-CM

## 2020-01-07 ENCOUNTER — Other Ambulatory Visit: Payer: Self-pay | Admitting: Medical

## 2020-01-17 ENCOUNTER — Telehealth: Payer: Self-pay

## 2020-01-17 MED ORDER — LISINOPRIL 5 MG PO TABS: 5.0000 mg | ORAL_TABLET | Freq: Every day | ORAL | 0 refills | Status: DC

## 2020-01-17 NOTE — Telephone Encounter (Signed)
His bp was up little bit when he saw pharmacist. Would ask him to follow up with me in a month/before he runs out of med. I just sent in 30 tab rx lisinopril. On follow up will get a1c, cmp and lipid panel. If you can give early morning appointment and ask him to come in fasting past midnight.

## 2020-01-17 NOTE — Addendum Note (Signed)
Addended by: Gwenevere Abbot on: 01/17/2020 07:30 PM   Modules accepted: Orders

## 2020-01-17 NOTE — Telephone Encounter (Signed)
  Patient called wants to know if he is able to get a full 30 day refill on Lisinopril because he was only able to pick up 15 tablets , doesn't know if he needed a OV or not    [facility] [address]

## 2020-01-18 ENCOUNTER — Ambulatory Visit: Payer: PPO | Admitting: Medical

## 2020-01-18 NOTE — Telephone Encounter (Signed)
Pt notified and appt made

## 2020-01-20 ENCOUNTER — Other Ambulatory Visit: Payer: Self-pay | Admitting: Medical

## 2020-01-26 ENCOUNTER — Other Ambulatory Visit (HOSPITAL_COMMUNITY): Payer: PPO

## 2020-02-12 ENCOUNTER — Other Ambulatory Visit: Payer: Self-pay | Admitting: Medical

## 2020-02-22 ENCOUNTER — Other Ambulatory Visit: Payer: Self-pay

## 2020-02-22 ENCOUNTER — Ambulatory Visit (HOSPITAL_BASED_OUTPATIENT_CLINIC_OR_DEPARTMENT_OTHER)
Admission: RE | Admit: 2020-02-22 | Discharge: 2020-02-22 | Disposition: A | Discharge status: Home / Self Care | Payer: PPO | Source: Ambulatory Visit | Attending: Medical | Admitting: Medical

## 2020-02-22 ENCOUNTER — Ambulatory Visit (INDEPENDENT_AMBULATORY_CARE_PROVIDER_SITE_OTHER): Payer: PPO | Admitting: Medical

## 2020-02-22 VITALS — BP 136/53 | HR 82 | Resp 20 | Ht 71.0 in | Wt 205.2 lb

## 2020-02-22 DIAGNOSIS — M791 Myalgia, unspecified site: Secondary | ICD-10-CM | POA: Diagnosis not present

## 2020-02-22 DIAGNOSIS — I10 Essential (primary) hypertension: Secondary | ICD-10-CM

## 2020-02-22 DIAGNOSIS — M542 Cervicalgia: Secondary | ICD-10-CM | POA: Diagnosis not present

## 2020-02-22 DIAGNOSIS — E782 Mixed hyperlipidemia: Secondary | ICD-10-CM

## 2020-02-22 DIAGNOSIS — I350 Nonrheumatic aortic (valve) stenosis: Secondary | ICD-10-CM

## 2020-02-22 DIAGNOSIS — R252 Cramp and spasm: Secondary | ICD-10-CM | POA: Diagnosis not present

## 2020-02-22 DIAGNOSIS — I2510 Atherosclerotic heart disease of native coronary artery without angina pectoris: Secondary | ICD-10-CM

## 2020-02-22 DIAGNOSIS — I779 Disorder of arteries and arterioles, unspecified: Secondary | ICD-10-CM

## 2020-02-22 DIAGNOSIS — R739 Hyperglycemia, unspecified: Secondary | ICD-10-CM

## 2020-02-22 DIAGNOSIS — R0989 Other specified symptoms and signs involving the circulatory and respiratory systems: Secondary | ICD-10-CM

## 2020-02-22 DIAGNOSIS — E785 Hyperlipidemia, unspecified: Secondary | ICD-10-CM

## 2020-02-22 LAB — COMPREHENSIVE METABOLIC PANEL
ALT: 16 U/L (ref 0–53)
AST: 17 U/L (ref 0–37)
Albumin: 4.6 g/dL (ref 3.5–5.2)
Alkaline Phosphatase: 72 U/L (ref 39–117)
BUN: 16 mg/dL (ref 6–23)
CO2: 29 mEq/L (ref 19–32)
Calcium: 9.6 mg/dL (ref 8.4–10.5)
Chloride: 104 mEq/L (ref 96–112)
Creatinine, Ser: 1.07 mg/dL (ref 0.40–1.50)
GFR: 68.39 mL/min (ref >60.00)
Glucose, Bld: 102 mg/dL — ABNORMAL HIGH (ref 70–99)
Potassium: 5.1 mEq/L (ref 3.5–5.1)
Sodium: 138 mEq/L (ref 135–145)
Total Bilirubin: 1.4 mg/dL — ABNORMAL HIGH (ref 0.2–1.2)
Total Protein: 7 g/dL (ref 6.0–8.3)

## 2020-02-22 LAB — LIPID PANEL
Cholesterol: 124 mg/dL (ref 0–200)
HDL: 45.6 mg/dL (ref >39.00)
LDL Cholesterol: 65 mg/dL (ref 0–99)
NonHDL: 78.35
Total CHOL/HDL Ratio: 3
Triglycerides: 68 mg/dL (ref 0.0–149.0)
VLDL: 13.6 mg/dL (ref 0.0–40.0)

## 2020-02-22 LAB — SEDIMENTATION RATE: Sed Rate: 4 mm/hr (ref 0–20)

## 2020-02-22 LAB — MAGNESIUM: Magnesium: 2.3 mg/dL (ref 1.5–2.5)

## 2020-02-22 LAB — HEMOGLOBIN A1C: Hgb A1c MFr Bld: 5.4 % (ref 4.6–6.5)

## 2020-02-22 MED ORDER — LISINOPRIL 5 MG PO TABS: 5.0000 mg | ORAL_TABLET | Freq: Every day | ORAL | 3 refills | Status: DC

## 2020-02-22 NOTE — Patient Instructions (Addendum)
Your bp is well controlled today. Will refill your lisinopril 5 mg daily.  For elevated cholesterol will repeat your lipid panel and cmp. Continue current atorvastatin 20 mg daily.  For CAD please follow up your cardiologist.  For elevated sugar will get a1c.  For upper extremity cramp sensation on and off will get cmp and mg level.  For myalgia will get sed rate.  Some neck pain with upper ext symptoms will get cspine xray.  Follow up 1 year of sooner if needed

## 2020-02-22 NOTE — Progress Notes (Signed)
Subjective:    Patient ID: Jerry Ferguson, male    DOB: 11/17/1945, 75 y.o.   MRN: [id number]  HPI  Pt in for follow up.  Pt bp controlled today. No cardiac or neurologic signs or symptoms. Pt is on lisinopril 5 mg daily.   Pt has hx of low hdl. Last lipid panel one year ago controlled.  Pt states some mild fatigue. Minimal short of breath with activity. Pt admits has been sedentary. Not much activity.  Pt states has some cramping sensation to both arms. Occurs over past month and half. Can happen randomly. Some tight sensation in neck/pain. occasional rough sound he thinks can her if turns neck. No radicular pain.     Review of Systems  Constitutional: Negative for chills, fatigue and fever.  HENT: Negative for congestion.   Respiratory: Negative for cough, chest tightness and wheezing.   Cardiovascular: Negative for chest pain and palpitations.  Gastrointestinal: Negative for abdominal pain.  Genitourinary: Negative for dysuria.  Musculoskeletal: Negative for back pain.  Neurological: Negative for dizziness, syncope, weakness, light-headedness and headaches.  Hematological: Negative for adenopathy. Does not bruise/bleed easily.  Psychiatric/Behavioral: Negative for behavioral problems and confusion.     Past Medical History:  Diagnosis Date  . Abnormal liver function test   . Acute bronchitis   . Carotid artery occlusion    Bilateral Bruit  . Cataract   . Coronary artery disease involving native coronary artery of native heart without angina pectoris   . DJD (degenerative joint disease)   . Dyspnea   . GERD (gastroesophageal reflux disease)   . Heart murmur   . History of UTI   . Hypercholesteremia   . Hypertension   . Lumbar back pain   . Overweight(278.02)   . Pulmonary nodules    Multiple small pulmonary nodules scattered throughout both lungs, 12 month CT rec if pt high risk  . S/P TAVR (transcatheter aortic valve replacement)    26 mm Edwards Sapien 3  transcatheter heart valve placed via percutaneous right transfemoral approach   . Severe aortic stenosis   . Severe aortic stenosis      Social History   Socioeconomic History  . Marital status: Widowed    Spouse name: stacey(deceased 05/05/09)  . Number of children: 1  . Years of education: Not on file  . Highest education level: Not on file  Occupational History  . Occupation: welder  Tobacco Use  . Smoking status: Former Smoker    Packs/day: 1.00    Years: 15.00    Pack years: 15.00    Types: Cigarettes    Quit date: 01/06/1989    Years since quitting: 31.1  . Smokeless tobacco: Never Used  . Tobacco comment: chewed some in early 2022-05-06  Vaping Use  . Vaping Use: Never used  Substance and Sexual Activity  . Alcohol use: Not Currently  . Drug use: No  . Sexual activity: Not on file  Other Topics Concern  . Not on file  Social History Narrative   Uncle with prostate cancer?   Social Determinants of Health   Financial Resource Strain: Low Risk   . Difficulty of Paying Living Expenses: Not hard at all  Food Insecurity: Not on file  Transportation Needs: Not on file  Physical Activity: Not on file  Stress: Not on file  Social Connections: Not on file  Intimate Partner Violence: Not on file    Past Surgical History:  Procedure Laterality Date  . APPENDECTOMY    .  CAROTID ENDARTERECTOMY Right 09/19/2019  . COLONOSCOPY    . CORONARY ANGIOGRAPHY N/A 02/04/2018   Procedure: CORONARY ANGIOGRAPHY;  Surgeon: Tonny Bollman, MD;  [facility] INVASIVE CV LAB;  Service: Cardiovascular;  Laterality: N/A;  . ENDARTERECTOMY Right 09/19/2019   Procedure: RIGHT ENDARTERECTOMY CAROTID;  Surgeon: Cephus Shelling, MD;  Location: [facility] OR;  Service: Vascular;  Laterality: Right;  . PATCH ANGIOPLASTY Right 09/19/2019   Procedure: PATCH ANGIOPLASTY RIGHT CAROTID;  Surgeon: Cephus Shelling, MD;  Location: [facility] OR;  Service: Vascular;  Laterality: Right;  . RIGHT HEART CATH N/A 02/04/2018    Procedure: RIGHT HEART CATH;  Surgeon: Tonny Bollman, MD;  Location[facility] INVASIVE CV LAB;  Service: Cardiovascular;  Laterality: N/A;  . TEE WITHOUT CARDIOVERSION N/A 02/23/2018   Procedure: TRANSESOPHAGEAL ECHOCARDIOGRAM (TEE);  Surgeon: Tonny Bollman, MD;  Location: [facility] INVASIVE CV LAB;  Service: Open Heart Surgery;  Laterality: N/A;  . TRANSCATHETER AORTIC VALVE REPLACEMENT, TRANSFEMORAL  02/23/2018  . TRANSCATHETER AORTIC VALVE REPLACEMENT, TRANSFEMORAL N/A 02/23/2018   Procedure: TRANSCATHETER AORTIC VALVE REPLACEMENT, TRANSFEMORAL;  Surgeon: Tonny Bollman, MD;  Location: [facility] INVASIVE CV LAB;  Service: Open Heart Surgery;  Laterality: N/A;    Family History  Problem Relation Age of Onset  . Colon cancer Father        metastatic  . Other Father        DJD  . Arthritis Father 25  . Healthy Mother        Living-87  . Prostate cancer Brother   . Heart attack Paternal Grandfather   . Heart disease Paternal Grandfather   . Cancer Paternal Uncle   . Colon cancer Sister   . Hypertension Daughter        x1  . Healthy Daughter        x1  . Esophageal cancer Neg Hx   . Stomach cancer Neg Hx   . Rectal cancer Neg Hx     No Known Allergies  Current Outpatient Medications on File Prior to Visit  Medication Sig Dispense Refill  . aspirin EC 81 MG tablet Take 1 tablet (81 mg total) by mouth daily. Swallow whole. 90 tablet 3  . atorvastatin (LIPITOR) 20 MG tablet TAKE 1 TABLET BY MOUTH EVERY DAY 90 tablet 3  . cyclobenzaprine (FLEXERIL) 5 MG tablet 1 tab po q hs as needed neck pain (Patient taking differently: Take 5 mg by mouth at bedtime as needed (neck pain).) 10 tablet 0  . fluticasone (FLONASE) 50 MCG/ACT nasal spray SPRAY 2 SPRAYS INTO EACH NOSTRIL EVERY DAY (Patient taking differently: Place 2 sprays into both nostrils daily as needed for allergies.) 48 mL 1  . HYDROcodone-acetaminophen (NORCO/VICODIN) 5-325 MG tablet Take 1 tablet by mouth every 6 (six) hours as needed for  moderate pain. 8 tablet 0  . albuterol (VENTOLIN HFA) 108 (90 Base) MCG/ACT inhaler Inhale 2 puffs into the lungs every 6 (six) hours as needed. (Patient not taking: No sig reported) 18 g 0  . amoxicillin (AMOXIL) 500 MG capsule Take 4 tablets (2000 mg) ONE HOUR prior to any dental procedure or cleaning (Patient not taking: Reported on 02/22/2020) 8 capsule 2   No current facility-administered medications on file prior to visit.    BP (!) 136/53   Pulse 82   Resp 20   Ht 5\' 11"  (1.803 m)   Wt 205 lb 3.2 oz (93.1 kg)   SpO2 98%   BMI 28.62 kg/m       Objective:  Physical Exam  General Mental Status- Alert. General Appearance- Not in acute distress.   Skin General: Color- Normal Color. Moisture- Normal Moisture.  Neck Carotid Arteries- Normal color. Moisture- Normal Moisture. No carotid bruits. No JVD.  Chest and Lung Exam Auscultation: Breath Sounds:-Normal.  Cardiovascular Auscultation:Rythm- Regular. Murmurs & Other Heart Sounds:Auscultation of the heart reveals- No Murmurs.  Abdomen Inspection:-Inspeection Normal. Palpation/Percussion:Note:No mass. Palpation and Percussion of the abdomen reveal- Non Tender, Non Distended + BS, no rebound or guarding.    Neurologic Cranial Nerve exam:- CN III-XII intact(No nystagmus), symmetric smile. Strength:- 5/5 equal and symmetric strength both upper and lower extremities.      Assessment & Plan:  Your bp is well controlled today. Will refill your lisinopril 5 mg daily.  For elevated cholesterol will repeat your lipid panel and cmp. Continue current atorvastatin 20 mg daily.  For CAD please follow up your cardiologist.  For elevated sugar will get a1c.  For upper extremity cramp sensation on and off will get cmp and mg level.  For myalgia will get sed rate.  Some neck pain with upper ext symptoms will get cspine xray.  Follow up 1 year of sooner if needed  [employer], PA-C

## 2020-02-23 MED ORDER — ATORVASTATIN CALCIUM 20 MG PO TABS: 20.0000 mg | ORAL_TABLET | Freq: Every day | ORAL | 3 refills | Status: DC

## 2020-02-23 NOTE — Addendum Note (Signed)
Addended by: Gwenevere Abbot on: 02/23/2020 09:16 AM   Modules accepted: Orders

## 2020-03-05 ENCOUNTER — Telehealth: Payer: Self-pay

## 2020-03-05 NOTE — Telephone Encounter (Signed)
Opened to send patient lab letter

## 2020-03-16 IMAGING — DX DG SHOULDER 2+V*L*
3 series · 3 of 3 positions shown · non-contrast
Comparison: Chest x-ray 07/17/2016.

CLINICAL DATA: Pain left arm.  Limited range of motion.  No injury.

EXAM:
LEFT SHOULDER - 2+ VIEW

[shoulder grashey]
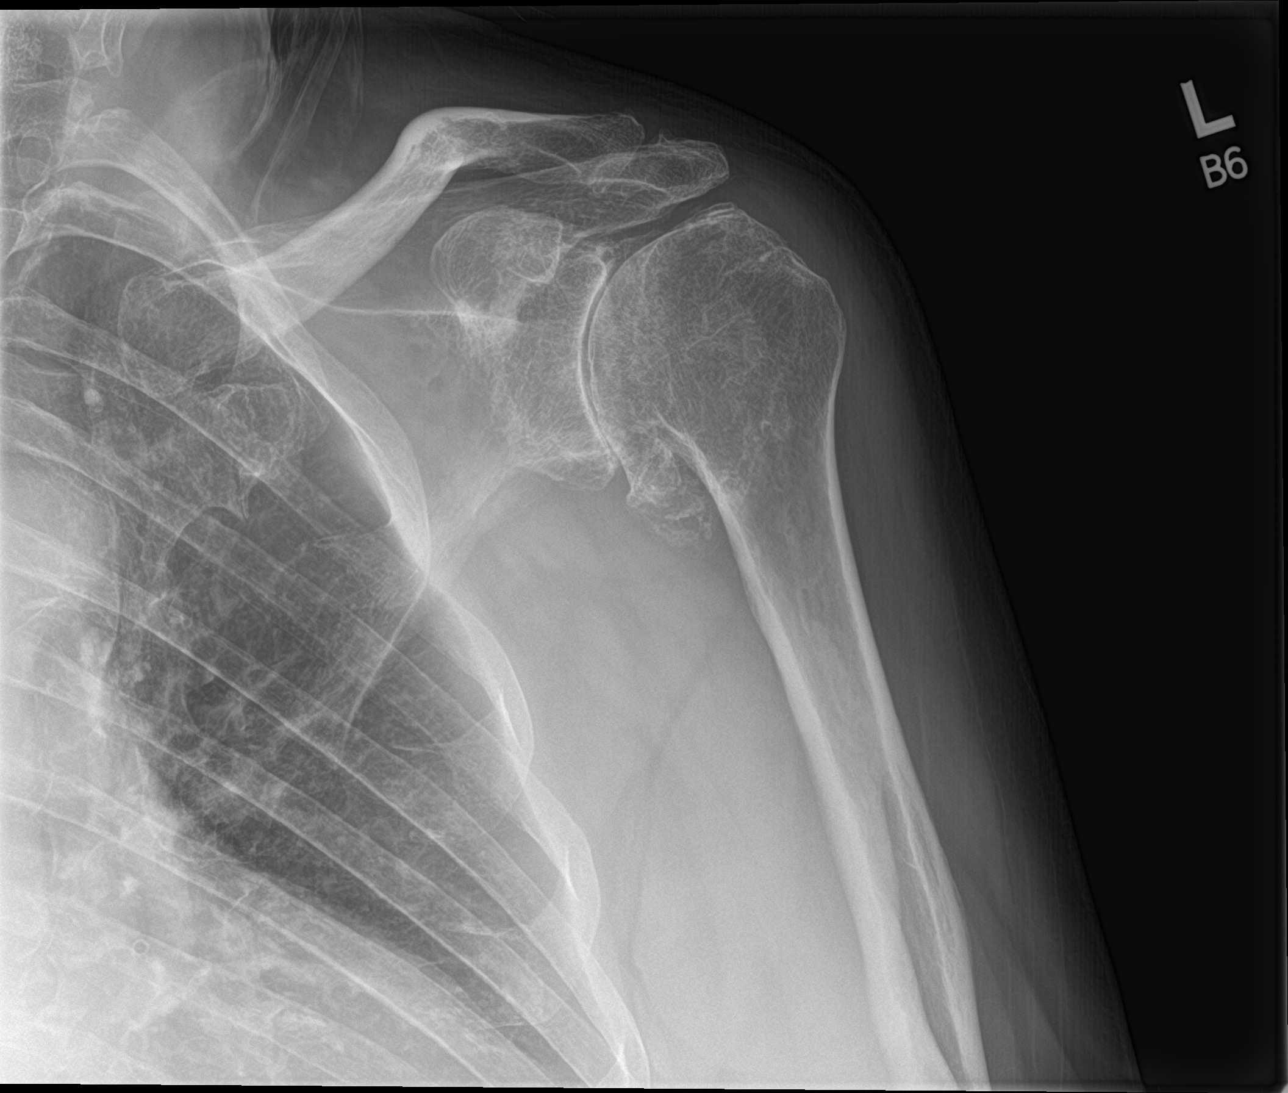

[shoulder y view]
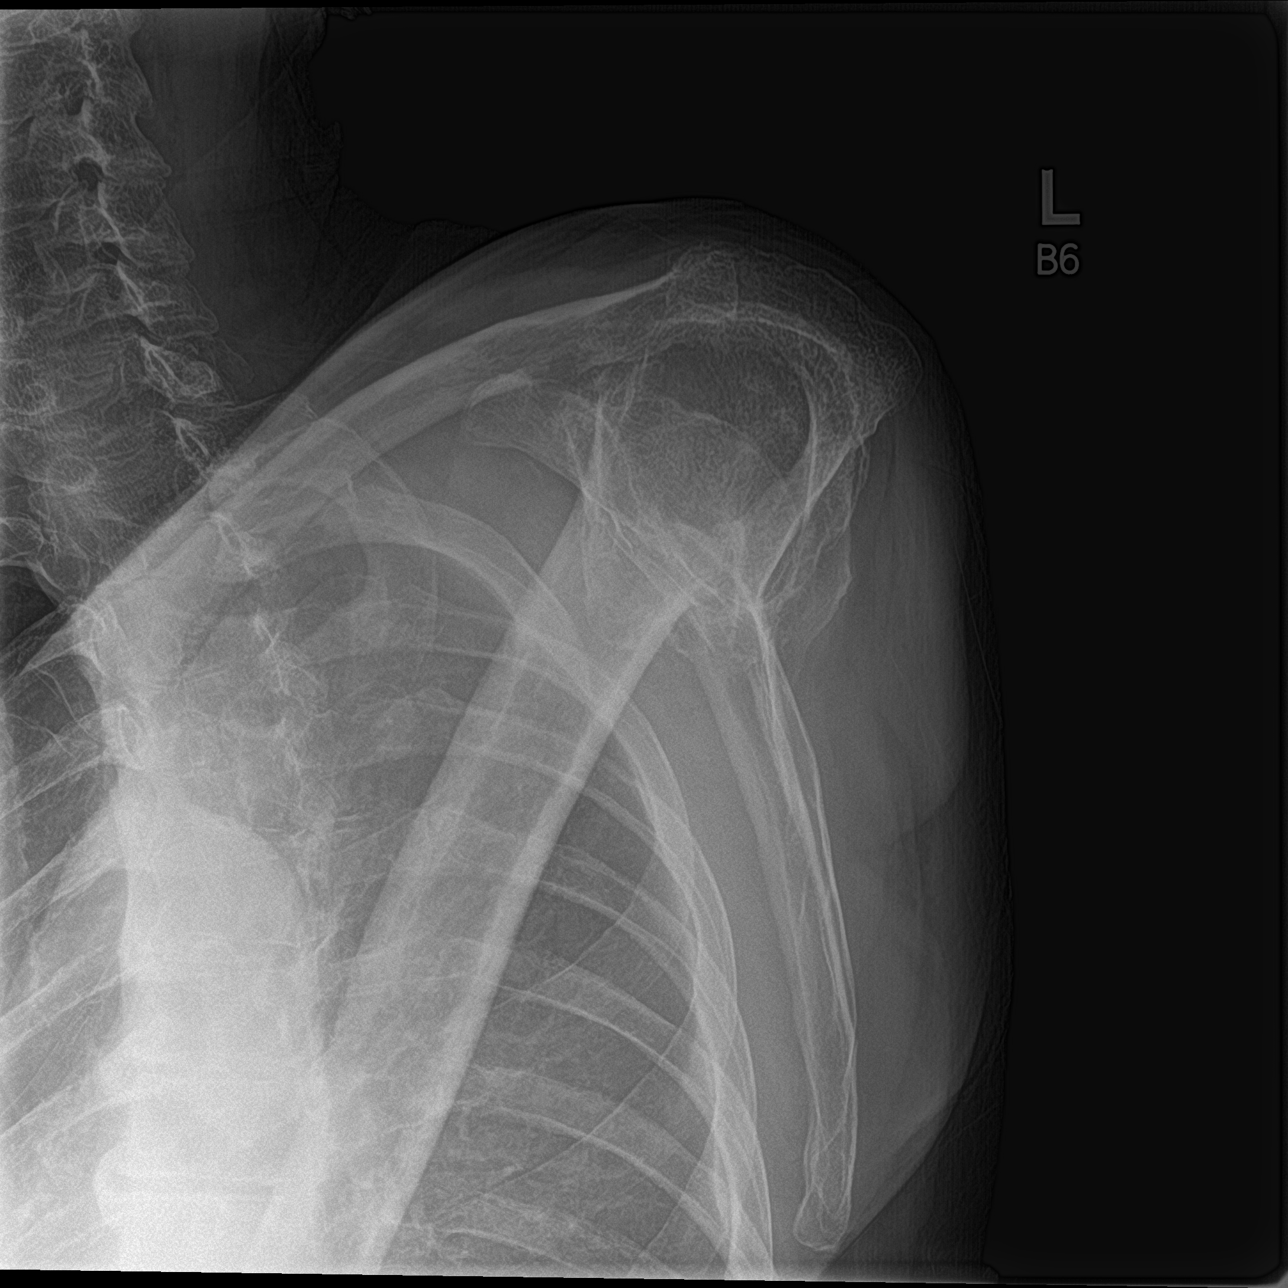

[shoulder axillary]
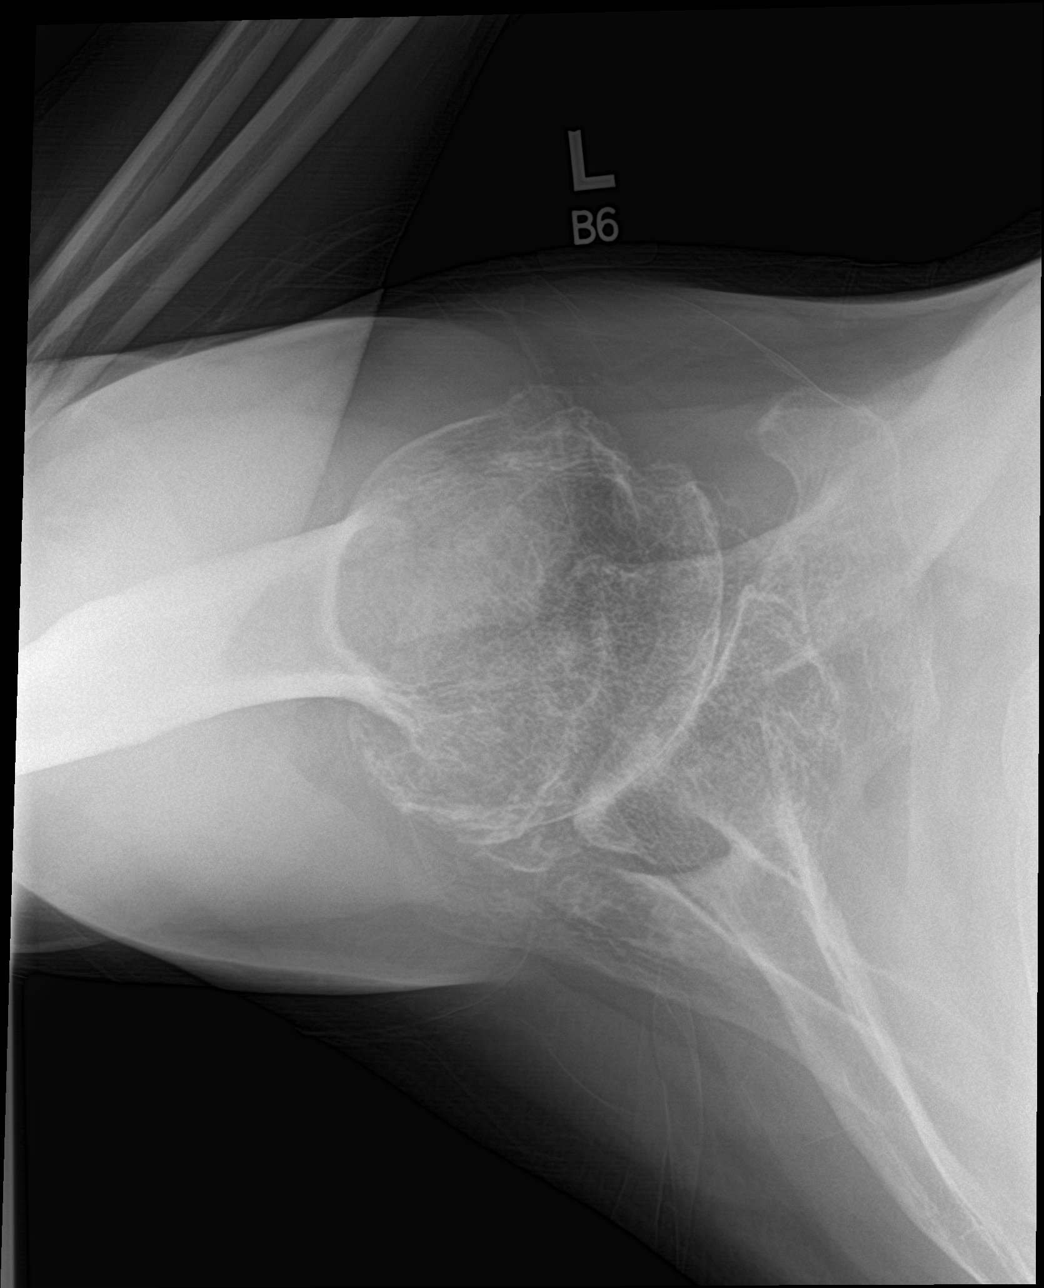

[3 of 3 positions shown; findings below may reference images not displayed]

FINDINGS: Diffuse osteopenia. Prominent acromioclavicular glenohumeral
degenerative change. Loose bodies may be present. No acute
abnormality identified. No evidence of fracture or dislocation.
IMPRESSION: Diffuse osteopenia and severe acromioclavicular glenohumeral
degenerative change. Loose bodies may be present. No acute bony
abnormality identified.

## 2020-04-02 ENCOUNTER — Encounter: Payer: Self-pay | Admitting: Cardiovascular Disease

## 2020-04-02 NOTE — Progress Notes (Signed)
Cardiology Office Note:    Date:  04/03/2020   ID:  Jerry Ferguson, DOB [birth year], MRN [id number]  PCP:  Esperanza Richters, PA-C  Cardiologist:  Kristeen Miss, MD  Electrophysiologist:  None   Referring MD: Marisue Brooklyn   Chief Complaint  Patient presents with  . Aortic Stenosis     Nov. 6, 2019   Jerry Ferguson is a 75 y.o. male with a hx of  HTN, HLD and aorticstenosis.  We were asked to see him by Esperanza Richters, PA-C for further evaluation of his aortic stenosis.   His last echocardiogram was performed June, 2015.  At that time, he had normal left ventricular systolic function.  He had moderate aortic stenosis. The mean aortic valve gradient was 23 mmHg with a peak aortic valve gradient of 41 mmHg. Mildly dilated ascending aorta.  He feels quite well Has mild DOE Mows his lawn without any problem  No CP , no syncope  No cough,  No weight gain Has lost some weight - has been traveling lots   Jan. 20, 2021  Jerry Ferguson is seen today after a several year absence He has recently had a TAVR Echocardiogram from today reveals normal left ventricular systolic function.  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   NYHA class  II  .   Gets short of breath with some activities - yard work )   has been more short of breath since we started Plavix instead of ASA  No angina.    Has RCA disease with collaterals from the left.  Has at least a 50% stenosis in his left circumflex artery peers in some views it appears to be a very tight ostial lesion but other views suggested only moderate in severity.  He has mild disease in his LAD.  He has a tight right carotid stenosis that needs to have a carotid endarterectomy.  Because of personal family issues and Covid he has not been able to get that fixed.   He will call Dr. Burna Forts office   The CT scan shows multiple small pulmonary nodules that are benign.  The CT is unchanged from previous  CT scans and he does not need any further follow-up on this pulmonary nodules.  I have personally reviewed the echo images.  He has mild to moderate aortic insufficiency which is likely to be due to a perivalvular leak.   April 03, 2020: Jerry Ferguson is seen following his TAVR.  No CP ,  Mild dyspnea ,  Knows he needs to exercise     Past Medical History:  Diagnosis Date  . Abnormal liver function test   . Acute bronchitis   . Carotid artery occlusion    Bilateral Bruit  . Cataract   . Coronary artery disease involving native coronary artery of native heart without angina pectoris   . DJD (degenerative joint disease)   . Dyspnea   . GERD (gastroesophageal reflux disease)   . Heart murmur   . History of UTI   . Hypercholesteremia   . Hypertension   . Lumbar back pain   . Overweight(278.02)   . Pulmonary nodules    Multiple small pulmonary nodules scattered throughout both lungs, 12 month CT rec if pt high risk  . S/P TAVR (transcatheter aortic valve replacement)    26 mm Edwards Sapien 3 transcatheter heart valve placed via percutaneous right transfemoral approach   . Severe aortic stenosis   . Severe  aortic stenosis     Past Surgical History:  Procedure Laterality Date  . APPENDECTOMY    . CAROTID ENDARTERECTOMY Right 09/19/2019  . COLONOSCOPY    . CORONARY ANGIOGRAPHY N/A 02/04/2018   Procedure: CORONARY ANGIOGRAPHY;  Surgeon: Tonny Bollman, MD;  Location: [facility] INVASIVE CV LAB;  Service: Cardiovascular;  Laterality: N/A;  . ENDARTERECTOMY Right 09/19/2019   Procedure: RIGHT ENDARTERECTOMY CAROTID;  Surgeon: Cephus Shelling, MD;  Location: [facility] OR;  Service: Vascular;  Laterality: Right;  . PATCH ANGIOPLASTY Right 09/19/2019   Procedure: PATCH ANGIOPLASTY RIGHT CAROTID;  Surgeon: Cephus Shelling, MD;  Location: [facility] OR;  Service: Vascular;  Laterality: Right;  . RIGHT HEART CATH N/A 02/04/2018   Procedure: RIGHT HEART CATH;  Surgeon: Tonny Bollman, MD;  Location: [facility]  INVASIVE CV LAB;  Service: Cardiovascular;  Laterality: N/A;  . TEE WITHOUT CARDIOVERSION N/A 02/23/2018   Procedure: TRANSESOPHAGEAL ECHOCARDIOGRAM (TEE);  Surgeon: Tonny Bollman, MD;  Location: [facility] INVASIVE CV LAB;  Service: Open Heart Surgery;  Laterality: N/A;  . TRANSCATHETER AORTIC VALVE REPLACEMENT, TRANSFEMORAL  02/23/2018  . TRANSCATHETER AORTIC VALVE REPLACEMENT, TRANSFEMORAL N/A 02/23/2018   Procedure: TRANSCATHETER AORTIC VALVE REPLACEMENT, TRANSFEMORAL;  Surgeon: Tonny Bollman, MD;  Location: [facility] INVASIVE CV LAB;  Service: Open Heart Surgery;  Laterality: N/A;    Current Medications: Current Meds  Medication Sig  . albuterol (VENTOLIN HFA) 108 (90 Base) MCG/ACT inhaler Inhale 2 puffs into the lungs every 6 (six) hours as needed.  Marland Kitchen amoxicillin (AMOXIL) 500 MG capsule Take 4 tablets (2000 mg) ONE HOUR prior to any dental procedure or cleaning  . aspirin EC 81 MG tablet Take 1 tablet (81 mg total) by mouth daily. Swallow whole.  Marland Kitchen atorvastatin (LIPITOR) 20 MG tablet Take 1 tablet (20 mg total) by mouth daily.  . cyclobenzaprine (FLEXERIL) 5 MG tablet 1 tab po q hs as needed neck pain  . fluticasone (FLONASE) 50 MCG/ACT nasal spray SPRAY 2 SPRAYS INTO EACH NOSTRIL EVERY DAY  . HYDROcodone-acetaminophen (NORCO/VICODIN) 5-325 MG tablet Take 1 tablet by mouth every 6 (six) hours as needed for moderate pain.  Marland Kitchen lisinopril (ZESTRIL) 5 MG tablet Take 1 tablet (5 mg total) by mouth daily.     Allergies:   Patient has no known allergies.   Social History   Socioeconomic History  . Marital status: Widowed    Spouse name: stacey(deceased 2009/05/12)  . Number of children: 1  . Years of education: Not on file  . Highest education level: Not on file  Occupational History  . Occupation: welder  Tobacco Use  . Smoking status: Former Smoker    Packs/day: 1.00    Years: 15.00    Pack years: 15.00    Types: Cigarettes    Quit date: 01/06/1989    Years since quitting: 31.2  . Smokeless  tobacco: Never Used  . Tobacco comment: chewed some in early May 13, 2022  Vaping Use  . Vaping Use: Never used  Substance and Sexual Activity  . Alcohol use: Not Currently  . Drug use: No  . Sexual activity: Not on file  Other Topics Concern  . Not on file  Social History Narrative   Uncle with prostate cancer?   Social Determinants of Health   Financial Resource Strain: Low Risk   . Difficulty of Paying Living Expenses: Not hard at all  Food Insecurity: Not on file  Transportation Needs: Not on file  Physical Activity: Not on file  Stress: Not on file  Social Connections: Not  on file     Family History: The patient's family history includes Arthritis (age of onset: 48) in his father; Cancer in his paternal uncle; Colon cancer in his father and sister; Healthy in his daughter and mother; Heart attack in his paternal grandfather; Heart disease in his paternal grandfather; Hypertension in his daughter; Other in his father; Prostate cancer in his brother. There is no history of Esophageal cancer, Stomach cancer, or Rectal cancer.  ROS:   Please see the history of present illness.     All other systems reviewed and are negative.  Physical Exam: Blood pressure 118/74, pulse (!) 56, height 5\' 11"  (1.803 m), weight 202 lb 9.6 oz (91.9 kg), SpO2 99 %.  GEN:  Well nourished, well developed in no acute distress HEENT: Normal NECK: No JVD; No carotid bruits LYMPHATICS: No lymphadenopathy CARDIAC: RRR  , soft systolic murmur, soft diastolic murmur  RESPIRATORY:  Clear to auscultation without rales, wheezing or rhonchi  ABDOMEN: Soft, non-tender, non-distended MUSCULOSKELETAL:  No edema; No deformity  SKIN: Warm and dry NEUROLOGIC:  Alert and oriented x 3     EKGs/Labs/Other Studies Reviewed:     EKG:  April 03, 2020: Sinus bradycardia 56.  Nonspecific ST and T wave changes.  n changes.  Recent Labs: 09/20/2019: Hemoglobin 11.4; Platelets 156 02/22/2020: ALT 16; BUN 16; Creatinine,  Ser 1.07; Magnesium 2.3; Potassium 5.1; Sodium 138  Recent Lipid Panel    Component Value Date/Time   CHOL 124 02/22/2020 1225   CHOL 134 01/26/2019 0733   TRIG 68.0 02/22/2020 1225   HDL 45.60 02/22/2020 1225   HDL 53 01/26/2019 0733   CHOLHDL 3 02/22/2020 1225   VLDL 13.6 02/22/2020 1225   LDLCALC 65 02/22/2020 1225   LDLCALC 67 01/26/2019 0733   LDLDIRECT 95 11/04/2013 1038    Physical Exam:       ASSESSMENT:    1. S/P TAVR (transcatheter aortic valve replacement)    PLAN:      1. Aortic stenosis:  S/p TAVR.  He has mild to moderate aortic insufficiency that is likely due to a perivalvular leak.   He is not having any signs or symptoms of aortic stenosis.  His valve sounds great.  2.  Shortness of breath: His breathing is much better. .  3.  Lung nodules: Followed by his primary medical doctor and pulmonary.   4.  Coronary artery disease:  No angina ,  recentl lipids look great     Medication Adjustments/Labs and Tests Ordered: Current medicines are reviewed at length with the patient today.  Concerns regarding medicines are outlined above.  Orders Placed This Encounter  Procedures  . EKG 12-Lead   No orders of the defined types were placed in this encounter.   There are no Patient Instructions on file for this visit.   Signed, 11/06/2013, MD  04/03/2020 2:48 PM    [facility]

## 2020-04-03 ENCOUNTER — Other Ambulatory Visit: Payer: Self-pay

## 2020-04-03 ENCOUNTER — Encounter: Payer: Self-pay | Admitting: Cardiovascular Disease

## 2020-04-03 ENCOUNTER — Ambulatory Visit: Payer: PPO | Admitting: Cardiovascular Disease

## 2020-04-03 ENCOUNTER — Ambulatory Visit (HOSPITAL_COMMUNITY): Payer: PPO | Attending: Internal Medicine

## 2020-04-03 VITALS — BP 118/74 | HR 56 | Ht 71.0 in | Wt 202.6 lb

## 2020-04-03 DIAGNOSIS — Z952 Presence of prosthetic heart valve: Secondary | ICD-10-CM | POA: Insufficient documentation

## 2020-04-03 LAB — ECHOCARDIOGRAM COMPLETE
AR max vel: 4.46 cm2
AV Area VTI: 4.27 cm2
AV Area mean vel: 3.64 cm2
AV Mean grad: 5 mmHg
AV Peak grad: 8 mmHg
Ao pk vel: 1.41 m/s
Area-P 1/2: 4.06 cm2
P 1/2 time: 300 msec
S' Lateral: 2.5 cm

## 2020-04-03 MED ORDER — PERFLUTREN LIPID MICROSPHERE
1.0000 mL | INTRAVENOUS | Status: AC | PRN
  Administered 2020-04-03: 2 mL via INTRAVENOUS

## 2020-04-03 NOTE — Patient Instructions (Signed)
   Medication Instructions:  Your physician recommends that you continue on your current medications as directed. Please refer to the Current Medication list given to you today.  *If you need a refill on your cardiac medications before your next appointment, please call your pharmacy*   Lab Work: none If you have labs (blood work) drawn today and your tests are completely normal, you will receive your results only by: MyChart Message (if you have MyChart) OR A paper copy in the mail If you have any lab test that is abnormal or we need to change your treatment, we will call you to review the results.   Testing/Procedures: none   Follow-Up: At [facility], you and your health needs are our priority.  As part of our continuing mission to provide you with exceptional heart care, we have created designated Provider Care Teams.  These Care Teams include your primary Cardiologist (physician) and Advanced Practice Providers (APPs -  Physician Assistants and Nurse Practitioners) who all work together to provide you with the care you need, when you need it.  We recommend signing up for the patient portal called "MyChart".  Sign up information is provided on this After Visit Summary.  MyChart is used to connect with patients for Virtual Visits (Telemedicine).  Patients are able to view lab/test results, encounter notes, upcoming appointments, etc.  Non-urgent messages can be sent to your provider as well.   To learn more about what you can do with MyChart, go to [email or web address].    Your next appointment:   1 year(s)  The format for your next appointment:   In Person  Provider:   You may see Philip Nahser, MD or one of the following Advanced Practice Providers on your designated Care Team:   Scott Weaver, PA-C Vin Bhagat, PA-C 

## 2020-05-31 ENCOUNTER — Telehealth: Payer: Self-pay | Admitting: Pharmacist

## 2020-05-31 NOTE — Chronic Care Management (AMB) (Signed)
Chronic Care Management Pharmacy Assistant   Name: Jerry Ferguson  MRN: [id number] DOB: 1945/06/25   Reason for Encounter: Disease State-Hypertension    Recent office visits:  02/22/20 Esperanza Richters, PA-C(PCP) General Follow up.   Recent consult visits:  04/03/20 Kristeen Miss, MD(Cardiology) Seen for Aortic Stenosis.  Hospital visits:  None in previous 6 months  Medications: Outpatient Encounter Medications as of 05/31/2020  Medication Sig  . albuterol (VENTOLIN HFA) 108 (90 Base) MCG/ACT inhaler Inhale 2 puffs into the lungs every 6 (six) hours as needed.  Marland Kitchen amoxicillin (AMOXIL) 500 MG capsule Take 4 tablets (2000 mg) ONE HOUR prior to any dental procedure or cleaning  . aspirin EC 81 MG tablet Take 1 tablet (81 mg total) by mouth daily. Swallow whole.  Marland Kitchen atorvastatin (LIPITOR) 20 MG tablet Take 1 tablet (20 mg total) by mouth daily.  . cyclobenzaprine (FLEXERIL) 5 MG tablet 1 tab po q hs as needed neck pain  . fluticasone (FLONASE) 50 MCG/ACT nasal spray SPRAY 2 SPRAYS INTO EACH NOSTRIL EVERY DAY  . HYDROcodone-acetaminophen (NORCO/VICODIN) 5-325 MG tablet Take 1 tablet by mouth every 6 (six) hours as needed for moderate pain.  Marland Kitchen lisinopril (ZESTRIL) 5 MG tablet Take 1 tablet (5 mg total) by mouth daily.   No facility-administered encounter medications on file as of 05/31/2020.   Reviewed chart prior to disease state call. Spoke with patient regarding BP  Recent Office Vitals: BP Readings from Last 3 Encounters:  04/03/20 118/74  02/22/20 (!) 136/53  12/05/19 (!) 147/62   Pulse Readings from Last 3 Encounters:  04/03/20 (!) 56  02/22/20 82  12/05/19 65    Wt Readings from Last 3 Encounters:  04/03/20 202 lb 9.6 oz (91.9 kg)  02/22/20 205 lb 3.2 oz (93.1 kg)  12/05/19 201 lb 9.6 oz (91.4 kg)     Kidney Function Lab Results  Component Value Date/Time   CREATININE 1.07 02/22/2020 12:25 PM   CREATININE 1.10 09/20/2019 03:40 AM   CREATININE 0.97 11/04/2013  10:38 AM   CREATININE 1.02 05/04/2013 03:53 PM   GFR 68.39 02/22/2020 12:25 PM   GFRNONAA >60 09/20/2019 03:40 AM   GFRNONAA 80 11/04/2013 10:38 AM   GFRAA >60 09/20/2019 03:40 AM   GFRAA >89 11/04/2013 10:38 AM    BMP Latest Ref Rng & Units 02/22/2020 09/20/2019 09/16/2019  Glucose 70 - 99 mg/dL 409(W) 119(J) 95  BUN 6 - 23 mg/dL 16 14 13   Creatinine 0.40 - 1.50 mg/dL 4.78 2.95  BUN/Creat Ratio 10 - 24 - - -  Sodium 135 - 145 mEq/L 138 143 140  Potassium 3.5 - 5.1 mEq/L 5.1 4.0 3.9  Chloride 96 - 112 mEq/L 104 112(H) 107  CO2 19 - 32 mEq/L 29 23 24   Calcium 8.4 - 10.5 mg/dL 9.6 6.21) 9.0    . Current antihypertensive regimen:  o Lisinopril  5 mg take 1 tablet daily   . How often are you checking your Blood Pressure?   . Current home BP readings:   . What recent interventions/DTPs have been made by any provider to improve Blood Pressure control since last CPP Visit: None noted  . Any recent hospitalizations or ED visits since last visit with CPP? No   . What diet changes have been made to improve Blood Pressure Control?  o   . What exercise is being done to improve your Blood Pressure Control?  o   Adherence Review: Is the patient currently on ACE/ARB medication?  Yes Does the patient have >5 day gap between last estimated fill dates? No   Star Rating Drugs: Atorvastatin 20 mg last filled 05/31/20 90 DS Lisinopril 5 mg last filled 03/24/20 90 DS Left voicemail x 2, unable to reach patient  [facility] Clinical Pharmacist Assistant [phone]

## 2020-06-01 ENCOUNTER — Telehealth: Payer: PPO

## 2020-07-03 ENCOUNTER — Telehealth: Payer: PPO

## 2020-07-10 ENCOUNTER — Telehealth: Payer: Self-pay | Admitting: Pharmacist

## 2020-07-10 NOTE — Telephone Encounter (Signed)
-----   Message from Henrene Pastor, PharmD sent at 07/03/2020  3:33 PM EDT ----- Regarding: FW: PharmD fu needs rescheduling Please reach out to patient next week to reschedule.  Tammy ----- Message ----- From: Rayfield Citizen, NT Sent: 07/03/2020   7:49 AM EDT To: Henrene Pastor, PharmD Subject: PharmD fu needs rescheduling                   Charm Rings called and left a message on our scheduling line that due to unforseen circumstances he needs to reschedule today's appt with you. I have canceled the appt.  Thank you   Gwenevere Ghazi  Care Guide, Embedded Care Coordination [facility]  [place], [place] [address] Direct Dial: [phone] [name]: [email or web address]

## 2020-07-10 NOTE — Chronic Care Management (AMB) (Signed)
Tried calling patient to reschedule appointment with Tammy Eckard. No voicemail set up.

## 2020-07-30 IMAGING — CR DG CHEST 2V
2 series · 2 of 2 positions shown · non-contrast
Comparison: Chest CT 02/05/2018

CLINICAL DATA: Aortic stenosis, preop

EXAM:
CHEST - 2 VIEW

[w chest pa]
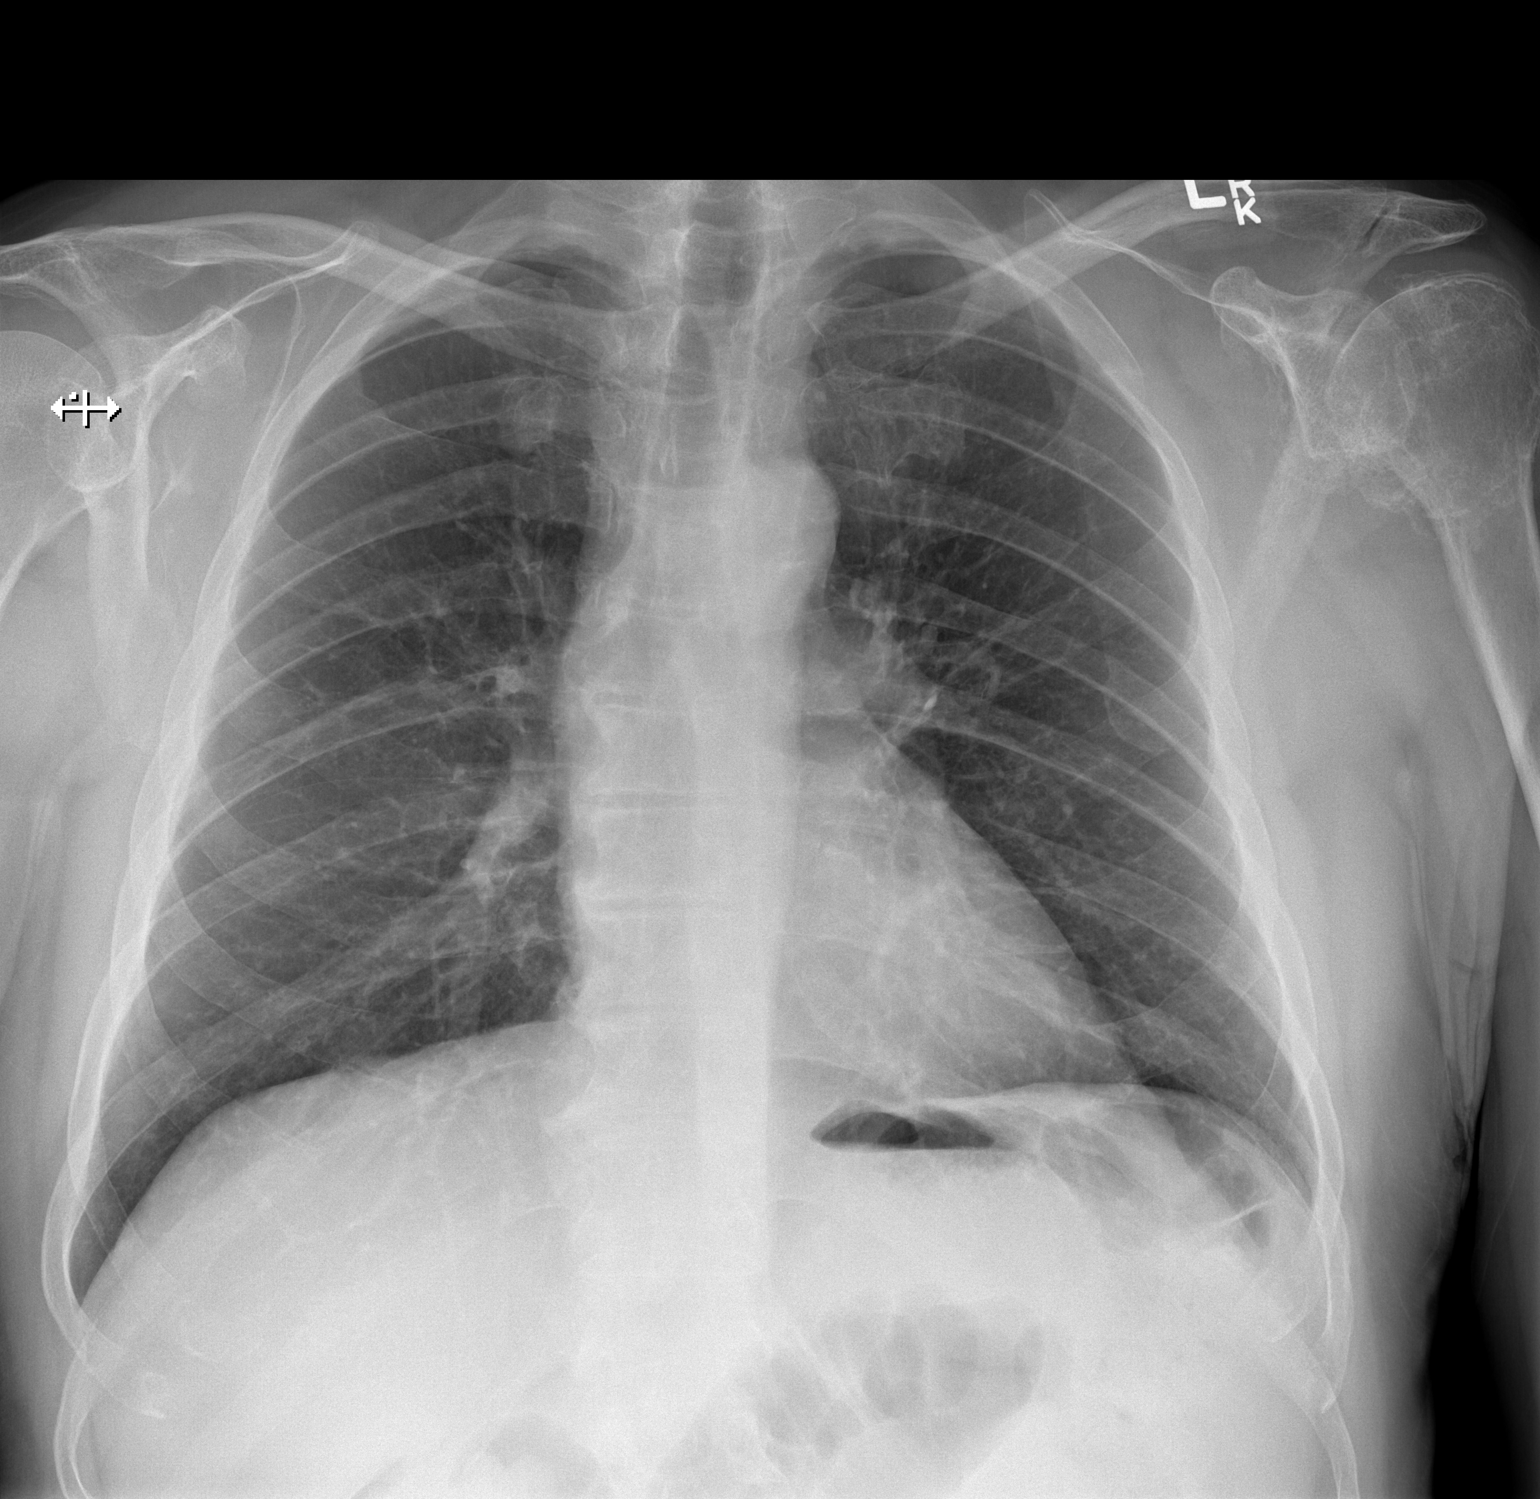

[w chest lat]
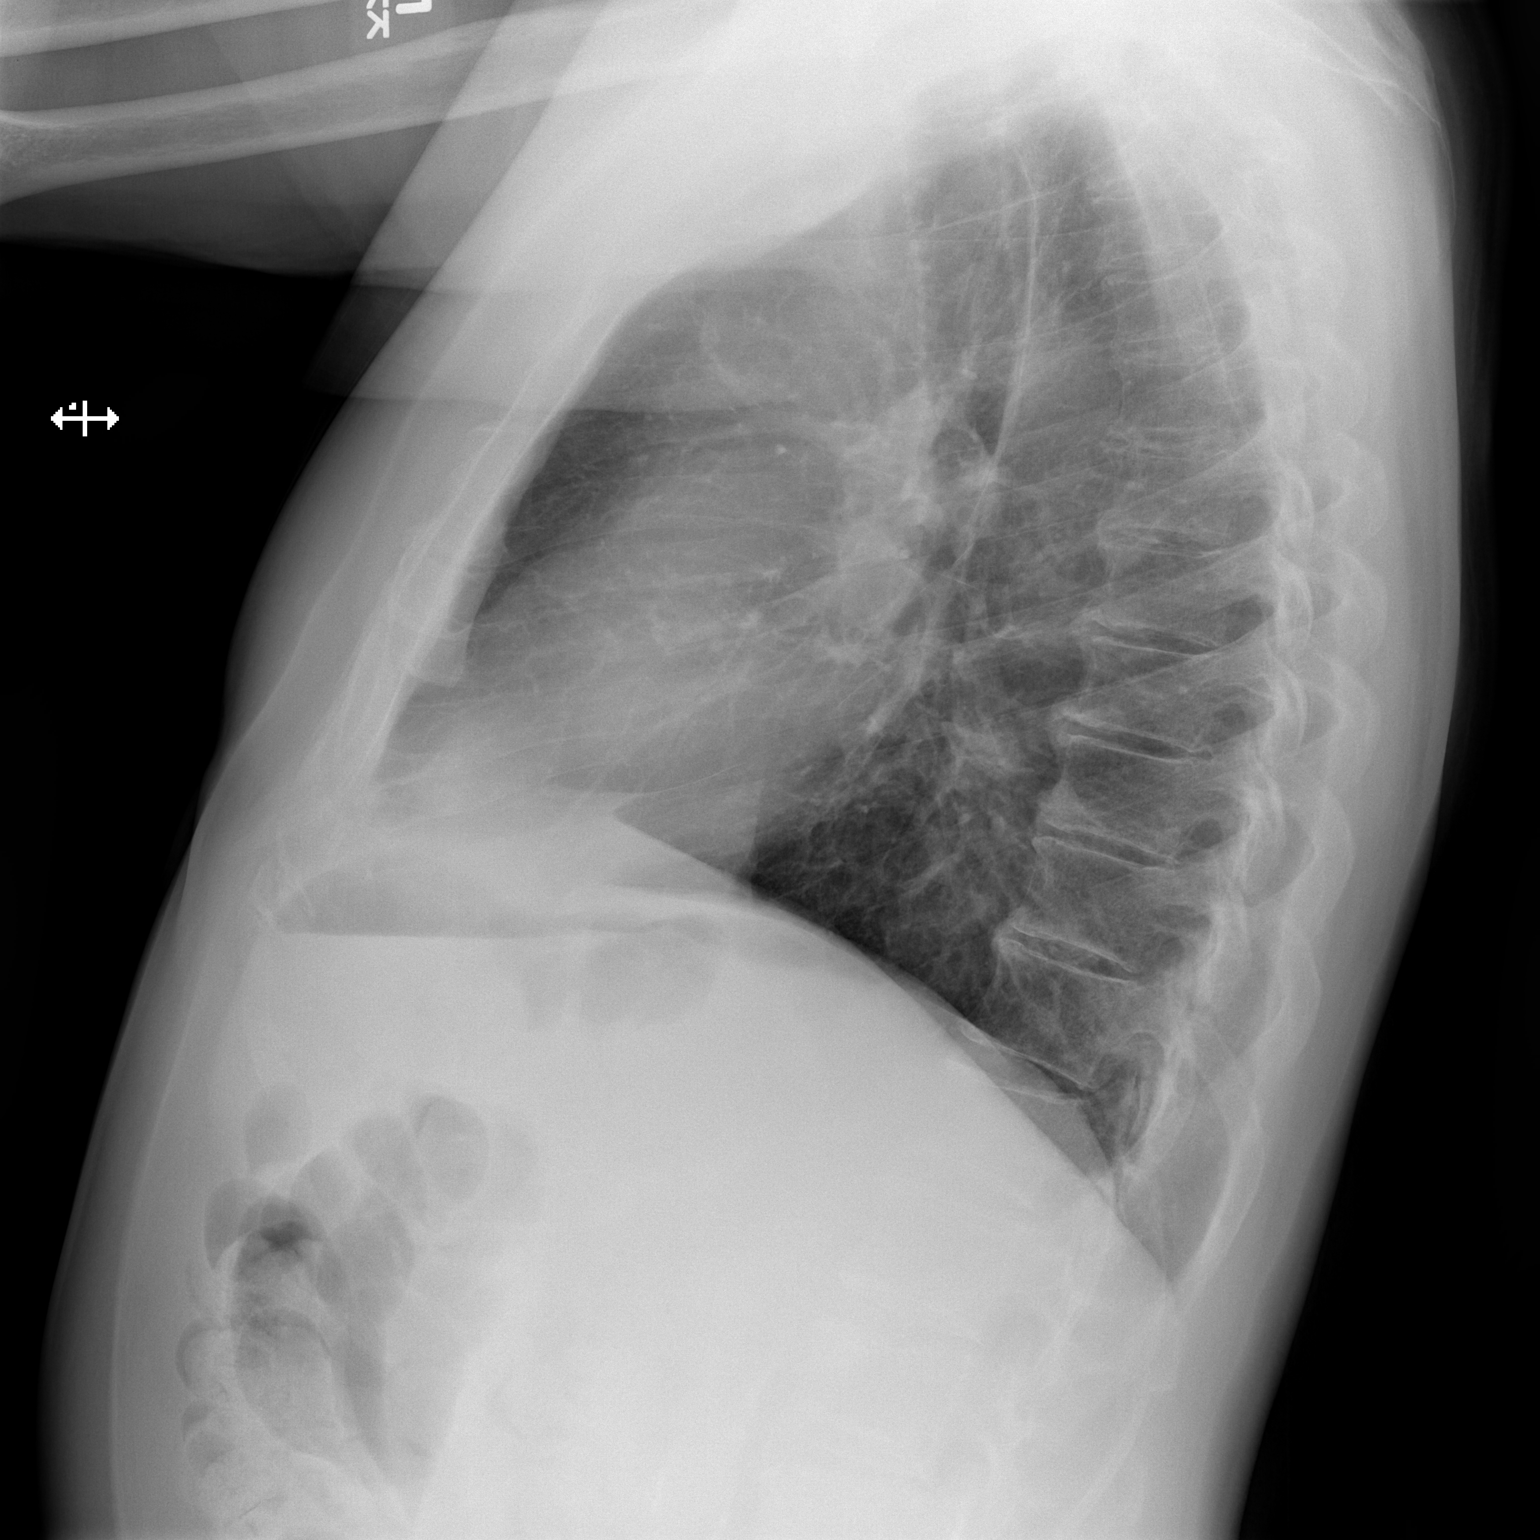

[2 of 2 positions shown; findings below may reference images not displayed]

FINDINGS: Normal heart size and mediastinal contours. No acute infiltrate or
edema. No effusion or pneumothorax. No acute osseous findings.
IMPRESSION: No evidence of active disease.

## 2020-07-31 IMAGING — DX DG CHEST 1V PORT
1 series · 1 of 1 positions shown · non-contrast
Comparison: Chest x-ray 02/22/2018.

CLINICAL DATA: [age] year old male status post TAVR.

EXAM:
PORTABLE CHEST 1 VIEW

[chest ap]
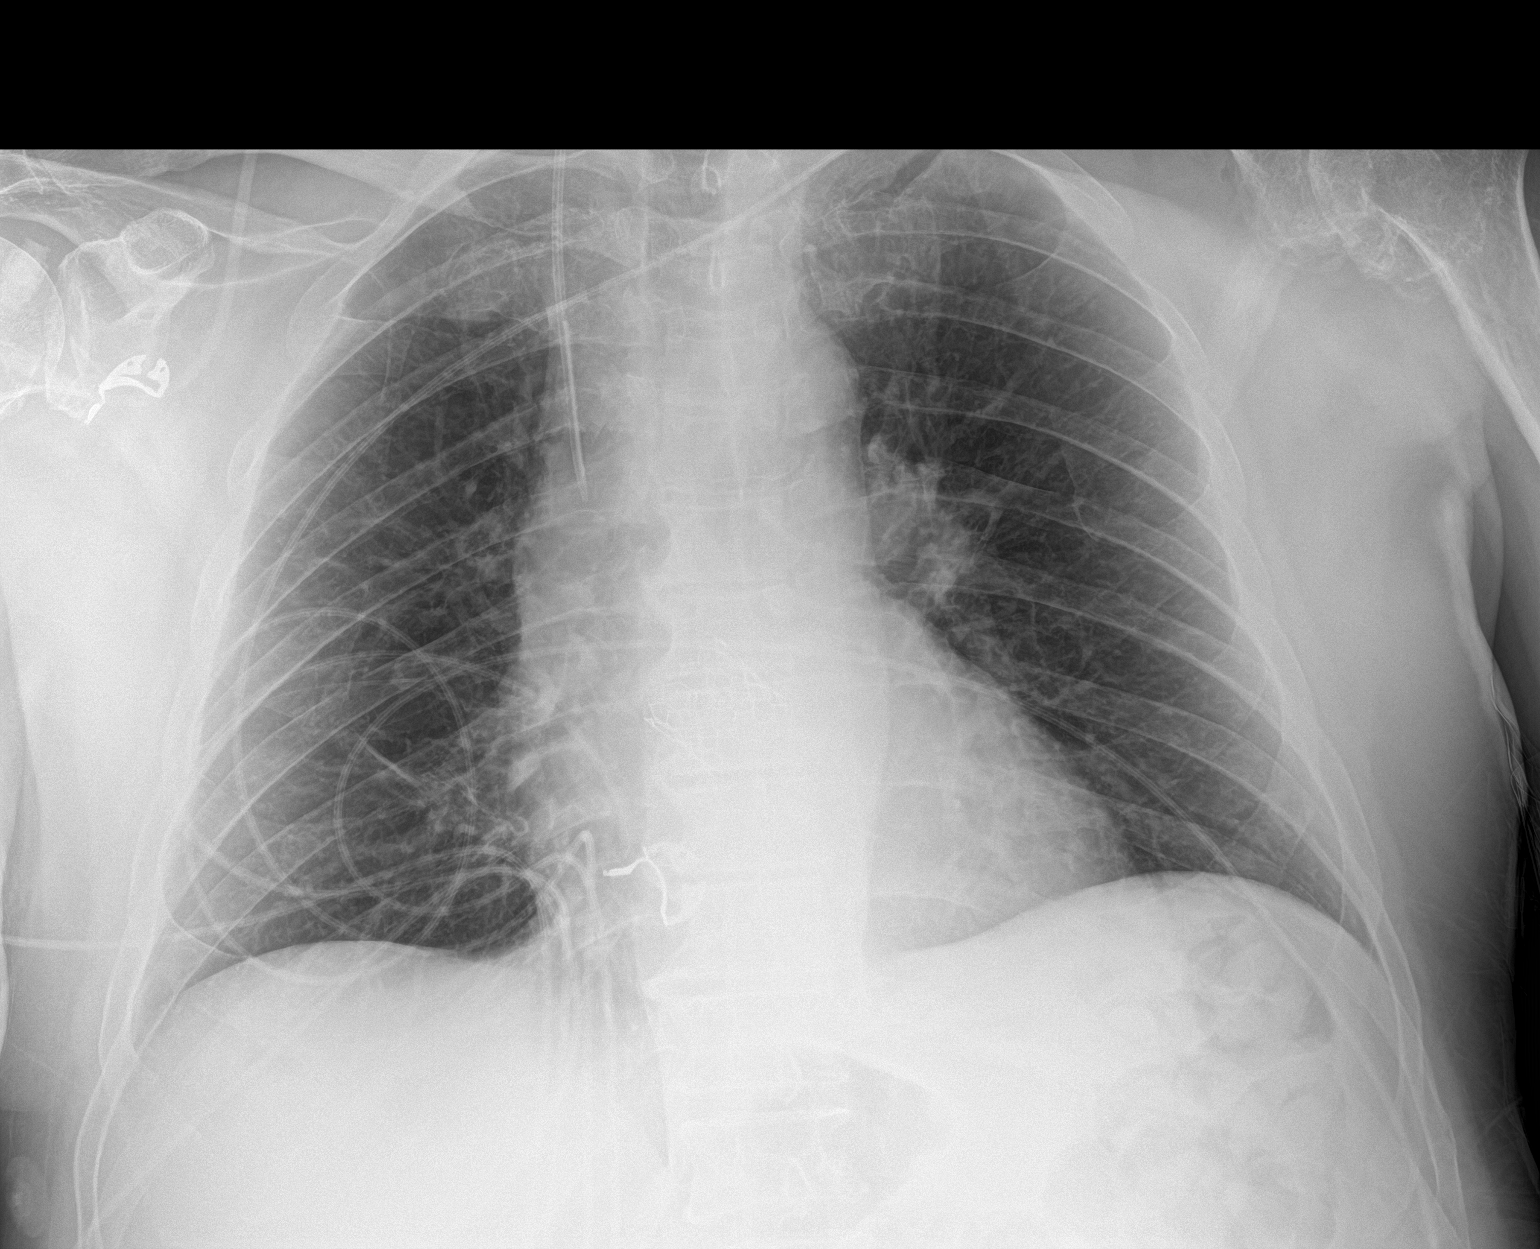

[1 of 1 positions shown; findings below may reference images not displayed]

FINDINGS: There is a right-sided internal jugular central venous catheter with
tip terminating in the mid superior vena cava. Transcatheter aortic
valve in position. Lung volumes are normal. No consolidative
airspace disease. No pleural effusions. No pneumothorax. No
pulmonary nodule or mass noted. Pulmonary vasculature and the
cardiomediastinal silhouette are within normal limits.
Atherosclerotic calcifications in the thoracic aorta.
IMPRESSION: 1. Support apparatus and postprocedural changes, as above.
2. No radiographic evidence of acute cardiopulmonary disease.
3. Aortic atherosclerosis.

## 2020-08-27 ENCOUNTER — Ambulatory Visit (HOSPITAL_BASED_OUTPATIENT_CLINIC_OR_DEPARTMENT_OTHER)
Admission: RE | Admit: 2020-08-27 | Discharge: 2020-08-27 | Disposition: A | Discharge status: Home / Self Care | Payer: PPO | Source: Ambulatory Visit | Attending: Medical | Admitting: Medical

## 2020-08-27 ENCOUNTER — Other Ambulatory Visit: Payer: Self-pay

## 2020-08-27 ENCOUNTER — Ambulatory Visit (INDEPENDENT_AMBULATORY_CARE_PROVIDER_SITE_OTHER): Payer: PPO | Admitting: Medical

## 2020-08-27 ENCOUNTER — Encounter: Payer: Self-pay | Admitting: Medical

## 2020-08-27 VITALS — BP 140/70 | HR 71 | Resp 18 | Ht 71.0 in | Wt 202.0 lb

## 2020-08-27 DIAGNOSIS — M542 Cervicalgia: Secondary | ICD-10-CM

## 2020-08-27 DIAGNOSIS — I10 Essential (primary) hypertension: Secondary | ICD-10-CM

## 2020-08-27 DIAGNOSIS — I6521 Occlusion and stenosis of right carotid artery: Secondary | ICD-10-CM

## 2020-08-27 DIAGNOSIS — E782 Mixed hyperlipidemia: Secondary | ICD-10-CM | POA: Diagnosis not present

## 2020-08-27 DIAGNOSIS — R739 Hyperglycemia, unspecified: Secondary | ICD-10-CM

## 2020-08-27 MED ORDER — CYCLOBENZAPRINE HCL 5 MG PO TABS
ORAL_TABLET | ORAL | 0 refills | Status: DC
Start: 2020-08-27 — End: 2021-01-21

## 2020-08-27 NOTE — Progress Notes (Signed)
Subjective:    Patient ID: Jerry Ferguson, male    DOB: 1945/04/18, 75 y.o.   MRN: [id number]  HPI  Pt in with rt side of neck has been hurting off and on for 2 month(no fall or trauma). Some pain that runs toward shoulder and outer upper arm. No pain that radiates to his rt hand.   Pt states recently tried alleve.He states did help some. He also notes flexeril did help pain.   Hx of htn. Pt is on lisinopril. No nsaids today.  Hx of mild elevated sugars in past.   High cholesterol and hx of carotid stenosis. He is on atorvastatin.  Pt states he knows should follow up with vascular surgeon.    Review of Systems  Constitutional:  Negative for chills, fatigue, fever and unexpected weight change.  HENT:  Negative for congestion and dental problem.   Respiratory:  Negative for cough, chest tightness, shortness of breath and wheezing.   Cardiovascular:  Negative for chest pain and palpitations.  Gastrointestinal:  Negative for abdominal pain.  Genitourinary:  Negative for dysuria, flank pain, frequency, scrotal swelling, testicular pain and urgency.  Musculoskeletal:  Positive for neck pain. Negative for back pain.  Skin:  Negative for rash.  Neurological:  Negative for dizziness, syncope, weakness, light-headedness and numbness.  Hematological:  Negative for adenopathy. Does not bruise/bleed easily.  Psychiatric/Behavioral:  Negative for behavioral problems, confusion and dysphoric mood. The patient is not nervous/anxious.     Past Medical History:  Diagnosis Date   Abnormal liver function test    Acute bronchitis    Carotid artery occlusion    Bilateral Bruit   Cataract    Coronary artery disease involving native coronary artery of native heart without angina pectoris    DJD (degenerative joint disease)    Dyspnea    GERD (gastroesophageal reflux disease)    Heart murmur    History of UTI    Hypercholesteremia    Hypertension    Lumbar back pain    Overweight(278.02)     Pulmonary nodules    Multiple small pulmonary nodules scattered throughout both lungs, 12 month CT rec if pt high risk   S/P TAVR (transcatheter aortic valve replacement)    26 mm Edwards Sapien 3 transcatheter heart valve placed via percutaneous right transfemoral approach    Severe aortic stenosis    Severe aortic stenosis      Social History   Socioeconomic History   Marital status: Widowed    Spouse name: stacey(deceased 04-May-2009)   Number of children: 1   Years of education: Not on file   Highest education level: Not on file  Occupational History   Occupation: welder  Tobacco Use   Smoking status: Former    Packs/day: 1.00    Years: 15.00    Pack years: 15.00    Types: Cigarettes    Quit date: 01/06/1989    Years since quitting: 31.6   Smokeless tobacco: Never   Tobacco comments:    chewed some in early May 05, 2022  Vaping Use   Vaping Use: Never used  Substance and Sexual Activity   Alcohol use: Not Currently   Drug use: No   Sexual activity: Not on file  Other Topics Concern   Not on file  Social History Narrative   Uncle with prostate cancer?   Social Determinants of Health   Financial Resource Strain: Not on file  Food Insecurity: Not on file  Transportation Needs: Not on file  Physical Activity: Not on file  Stress: Not on file  Social Connections: Not on file  Intimate Partner Violence: Not on file    Past Surgical History:  Procedure Laterality Date   APPENDECTOMY     CAROTID ENDARTERECTOMY Right 09/19/2019   COLONOSCOPY     CORONARY ANGIOGRAPHY N/A 02/04/2018   Procedure: CORONARY ANGIOGRAPHY;  Surgeon: Tonny Bollman, MD;  Location: [facility] INVASIVE CV LAB;  Service: Cardiovascular;  Laterality: N/A;   ENDARTERECTOMY Right 09/19/2019   Procedure: RIGHT ENDARTERECTOMY CAROTID;  Surgeon: Cephus Shelling, MD;  Location: [facility] OR;  Service: Vascular;  Laterality: Right;   PATCH ANGIOPLASTY Right 09/19/2019   Procedure: PATCH ANGIOPLASTY RIGHT CAROTID;  Surgeon:  Cephus Shelling, MD;  Location: [facility] OR;  Service: Vascular;  Laterality: Right;   RIGHT HEART CATH N/A 02/04/2018   Procedure: RIGHT HEART CATH;  Surgeon: Tonny Bollman, MD;  Location: [facility] INVASIVE CV LAB;  Service: Cardiovascular;  Laterality: N/A;   TEE WITHOUT CARDIOVERSION N/A 02/23/2018   Procedure: TRANSESOPHAGEAL ECHOCARDIOGRAM (TEE);  Surgeon: Tonny Bollman, MD;  Location: [facility] INVASIVE CV LAB;  Service: Open Heart Surgery;  Laterality: N/A;   TRANSCATHETER AORTIC VALVE REPLACEMENT, TRANSFEMORAL  02/23/2018   TRANSCATHETER AORTIC VALVE REPLACEMENT, TRANSFEMORAL N/A 02/23/2018   Procedure: TRANSCATHETER AORTIC VALVE REPLACEMENT, TRANSFEMORAL;  Surgeon: Tonny Bollman, MD;  Location: [facility] INVASIVE CV LAB;  Service: Open Heart Surgery;  Laterality: N/A;    Family History  Problem Relation Age of Onset   Colon cancer Father        metastatic   Other Father        DJD   Arthritis Father 5   Healthy Mother        Living-87   Prostate cancer Brother    Heart attack Paternal Grandfather    Heart disease Paternal Grandfather    Cancer Paternal Uncle    Colon cancer Sister    Hypertension Daughter        x1   Healthy Daughter        x1   Esophageal cancer Neg Hx    Stomach cancer Neg Hx    Rectal cancer Neg Hx     No Known Allergies  Current Outpatient Medications on File Prior to Visit  Medication Sig Dispense Refill   albuterol (VENTOLIN HFA) 108 (90 Base) MCG/ACT inhaler Inhale 2 puffs into the lungs every 6 (six) hours as needed. 18 g 0   aspirin EC 81 MG tablet Take 1 tablet (81 mg total) by mouth daily. Swallow whole. 90 tablet 3   atorvastatin (LIPITOR) 20 MG tablet Take 1 tablet (20 mg total) by mouth daily. 90 tablet 3   cyclobenzaprine (FLEXERIL) 5 MG tablet 1 tab po q hs as needed neck pain 10 tablet 0   fluticasone (FLONASE) 50 MCG/ACT nasal spray SPRAY 2 SPRAYS INTO EACH NOSTRIL EVERY DAY 48 mL 1   HYDROcodone-acetaminophen (NORCO/VICODIN) 5-325 MG tablet Take 1  tablet by mouth every 6 (six) hours as needed for moderate pain. 8 tablet 0   lisinopril (ZESTRIL) 5 MG tablet Take 1 tablet (5 mg total) by mouth daily. 90 tablet 3   amoxicillin (AMOXIL) 500 MG capsule Take 4 tablets (2000 mg) ONE HOUR prior to any dental procedure or cleaning 8 capsule 2   No current facility-administered medications on file prior to visit.    BP 140/70   Pulse 71   Resp 18   Ht 5\' 11"  (1.803 m)   Wt 202 lb (91.6 kg)  SpO2 100%   BMI 28.17 kg/m       Objective:   Physical Exam  General Mental Status- Alert. General Appearance- Not in acute distress.   Skin General: Color- Normal Color. Moisture- Normal Moisture.  Neck Rt side trapezius pain on palpation and range of motion. No mid c spine tendernss.  RT upper ext- normal range of motion. 5/5 grip strength.  Chest and Lung Exam Auscultation: Breath Sounds:-Normal.  Cardiovascular Auscultation:Rythm- Regular. Murmurs & Other Heart Sounds:Auscultation of the heart reveals- No Murmurs.  Abdomen Inspection:-Inspeection Normal. Palpation/Percussion:Note:No mass. Palpation and Percussion of the abdomen reveal- Non Tender, Non Distended + BS, no rebound or guarding.   Neurologic Cranial Nerve exam:- CN III-XII intact(No nystagmus), symmetric smile. Strength:- 5/5 equal and symmetric strength both upper and lower extremities.       Assessment & Plan:   For rt side neck pain will get cervical spine xray. This would be helpful since some pain radiates to rt shoulder and upper rt arm.  Rx flexeril muscle relaxant. Rx advisement. Can use tylenol salon pas lidocaine patch otc or thermacare patch.  May refer to sports medicine after xray review.  Bp borderline controlled. Check bp daily at home. Up date me on bp reading in one week. May increase lisinopril to 10 mg daily.  For elevated sugar and high cholesterol placing future cmp, lipid panel and a1c. Continue current statin.  Schedule/follow up  with vascular surgeon.  Follow up date to be determined after lab review.  Esperanza Richters, PA-C

## 2020-08-27 NOTE — Patient Instructions (Addendum)
For rt side neck pain will get cervical spine xray. This would be helpful since some pain radiates to rt shoulder and upper rt arm.  Rx flexeril muscle relaxant. Rx advisement. Can use tylenol salon pas lidocaine patch otc or thermacare patch.  May refer to sports medicine after xray review.  Bp borderline controlled. Check bp daily at home. Up date me on bp reading in one week. May increase lisinopril to 10 mg daily.  For elevated sugar and high cholesterol placing future cmp, lipid panel and a1c. Continue current statin.  Schedule/follow up with vascular surgeon.  Follow up date to be determined after lab review.

## 2020-08-28 ENCOUNTER — Other Ambulatory Visit (INDEPENDENT_AMBULATORY_CARE_PROVIDER_SITE_OTHER): Payer: PPO

## 2020-08-28 DIAGNOSIS — I10 Essential (primary) hypertension: Secondary | ICD-10-CM | POA: Diagnosis not present

## 2020-08-28 DIAGNOSIS — R739 Hyperglycemia, unspecified: Secondary | ICD-10-CM

## 2020-08-28 DIAGNOSIS — E782 Mixed hyperlipidemia: Secondary | ICD-10-CM | POA: Diagnosis not present

## 2020-08-28 LAB — COMPREHENSIVE METABOLIC PANEL
ALT: 17 U/L (ref 0–53)
AST: 18 U/L (ref 0–37)
Albumin: 4.2 g/dL (ref 3.5–5.2)
Alkaline Phosphatase: 56 U/L (ref 39–117)
BUN: 19 mg/dL (ref 6–23)
CO2: 30 mEq/L (ref 19–32)
Calcium: 9.5 mg/dL (ref 8.4–10.5)
Chloride: 103 mEq/L (ref 96–112)
Creatinine, Ser: 1.04 mg/dL (ref 0.40–1.50)
GFR: 70.51 mL/min (ref >60.00)
Glucose, Bld: 97 mg/dL (ref 70–99)
Potassium: 4.7 mEq/L (ref 3.5–5.1)
Sodium: 139 mEq/L (ref 135–145)
Total Bilirubin: 1.4 mg/dL — ABNORMAL HIGH (ref 0.2–1.2)
Total Protein: 6.4 g/dL (ref 6.0–8.3)

## 2020-08-28 LAB — LIPID PANEL
Cholesterol: 124 mg/dL (ref 0–200)
HDL: 44.1 mg/dL (ref >39.00)
LDL Cholesterol: 63 mg/dL (ref 0–99)
NonHDL: 79.52
Total CHOL/HDL Ratio: 3
Triglycerides: 84 mg/dL (ref 0.0–149.0)
VLDL: 16.8 mg/dL (ref 0.0–40.0)

## 2020-08-28 LAB — HEMOGLOBIN A1C: Hgb A1c MFr Bld: 5.6 % (ref 4.6–6.5)

## 2020-08-28 NOTE — Addendum Note (Signed)
Addended by: Gwenevere Abbot on: 08/28/2020 05:52 PM   Modules accepted: Orders

## 2020-09-20 ENCOUNTER — Ambulatory Visit: Payer: PPO | Admitting: Family Medicine

## 2020-09-20 ENCOUNTER — Other Ambulatory Visit: Payer: Self-pay

## 2020-09-20 ENCOUNTER — Encounter: Payer: Self-pay | Admitting: Family Medicine

## 2020-09-20 VITALS — Ht 71.0 in | Wt 202.0 lb

## 2020-09-20 DIAGNOSIS — M5412 Radiculopathy, cervical region: Secondary | ICD-10-CM | POA: Insufficient documentation

## 2020-09-20 MED ORDER — PREDNISONE 5 MG PO TABS: ORAL_TABLET | ORAL | 0 refills | Status: DC

## 2020-09-20 MED ORDER — DICLOFENAC SODIUM 1 % EX GEL: 2.0000 g | Freq: Four times a day (QID) | CUTANEOUS | 11 refills | Status: DC

## 2020-09-20 NOTE — Patient Instructions (Signed)
Nice to meet you Please try heat  Please try the prednisone  Please try the exercises   Please send me a message in MyChart with any questions or updates.  Please see me back in 3 weeks.   --Dr. Jordan Likes

## 2020-09-20 NOTE — Assessment & Plan Note (Signed)
Symptoms seem more consistent with radicular type pain.  Does have degenerative changes observed on imaging. -Counseled on home exercise therapy and supportive care. -Prednisone. -Voltaren. -Could consider gabapentin or physical therapy.

## 2020-09-20 NOTE — Progress Notes (Signed)
Jerry Ferguson - 75 y.o. male MRN [id number]  Date of birth: May 02, 1945  SUBJECTIVE:  Including CC & ROS.  No chief complaint on file.   Jerry Ferguson is a 75 y.o. male that is presenting with right-sided neck pain and periscapular pain.  He is having some radiation down the arm.  Seems to be intermittent in nature with no specific injury or inciting event.  Does get improvement with the muscle relaxers.  Independent review of the cervical spine x-ray 8/22 shows anterior osteophyte at C6.   Review of Systems See HPI   HISTORY: Past Medical, Surgical, Social, and Family History Reviewed & Updated per EMR.   Pertinent Historical Findings include:  Past Medical History:  Diagnosis Date   Abnormal liver function test    Acute bronchitis    Carotid artery occlusion    Bilateral Bruit   Cataract    Coronary artery disease involving native coronary artery of native heart without angina pectoris    DJD (degenerative joint disease)    Dyspnea    GERD (gastroesophageal reflux disease)    Heart murmur    History of UTI    Hypercholesteremia    Hypertension    Lumbar back pain    Overweight(278.02)    Pulmonary nodules    Multiple small pulmonary nodules scattered throughout both lungs, 12 month CT rec if pt high risk   S/P TAVR (transcatheter aortic valve replacement)    26 mm Edwards Sapien 3 transcatheter heart valve placed via percutaneous right transfemoral approach    Severe aortic stenosis    Severe aortic stenosis     Past Surgical History:  Procedure Laterality Date   APPENDECTOMY     CAROTID ENDARTERECTOMY Right 09/19/2019   COLONOSCOPY     CORONARY ANGIOGRAPHY N/A 02/04/2018   Procedure: CORONARY ANGIOGRAPHY;  Surgeon: Tonny Bollman, MD;  Location: [facility] INVASIVE CV LAB;  Service: Cardiovascular;  Laterality: N/A;   ENDARTERECTOMY Right 09/19/2019   Procedure: RIGHT ENDARTERECTOMY CAROTID;  Surgeon: Cephus Shelling, MD;  Location: [facility] OR;  Service: Vascular;   Laterality: Right;   PATCH ANGIOPLASTY Right 09/19/2019   Procedure: PATCH ANGIOPLASTY RIGHT CAROTID;  Surgeon: Cephus Shelling, MD;  Location: [facility] OR;  Service: Vascular;  Laterality: Right;   RIGHT HEART CATH N/A 02/04/2018   Procedure: RIGHT HEART CATH;  Surgeon: Tonny Bollman, MD;  Location: [facility] INVASIVE CV LAB;  Service: Cardiovascular;  Laterality: N/A;   TEE WITHOUT CARDIOVERSION N/A 02/23/2018   Procedure: TRANSESOPHAGEAL ECHOCARDIOGRAM (TEE);  Surgeon: Tonny Bollman, MD;  Location: [facility] INVASIVE CV LAB;  Service: Open Heart Surgery;  Laterality: N/A;   TRANSCATHETER AORTIC VALVE REPLACEMENT, TRANSFEMORAL  02/23/2018   TRANSCATHETER AORTIC VALVE REPLACEMENT, TRANSFEMORAL N/A 02/23/2018   Procedure: TRANSCATHETER AORTIC VALVE REPLACEMENT, TRANSFEMORAL;  Surgeon: Tonny Bollman, MD;  Location: [facility] INVASIVE CV LAB;  Service: Open Heart Surgery;  Laterality: N/A;    Family History  Problem Relation Age of Onset   Colon cancer Father        metastatic   Other Father        DJD   Arthritis Father 33   Healthy Mother        Living-87   Prostate cancer Brother    Heart attack Paternal Grandfather    Heart disease Paternal Grandfather    Cancer Paternal Uncle    Colon cancer Sister    Hypertension Daughter        x1   Healthy Daughter  x1   Esophageal cancer Neg Hx    Stomach cancer Neg Hx    Rectal cancer Neg Hx     Social History   Socioeconomic History   Marital status: Widowed    Spouse name: stacey(deceased May 20, 2009)   Number of children: 1   Years of education: Not on file   Highest education level: Not on file  Occupational History   Occupation: welder  Tobacco Use   Smoking status: Former    Packs/day: 1.00    Years: 15.00    Pack years: 15.00    Types: Cigarettes    Quit date: 01/06/1989    Years since quitting: 31.7   Smokeless tobacco: Never   Tobacco comments:    chewed some in early 05/21/2022  Vaping Use   Vaping Use: Never used  Substance and Sexual  Activity   Alcohol use: Not Currently   Drug use: No   Sexual activity: Not on file  Other Topics Concern   Not on file  Social History Narrative   Uncle with prostate cancer?   Social Determinants of Health   Financial Resource Strain: Not on file  Food Insecurity: Not on file  Transportation Needs: Not on file  Physical Activity: Not on file  Stress: Not on file  Social Connections: Not on file  Intimate Partner Violence: Not on file     PHYSICAL EXAM:  VS: Ht 5\' 11"  (1.803 m)   Wt 202 lb (91.6 kg)   BMI 28.17 kg/m  Physical Exam Gen: NAD, alert, cooperative with exam, well-appearing      ASSESSMENT & PLAN:   Cervical radiculopathy Symptoms seem more consistent with radicular type pain.  Does have degenerative changes observed on imaging. -Counseled on home exercise therapy and supportive care. -Prednisone. -Voltaren. -Could consider gabapentin or physical therapy.

## 2020-10-04 ENCOUNTER — Other Ambulatory Visit: Payer: Self-pay

## 2020-10-04 DIAGNOSIS — I6523 Occlusion and stenosis of bilateral carotid arteries: Secondary | ICD-10-CM

## 2020-10-11 ENCOUNTER — Ambulatory Visit: Payer: PPO | Admitting: Family Medicine

## 2020-10-16 ENCOUNTER — Ambulatory Visit (HOSPITAL_COMMUNITY)
Admission: RE | Admit: 2020-10-16 | Discharge: 2020-10-16 | Disposition: A | Discharge status: Home / Self Care | Payer: PPO | Source: Ambulatory Visit | Attending: Vascular Surgery | Admitting: Vascular Surgery

## 2020-10-16 ENCOUNTER — Other Ambulatory Visit: Payer: Self-pay

## 2020-10-16 ENCOUNTER — Encounter: Payer: Self-pay | Admitting: Vascular Surgery

## 2020-10-16 ENCOUNTER — Ambulatory Visit (INDEPENDENT_AMBULATORY_CARE_PROVIDER_SITE_OTHER): Payer: PPO | Admitting: Vascular Surgery

## 2020-10-16 VITALS — BP 123/73 | HR 69 | Temp 97.6°F | Resp 18 | Ht 71.0 in | Wt 192.0 lb

## 2020-10-16 DIAGNOSIS — I6521 Occlusion and stenosis of right carotid artery: Secondary | ICD-10-CM | POA: Diagnosis not present

## 2020-10-16 DIAGNOSIS — I6523 Occlusion and stenosis of bilateral carotid arteries: Secondary | ICD-10-CM | POA: Insufficient documentation

## 2020-10-16 NOTE — Progress Notes (Signed)
Patient name: Jerry Ferguson MRN: [id number] DOB: May 10, 1945 Sex: male  REASON FOR VISIT: 54-month follow-up for surveillance of carotid artery disease  HPI: Jerry Ferguson is a 75 y.o. male with multiple medical problems as listed below that presents for 45-month follow-up for surveillance of his carotid artery disease.  He had a right carotid endarterectomy on 09/19/2019 for an asymptomatic high-grade stenosis.  He reports no issues on follow-up today.  He's had no neurologic events over the last 9 months.  Remains on aspirin and statin.  Past Medical History:  Diagnosis Date   Abnormal liver function test    Acute bronchitis    Carotid artery occlusion    Bilateral Bruit   Cataract    Coronary artery disease involving native coronary artery of native heart without angina pectoris    DJD (degenerative joint disease)    Dyspnea    GERD (gastroesophageal reflux disease)    Heart murmur    History of UTI    Hypercholesteremia    Hypertension    Lumbar back pain    Overweight(278.02)    Pulmonary nodules    Multiple small pulmonary nodules scattered throughout both lungs, 12 month CT rec if pt high risk   S/P TAVR (transcatheter aortic valve replacement)    26 mm Edwards Sapien 3 transcatheter heart valve placed via percutaneous right transfemoral approach    Severe aortic stenosis    Severe aortic stenosis     Past Surgical History:  Procedure Laterality Date   APPENDECTOMY     CAROTID ENDARTERECTOMY Right 09/19/2019   COLONOSCOPY     CORONARY ANGIOGRAPHY N/A 02/04/2018   Procedure: CORONARY ANGIOGRAPHY;  Surgeon: Tonny Bollman, MD;  Location: [facility] INVASIVE CV LAB;  Service: Cardiovascular;  Laterality: N/A;   ENDARTERECTOMY Right 09/19/2019   Procedure: RIGHT ENDARTERECTOMY CAROTID;  Surgeon: Cephus Shelling, MD;  Location: [facility] OR;  Service: Vascular;  Laterality: Right;   PATCH ANGIOPLASTY Right 09/19/2019   Procedure: PATCH ANGIOPLASTY RIGHT CAROTID;  Surgeon: Cephus Shelling, MD;  Location: [facility] OR;  Service: Vascular;  Laterality: Right;   RIGHT HEART CATH N/A 02/04/2018   Procedure: RIGHT HEART CATH;  Surgeon: Tonny Bollman, MD;  Location: [facility] INVASIVE CV LAB;  Service: Cardiovascular;  Laterality: N/A;   TEE WITHOUT CARDIOVERSION N/A 02/23/2018   Procedure: TRANSESOPHAGEAL ECHOCARDIOGRAM (TEE);  Surgeon: Tonny Bollman, MD;  Location: [facility] INVASIVE CV LAB;  Service: Open Heart Surgery;  Laterality: N/A;   TRANSCATHETER AORTIC VALVE REPLACEMENT, TRANSFEMORAL  02/23/2018   TRANSCATHETER AORTIC VALVE REPLACEMENT, TRANSFEMORAL N/A 02/23/2018   Procedure: TRANSCATHETER AORTIC VALVE REPLACEMENT, TRANSFEMORAL;  Surgeon: Tonny Bollman, MD;  Location: [facility] INVASIVE CV LAB;  Service: Open Heart Surgery;  Laterality: N/A;    Family History  Problem Relation Age of Onset   Colon cancer Father        metastatic   Other Father        DJD   Arthritis Father 67   Healthy Mother        Living-87   Prostate cancer Brother    Heart attack Paternal Grandfather    Heart disease Paternal Grandfather    Cancer Paternal Uncle    Colon cancer Sister    Hypertension Daughter        x1   Healthy Daughter        x1   Esophageal cancer Neg Hx    Stomach cancer Neg Hx    Rectal cancer Neg Hx  SOCIAL HISTORY: Social History   Tobacco Use   Smoking status: Former    Packs/day: 1.00    Years: 15.00    Pack years: 15.00    Types: Cigarettes    Quit date: 01/06/1989    Years since quitting: 31.7   Smokeless tobacco: Never   Tobacco comments:    chewed some in early 20's  Substance Use Topics   Alcohol use: Not Currently    No Known Allergies  Current Outpatient Medications  Medication Sig Dispense Refill   albuterol (VENTOLIN HFA) 108 (90 Base) MCG/ACT inhaler Inhale 2 puffs into the lungs every 6 (six) hours as needed. 18 g 0   aspirin EC 81 MG tablet Take 1 tablet (81 mg total) by mouth daily. Swallow whole. 90 tablet 3   atorvastatin (LIPITOR) 20 MG  tablet Take 1 tablet (20 mg total) by mouth daily. 90 tablet 3   cyclobenzaprine (FLEXERIL) 5 MG tablet 1 tab po q hs as needed neck pain 20 tablet 0   diclofenac Sodium (VOLTAREN) 1 % GEL Apply 2 g topically 4 (four) times daily. To affected joint. 100 g 11   fluticasone (FLONASE) 50 MCG/ACT nasal spray SPRAY 2 SPRAYS INTO EACH NOSTRIL EVERY DAY 48 mL 1   lisinopril (ZESTRIL) 5 MG tablet Take 1 tablet (5 mg total) by mouth daily. 90 tablet 3   amoxicillin (AMOXIL) 500 MG capsule Take 4 tablets (2000 mg) ONE HOUR prior to any dental procedure or cleaning (Patient not taking: Reported on 10/16/2020) 8 capsule 2   HYDROcodone-acetaminophen (NORCO/VICODIN) 5-325 MG tablet Take 1 tablet by mouth every 6 (six) hours as needed for moderate pain. (Patient not taking: Reported on 10/16/2020) 8 tablet 0   predniSONE (DELTASONE) 5 MG tablet Take 6 pills for first day, 5 pills second day, 4 pills third day, 3 pills fourth day, 2 pills the fifth day, and 1 pill sixth day. (Patient not taking: Reported on 10/16/2020) 21 tablet 0   No current facility-administered medications for this visit.    REVIEW OF SYSTEMS:  [X]  denotes positive finding, [ ]  denotes negative finding Cardiac  Comments:  Chest pain or chest pressure:    Shortness of breath upon exertion:    Short of breath when lying flat:    Irregular heart rhythm:        Vascular    Pain in calf, thigh, or hip brought on by ambulation:    Pain in feet at night that wakes you up from your sleep:     Blood clot in your veins:    Leg swelling:         Pulmonary    Oxygen at home:    Productive cough:     Wheezing:         Neurologic    Sudden weakness in arms or legs:     Sudden numbness in arms or legs:     Sudden onset of difficulty speaking or slurred speech:    Temporary loss of vision in one eye:     Problems with dizziness:         Gastrointestinal    Blood in stool:     Vomited blood:         Genitourinary    Burning when  urinating:     Blood in urine:        Psychiatric    Major depression:         Hematologic    Bleeding problems:  Problems with blood clotting too easily:        Skin    Rashes or ulcers:        Constitutional    Fever or chills:      PHYSICAL EXAM: Vitals:   10/16/20 1327 10/16/20 1330  BP: (!) 145/74 123/73  Pulse: 69 69  Resp: 18   Temp: 97.6 F (36.4 C) 97.6 F (36.4 C)  TempSrc: Temporal Temporal  SpO2: 98%   Weight: 192 lb (87.1 kg)   Height: 5\' 11"  (1.803 m)     GENERAL: The patient is a well-nourished male, in no acute distress. The vital signs are documented above. CARDIAC: There is a regular rate and rhythm.  Right neck incision well-healed PULMONARY: No respiratory distress MUSCULOSKELETAL: There are no major deformities or cyanosis. NEUROLOGIC: No focal weakness or paresthesias are detected.  Cranial nerves II through XII grossly intact SKIN: There are no ulcers or rashes noted. PSYCHIATRIC: The patient has a normal affect.  DATA:   Carotid duplex today shows a patent right carotid endarterectomy site and minimal 1 to 39% stenosis on the left  Assessment/Plan:  75 year old male presents for 66-month follow-up for surveillance of his carotid artery disease.  He underwent right carotid endarterectomy on 09/19/2019 for an asymptomatic high-grade stenosis.  Carotid duplex today shows his endarterectomy site is patent with some disease in the proximal patch that does not appear significant or flow-limiting at this time.  Otherwise he has minimal disease on the contralateral side.  Overall he is doing well and remains asymptomatic.  We will see him in 1 year with carotid duplex.  Discussed he call with questions or concerns.  He will remain on aspirin statin for risk reduction.   09/21/2019, MD [facility] Office: [phone]   [phone]

## 2021-01-16 ENCOUNTER — Encounter (HOSPITAL_COMMUNITY): Payer: Self-pay

## 2021-01-16 ENCOUNTER — Emergency Department (HOSPITAL_COMMUNITY): Payer: PPO

## 2021-01-16 ENCOUNTER — Other Ambulatory Visit: Payer: Self-pay

## 2021-01-16 ENCOUNTER — Inpatient Hospital Stay (HOSPITAL_COMMUNITY)
Admission: EM | Admit: 2021-01-16 | Discharge: 2021-01-19 | DRG: 389 | Disposition: A | Discharge status: Home / Self Care | Payer: PPO | Attending: Family Medicine | Admitting: Family Medicine

## 2021-01-16 DIAGNOSIS — K56609 Unspecified intestinal obstruction, unspecified as to partial versus complete obstruction: Secondary | ICD-10-CM | POA: Diagnosis not present

## 2021-01-16 DIAGNOSIS — Z7982 Long term (current) use of aspirin: Secondary | ICD-10-CM | POA: Diagnosis not present

## 2021-01-16 DIAGNOSIS — R1111 Vomiting without nausea: Secondary | ICD-10-CM | POA: Diagnosis not present

## 2021-01-16 DIAGNOSIS — E7800 Pure hypercholesterolemia, unspecified: Secondary | ICD-10-CM | POA: Diagnosis present

## 2021-01-16 DIAGNOSIS — R188 Other ascites: Secondary | ICD-10-CM | POA: Diagnosis not present

## 2021-01-16 DIAGNOSIS — I2510 Atherosclerotic heart disease of native coronary artery without angina pectoris: Secondary | ICD-10-CM | POA: Diagnosis not present

## 2021-01-16 DIAGNOSIS — Z4682 Encounter for fitting and adjustment of non-vascular catheter: Secondary | ICD-10-CM | POA: Diagnosis not present

## 2021-01-16 DIAGNOSIS — Z87891 Personal history of nicotine dependence: Secondary | ICD-10-CM

## 2021-01-16 DIAGNOSIS — K565 Intestinal adhesions [bands], unspecified as to partial versus complete obstruction: Principal | ICD-10-CM | POA: Diagnosis present

## 2021-01-16 DIAGNOSIS — Z9049 Acquired absence of other specified parts of digestive tract: Secondary | ICD-10-CM

## 2021-01-16 DIAGNOSIS — Z8249 Family history of ischemic heart disease and other diseases of the circulatory system: Secondary | ICD-10-CM

## 2021-01-16 DIAGNOSIS — Z79899 Other long term (current) drug therapy: Secondary | ICD-10-CM

## 2021-01-16 DIAGNOSIS — K449 Diaphragmatic hernia without obstruction or gangrene: Secondary | ICD-10-CM | POA: Diagnosis present

## 2021-01-16 DIAGNOSIS — Z952 Presence of prosthetic heart valve: Secondary | ICD-10-CM

## 2021-01-16 DIAGNOSIS — I779 Disorder of arteries and arterioles, unspecified: Secondary | ICD-10-CM | POA: Diagnosis not present

## 2021-01-16 DIAGNOSIS — D72829 Elevated white blood cell count, unspecified: Secondary | ICD-10-CM

## 2021-01-16 DIAGNOSIS — R011 Cardiac murmur, unspecified: Secondary | ICD-10-CM | POA: Diagnosis present

## 2021-01-16 DIAGNOSIS — Z87440 Personal history of urinary (tract) infections: Secondary | ICD-10-CM | POA: Diagnosis not present

## 2021-01-16 DIAGNOSIS — K5669 Other partial intestinal obstruction: Secondary | ICD-10-CM | POA: Diagnosis not present

## 2021-01-16 DIAGNOSIS — R1110 Vomiting, unspecified: Secondary | ICD-10-CM | POA: Diagnosis not present

## 2021-01-16 DIAGNOSIS — R109 Unspecified abdominal pain: Secondary | ICD-10-CM | POA: Diagnosis not present

## 2021-01-16 DIAGNOSIS — I10 Essential (primary) hypertension: Secondary | ICD-10-CM | POA: Diagnosis not present

## 2021-01-16 DIAGNOSIS — Z20822 Contact with and (suspected) exposure to covid-19: Secondary | ICD-10-CM | POA: Diagnosis not present

## 2021-01-16 DIAGNOSIS — R1084 Generalized abdominal pain: Secondary | ICD-10-CM | POA: Diagnosis not present

## 2021-01-16 DIAGNOSIS — K219 Gastro-esophageal reflux disease without esophagitis: Secondary | ICD-10-CM | POA: Diagnosis present

## 2021-01-16 DIAGNOSIS — R110 Nausea: Secondary | ICD-10-CM | POA: Diagnosis not present

## 2021-01-16 DIAGNOSIS — R1032 Left lower quadrant pain: Secondary | ICD-10-CM | POA: Diagnosis present

## 2021-01-16 DIAGNOSIS — I878 Other specified disorders of veins: Secondary | ICD-10-CM | POA: Diagnosis not present

## 2021-01-16 LAB — COMPREHENSIVE METABOLIC PANEL
ALT: 21 U/L (ref 0–44)
AST: 23 U/L (ref 15–41)
Albumin: 4.7 g/dL (ref 3.5–5.0)
Alkaline Phosphatase: 66 U/L (ref 38–126)
Anion gap: 10 (ref 5–15)
BUN: 14 mg/dL (ref 8–23)
CO2: 24 mmol/L (ref 22–32)
Calcium: 9.9 mg/dL (ref 8.9–10.3)
Chloride: 104 mmol/L (ref 98–111)
Creatinine, Ser: 1.14 mg/dL (ref 0.61–1.24)
GFR, Estimated: >60 mL/min (ref >60)
Glucose, Bld: 140 mg/dL — ABNORMAL HIGH (ref 70–99)
Potassium: 4.2 mmol/L (ref 3.5–5.1)
Sodium: 138 mmol/L (ref 135–145)
Total Bilirubin: 2.3 mg/dL — ABNORMAL HIGH (ref 0.3–1.2)
Total Protein: 7.8 g/dL (ref 6.5–8.1)

## 2021-01-16 LAB — CBC
HCT: 46.2 % (ref 39.0–52.0)
Hemoglobin: 15.4 g/dL (ref 13.0–17.0)
MCH: 29.3 pg (ref 26.0–34.0)
MCHC: 33.3 g/dL (ref 30.0–36.0)
MCV: 87.8 fL (ref 80.0–100.0)
Platelets: 258 10*3/uL (ref 150–400)
RBC: 5.26 MIL/uL (ref 4.22–5.81)
RDW: 13.4 % (ref 11.5–15.5)
WBC: 14 10*3/uL — ABNORMAL HIGH (ref 4.0–10.5)
nRBC: 0 % (ref 0.0–0.2)

## 2021-01-16 LAB — LIPASE, BLOOD: Lipase: 54 U/L — ABNORMAL HIGH (ref 11–51)

## 2021-01-16 LAB — LACTIC ACID, PLASMA: Lactic Acid, Venous: 1.7 mmol/L (ref 0.5–1.9)

## 2021-01-16 MED ORDER — ONDANSETRON HCL 4 MG/2ML IJ SOLN: 4.0000 mg | Freq: Once | INTRAMUSCULAR | Status: DC | PRN

## 2021-01-16 MED ORDER — IOHEXOL 350 MG/ML SOLN
80.0000 mL | Freq: Once | INTRAVENOUS | Status: AC | PRN
  Administered 2021-01-16: 80 mL via INTRAVENOUS

## 2021-01-16 NOTE — ED Provider Triage Note (Signed)
Emergency Medicine Provider Triage Evaluation Note  Jerry Ferguson , a 76 y.o. male  was evaluated in triage.  Pt complains of abd pain since last night has worsened since. No CP, fever, sob.   Has been nauseated and vomited x3 this evening.   Appendectomy no other abd surg hx.   Review of Systems  Positive: Nausea, vomiting abd pain Negative: Fever  Physical Exam  BP (!) 101/56 (BP Location: Left Arm)    Pulse 95    Temp 98.7 F (37.1 C) (Oral)    Resp 16    Ht 5\' 11"  (1.803 m)    Wt 94.3 kg    SpO2 99%    BMI 29.01 kg/m  Gen:   Awake, no distress   Resp:  Normal effort  MSK:   Moves extremities without difficulty  Other:  TTP of lower abd  Medical Decision Making  Medically screening exam initiated at 10:33 PM.  Appropriate orders placed.  Jerry Ferguson was informed that the remainder of the evaluation will be completed by another provider, this initial triage assessment does not replace that evaluation, and the importance of remaining in the ED until their evaluation is complete.  CT and labs. +Lact   Jerry Ferguson, [place] 01/16/21 2236

## 2021-01-16 NOTE — ED Triage Notes (Signed)
Pt BIB EMS with abdominal pain x 2 hrs, both lower quadrants. Pt has had multiple small bowel movements over the past few days. Pt had an episode of vomiting upon EMS arrival and pt states that he felt a little better. Pt states that he has been eating pies, soup,  and also glasses of milk at bedtime.

## 2021-01-17 ENCOUNTER — Encounter (HOSPITAL_COMMUNITY): Payer: Self-pay | Admitting: Family Medicine

## 2021-01-17 ENCOUNTER — Inpatient Hospital Stay (HOSPITAL_COMMUNITY): Payer: PPO

## 2021-01-17 DIAGNOSIS — E7800 Pure hypercholesterolemia, unspecified: Secondary | ICD-10-CM | POA: Diagnosis present

## 2021-01-17 DIAGNOSIS — I2510 Atherosclerotic heart disease of native coronary artery without angina pectoris: Secondary | ICD-10-CM | POA: Diagnosis present

## 2021-01-17 DIAGNOSIS — D72829 Elevated white blood cell count, unspecified: Secondary | ICD-10-CM

## 2021-01-17 DIAGNOSIS — K56609 Unspecified intestinal obstruction, unspecified as to partial versus complete obstruction: Secondary | ICD-10-CM

## 2021-01-17 DIAGNOSIS — Z87891 Personal history of nicotine dependence: Secondary | ICD-10-CM | POA: Diagnosis not present

## 2021-01-17 DIAGNOSIS — Z7982 Long term (current) use of aspirin: Secondary | ICD-10-CM | POA: Diagnosis not present

## 2021-01-17 DIAGNOSIS — Z20822 Contact with and (suspected) exposure to covid-19: Secondary | ICD-10-CM | POA: Diagnosis present

## 2021-01-17 DIAGNOSIS — Z8249 Family history of ischemic heart disease and other diseases of the circulatory system: Secondary | ICD-10-CM | POA: Diagnosis not present

## 2021-01-17 DIAGNOSIS — I10 Essential (primary) hypertension: Secondary | ICD-10-CM | POA: Diagnosis present

## 2021-01-17 DIAGNOSIS — Z9049 Acquired absence of other specified parts of digestive tract: Secondary | ICD-10-CM | POA: Diagnosis not present

## 2021-01-17 DIAGNOSIS — K219 Gastro-esophageal reflux disease without esophagitis: Secondary | ICD-10-CM | POA: Diagnosis present

## 2021-01-17 DIAGNOSIS — R011 Cardiac murmur, unspecified: Secondary | ICD-10-CM | POA: Diagnosis present

## 2021-01-17 DIAGNOSIS — Z79899 Other long term (current) drug therapy: Secondary | ICD-10-CM | POA: Diagnosis not present

## 2021-01-17 DIAGNOSIS — Z952 Presence of prosthetic heart valve: Secondary | ICD-10-CM | POA: Diagnosis not present

## 2021-01-17 DIAGNOSIS — R188 Other ascites: Secondary | ICD-10-CM | POA: Diagnosis present

## 2021-01-17 DIAGNOSIS — K449 Diaphragmatic hernia without obstruction or gangrene: Secondary | ICD-10-CM | POA: Diagnosis present

## 2021-01-17 DIAGNOSIS — Z87440 Personal history of urinary (tract) infections: Secondary | ICD-10-CM | POA: Diagnosis not present

## 2021-01-17 DIAGNOSIS — R1032 Left lower quadrant pain: Secondary | ICD-10-CM | POA: Diagnosis present

## 2021-01-17 DIAGNOSIS — K565 Intestinal adhesions [bands], unspecified as to partial versus complete obstruction: Secondary | ICD-10-CM | POA: Diagnosis present

## 2021-01-17 LAB — BASIC METABOLIC PANEL
Anion gap: 10 (ref 5–15)
BUN: 19 mg/dL (ref 8–23)
CO2: 26 mmol/L (ref 22–32)
Calcium: 9.1 mg/dL (ref 8.9–10.3)
Chloride: 103 mmol/L (ref 98–111)
Creatinine, Ser: 1.11 mg/dL (ref 0.61–1.24)
GFR, Estimated: >60 mL/min (ref >60)
Glucose, Bld: 110 mg/dL — ABNORMAL HIGH (ref 70–99)
Potassium: 4.1 mmol/L (ref 3.5–5.1)
Sodium: 139 mmol/L (ref 135–145)

## 2021-01-17 LAB — RESP PANEL BY RT-PCR (FLU A&B, COVID) ARPGX2
Influenza A by PCR: NEGATIVE
Influenza B by PCR: NEGATIVE
SARS Coronavirus 2 by RT PCR: NEGATIVE

## 2021-01-17 LAB — CBC
HCT: 39.8 % (ref 39.0–52.0)
Hemoglobin: 13.3 g/dL (ref 13.0–17.0)
MCH: 29.6 pg (ref 26.0–34.0)
MCHC: 33.4 g/dL (ref 30.0–36.0)
MCV: 88.6 fL (ref 80.0–100.0)
Platelets: 179 10*3/uL (ref 150–400)
RBC: 4.49 MIL/uL (ref 4.22–5.81)
RDW: 13.6 % (ref 11.5–15.5)
WBC: 9.5 10*3/uL (ref 4.0–10.5)
nRBC: 0 % (ref 0.0–0.2)

## 2021-01-17 MED ORDER — ACETAMINOPHEN 650 MG RE SUPP: 650.0000 mg | Freq: Four times a day (QID) | RECTAL | Status: DC | PRN

## 2021-01-17 MED ORDER — ONDANSETRON HCL 4 MG/2ML IJ SOLN
4.0000 mg | Freq: Once | INTRAMUSCULAR | Status: AC
  Administered 2021-01-17: 4 mg via INTRAVENOUS
  Filled 2021-01-17: qty 2

## 2021-01-17 MED ORDER — FENTANYL CITRATE PF 50 MCG/ML IJ SOSY
50.0000 ug | PREFILLED_SYRINGE | Freq: Once | INTRAMUSCULAR | Status: AC
  Administered 2021-01-17: 50 ug via INTRAVENOUS
  Filled 2021-01-17: qty 1

## 2021-01-17 MED ORDER — DIATRIZOATE MEGLUMINE & SODIUM 66-10 % PO SOLN
90.0000 mL | Freq: Once | ORAL | Status: AC
  Administered 2021-01-17: 90 mL via NASOGASTRIC
  Filled 2021-01-17 (×2): qty 90

## 2021-01-17 MED ORDER — LACTATED RINGERS IV SOLN: INTRAVENOUS | Status: DC

## 2021-01-17 MED ORDER — HYDROMORPHONE HCL 1 MG/ML IJ SOLN: 1.0000 mg | INTRAMUSCULAR | Status: DC | PRN

## 2021-01-17 MED ORDER — ONDANSETRON HCL 4 MG/2ML IJ SOLN: 4.0000 mg | Freq: Four times a day (QID) | INTRAMUSCULAR | Status: DC | PRN

## 2021-01-17 MED ORDER — LACTATED RINGERS IV BOLUS
1000.0000 mL | Freq: Once | INTRAVENOUS | Status: AC
  Administered 2021-01-17: 1000 mL via INTRAVENOUS

## 2021-01-17 MED ORDER — ACETAMINOPHEN 325 MG PO TABS: 650.0000 mg | ORAL_TABLET | Freq: Four times a day (QID) | ORAL | Status: DC | PRN

## 2021-01-17 MED ORDER — ONDANSETRON HCL 4 MG PO TABS: 4.0000 mg | ORAL_TABLET | Freq: Four times a day (QID) | ORAL | Status: DC | PRN

## 2021-01-17 NOTE — Assessment & Plan Note (Addendum)
Noted aspirin

## 2021-01-17 NOTE — Hospital Course (Addendum)
Jerry Ferguson is [name] 76 y.o. male with medical history significant for HTN, severe aortic stenosis status post TAVR, carotid atherosclerosis status post right carotid endarterectomy, history open appendectomy who presents for evaluation of abdominal pain.  He was found to have Victoria Euceda small bowel obstruction.  He was managed conservatively and has improved.  Plan for discharge 1/14 with plan for outpatient follow up.

## 2021-01-17 NOTE — Assessment & Plan Note (Addendum)
Lisinopril 

## 2021-01-17 NOTE — Consult Note (Signed)
Consult Note  ROSCOE WITTS May 15, 1945  [id number].    Requesting MD: Dr. Lowell Guitar Chief Complaint/Reason for Consult: Small bowel obstruction  HPI:  76 year old male with medical history significant for coronary artery disease, hypertension, aortic stenosis status post TAVR, hypercholesterolemia, GERD who presented to [facility] emergency department due to abdominal pain.  Pain began yesterday morning morning (1/11) and has progressively worsened with associated nausea and emesis.  Emesis was nonbloody.  He has not experienced symptoms like this before. He denies fever, chills, chest pain, shortness of breath, diarrhea.  Work-up in the ED significant for WBC 14 (now 9.5), CT abdomen/pelvis showing small bowel obstruction transitioning in the distal ileum with mucosal enhancement possibly suggestive of enteritis or ischemia but no pneumatosis, free air, or portal venous gas.  Small hiatal hernia, small umbilical and inguinal fat-containing hernias. Is admitted to the hospitalist service and general surgery asked to see in regard to his small bowel obstruction.  A NGT was placed.  Currently abdominal pain significantly improved and only a little sore in supraumbilical region. No nausea or emesis since NGT placement. Last BM was yesterday prior to ED presentation. He has not been passing flatus. He has constipation intermittently at baseline  Substance use: Former cigarette smoker Allergies: NKDA Blood thinners: none Past Abdominal surgeries: Appendectomy   ROS: Review of Systems  Constitutional:  Negative for chills and fever.  Respiratory:  Negative for cough, shortness of breath and wheezing.   Cardiovascular:  Negative for chest pain, palpitations and leg swelling.  Gastrointestinal:  Positive for abdominal pain, nausea and vomiting. Negative for diarrhea.  Genitourinary: Negative.    Family History  Problem Relation Age of Onset   Colon cancer Father        metastatic    Other Father        DJD   Arthritis Father 70   Healthy Mother        Living-87   Prostate cancer Brother    Heart attack Paternal Grandfather    Heart disease Paternal Grandfather    Cancer Paternal Uncle    Colon cancer Sister    Hypertension Daughter        x1   Healthy Daughter        x1   Esophageal cancer Neg Hx    Stomach cancer Neg Hx    Rectal cancer Neg Hx     Past Medical History:  Diagnosis Date   Abnormal liver function test    Acute bronchitis    Carotid artery occlusion    Bilateral Bruit   Cataract    Coronary artery disease involving native coronary artery of native heart without angina pectoris    DJD (degenerative joint disease)    Dyspnea    GERD (gastroesophageal reflux disease)    Heart murmur    History of UTI    Hypercholesteremia    Hypertension    Lumbar back pain    Overweight(278.02)    Pulmonary nodules    Multiple small pulmonary nodules scattered throughout both lungs, 12 month CT rec if pt high risk   S/P TAVR (transcatheter aortic valve replacement)    26 mm Edwards Sapien 3 transcatheter heart valve placed via percutaneous right transfemoral approach    Severe aortic stenosis    Severe aortic stenosis     Past Surgical History:  Procedure Laterality Date   APPENDECTOMY     CAROTID ENDARTERECTOMY Right 09/19/2019   COLONOSCOPY     CORONARY  ANGIOGRAPHY N/A 02/04/2018   Procedure: CORONARY ANGIOGRAPHY;  Surgeon: Tonny Bollman, MD;  Location: [facility] INVASIVE CV LAB;  Service: Cardiovascular;  Laterality: N/A;   ENDARTERECTOMY Right 09/19/2019   Procedure: RIGHT ENDARTERECTOMY CAROTID;  Surgeon: Cephus Shelling, MD;  Location: [facility] OR;  Service: Vascular;  Laterality: Right;   PATCH ANGIOPLASTY Right 09/19/2019   Procedure: PATCH ANGIOPLASTY RIGHT CAROTID;  Surgeon: Cephus Shelling, MD;  Location: [facility] OR;  Service: Vascular;  Laterality: Right;   RIGHT HEART CATH N/A 02/04/2018   Procedure: RIGHT HEART CATH;  Surgeon: Tonny Bollman, MD;  Location: [facility] INVASIVE CV LAB;  Service: Cardiovascular;  Laterality: N/A;   TEE WITHOUT CARDIOVERSION N/A 02/23/2018   Procedure: TRANSESOPHAGEAL ECHOCARDIOGRAM (TEE);  Surgeon: Tonny Bollman, MD;  Location: [facility] INVASIVE CV LAB;  Service: Open Heart Surgery;  Laterality: N/A;   TRANSCATHETER AORTIC VALVE REPLACEMENT, TRANSFEMORAL  02/23/2018   TRANSCATHETER AORTIC VALVE REPLACEMENT, TRANSFEMORAL N/A 02/23/2018   Procedure: TRANSCATHETER AORTIC VALVE REPLACEMENT, TRANSFEMORAL;  Surgeon: Tonny Bollman, MD;  Location: [facility] INVASIVE CV LAB;  Service: Open Heart Surgery;  Laterality: N/A;    Social History:  reports that he quit smoking about 32 years ago. His smoking use included cigarettes. He has a 15.00 pack-year smoking history. He has never used smokeless tobacco. He reports that he does not currently use alcohol. He reports that he does not use drugs.  Allergies: No Known Allergies  Medications Prior to Admission  Medication Sig Dispense Refill   albuterol (VENTOLIN HFA) 108 (90 Base) MCG/ACT inhaler Inhale 2 puffs into the lungs every 6 (six) hours as needed. (Patient taking differently: Inhale 2 puffs into the lungs every 6 (six) hours as needed for shortness of breath or wheezing.) 18 g 0   aspirin EC 81 MG tablet Take 1 tablet (81 mg total) by mouth daily. Swallow whole. 90 tablet 3   atorvastatin (LIPITOR) 20 MG tablet Take 1 tablet (20 mg total) by mouth daily. 90 tablet 3   cyclobenzaprine (FLEXERIL) 5 MG tablet 1 tab po q hs as needed neck pain (Patient taking differently: Take 5 mg by mouth at bedtime as needed (neck pain).) 20 tablet 0   diclofenac Sodium (VOLTAREN) 1 % GEL Apply 2 g topically 4 (four) times daily. To affected joint. 100 g 11   fluticasone (FLONASE) 50 MCG/ACT nasal spray SPRAY 2 SPRAYS INTO EACH NOSTRIL EVERY DAY (Patient taking differently: Place 2 sprays into both nostrils daily.) 48 mL 1   lisinopril (ZESTRIL) 5 MG tablet Take 1 tablet (5 mg total) by  mouth daily. 90 tablet 3    Blood pressure 135/60, pulse 64, temperature 97.7 F (36.5 C), resp. rate 16, height 5\' 11"  (1.803 m), weight 94.3 kg, SpO2 99 %. Physical Exam: General: pleasant, WD, male who is laying in bed in NAD HEENT: head is normocephalic, atraumatic.  Sclera are noninjected.  Pupils equal and round. EOMs intact.  Ears and nose without any masses or lesions.  Mouth is pink and moist Heart: regular, rate, and rhythm.  Normal s1,s2. No obvious murmurs, gallops, or rubs noted.  Palpable radial and pedal pulses bilaterally Lungs: CTAB, no wheezes, rhonchi, or rales noted.  Respiratory effort nonlabored Abd: soft, ND, +BS, no masses, or organomegaly. Mild focal TTP in suprapubic region without rebound or guarding. NGT in place with bilious fluid in tubing - currently clamped MSK: all 4 extremities are symmetrical with no cyanosis, clubbing, or edema. Skin: warm and dry with no masses, lesions, or rashes  Neuro: Cranial nerves 2-12 grossly intact, sensation is normal throughout Psych: A&Ox3 with an appropriate affect.    Results for orders placed or performed during the hospital encounter of 01/16/21 (from the past 48 hour(s))  Lipase, blood     Status: Abnormal   Collection Time: 01/16/21 10:28 PM  Result Value Ref Range   Lipase 54 (H) 11 - 51 U/L    Comment: Performed at [facility], [address], [place], [place] [address]  Comprehensive metabolic panel     Status: Abnormal   Collection Time: 01/16/21 10:28 PM  Result Value Ref Range   Sodium 138 135 - 145 mmol/L   Potassium 4.2 3.5 - 5.1 mmol/L   Chloride 104 98 - 111 mmol/L   CO2 24 22 - 32 mmol/L   Glucose, Bld 140 (H) 70 - 99 mg/dL    Comment: Glucose reference range applies only to samples taken after fasting for at least 8 hours.   BUN 14 8 - 23 mg/dL   Creatinine, Ser 6.04 0.61 - 1.24 mg/dL   Calcium 9.9 8.9 - 54.0 mg/dL   Total Protein 7.8 6.5 - 8.1 g/dL   Albumin 4.7 3.5 - 5.0 g/dL    AST 23 15 - 41 U/L   ALT 21 0 - 44 U/L   Alkaline Phosphatase 66 38 - 126 U/L   Total Bilirubin 2.3 (H) 0.3 - 1.2 mg/dL   GFR, Estimated >98 >11 mL/min    Comment: (NOTE) Calculated using the CKD-EPI Creatinine Equation (2021)    Anion gap 10 5 - 15    Comment: Performed at [facility], [address] [place], [place] [address]  CBC     Status: Abnormal   Collection Time: 01/16/21 10:28 PM  Result Value Ref Range   WBC 14.0 (H) 4.0 - 10.5 K/uL   RBC 5.26 4.22 - 5.81 MIL/uL   Hemoglobin 15.4 13.0 - 17.0 g/dL   HCT 29.5 62.1 - 30.8 %   MCV 87.8 80.0 - 100.0 fL   MCH 29.3 26.0 - 34.0 pg   MCHC 33.3 30.0 - 36.0 g/dL   RDW 65.7 84.6 - 96.2 %   Platelets 258 150 - 400 K/uL   nRBC 0.0 0.0 - 0.2 %    Comment: Performed at [facility], [address] [place], [place] [address]  Lactic acid, plasma     Status: None   Collection Time: 01/16/21 11:14 PM  Result Value Ref Range   Lactic Acid, Venous 1.7 0.5 - 1.9 mmol/L    Comment: Performed at [facility], [address] [place], [place] [address]  Resp Panel by RT-PCR (Flu A&B, Covid) Nasopharyngeal Swab     Status: None   Collection Time: 01/17/21  3:30 AM   Specimen: Nasopharyngeal Swab; Nasopharyngeal(NP) swabs in vial transport medium  Result Value Ref Range   SARS Coronavirus 2 by RT PCR NEGATIVE NEGATIVE    Comment: (NOTE) SARS-CoV-2 target nucleic acids are NOT DETECTED.  The SARS-CoV-2 RNA is generally detectable in upper respiratory specimens during the acute phase of infection. The lowest concentration of SARS-CoV-2 viral copies this assay can detect is 138 copies/mL. A negative result does not preclude SARS-Cov-2 infection and should not be used as the sole basis for treatment or other patient management decisions. A negative result may occur with  improper specimen collection/handling, submission of specimen other than nasopharyngeal swab, presence of viral  mutation(s) within the areas targeted by  this assay, and inadequate number of viral copies(<138 copies/mL). A negative result must be combined with clinical observations, patient history, and epidemiological information. The expected result is Negative.  Fact Sheet for Patients:  BloggerCourse.com  Fact Sheet for Healthcare Providers:  SeriousBroker.it  This test is no t yet approved or cleared by the [place] FDA and  has been authorized for detection and/or diagnosis of SARS-CoV-2 by FDA under an Emergency Use Authorization (EUA). This EUA will remain  in effect (meaning this test can be used) for the duration of the COVID-19 declaration under Section 564(b)(1) of the Act, 21 U.S.C.section 360bbb-3(b)(1), unless the authorization is terminated  or revoked sooner.       Influenza A by PCR NEGATIVE NEGATIVE   Influenza B by PCR NEGATIVE NEGATIVE    Comment: (NOTE) The Xpert Xpress SARS-CoV-2/FLU/RSV plus assay is intended as an aid in the diagnosis of influenza from Nasopharyngeal swab specimens and should not be used as a sole basis for treatment. Nasal washings and aspirates are unacceptable for Xpert Xpress SARS-CoV-2/FLU/RSV testing.  Fact Sheet for Patients: BloggerCourse.com  Fact Sheet for Healthcare Providers: SeriousBroker.it  This test is not yet approved or cleared by the [place] FDA and has been authorized for detection and/or diagnosis of SARS-CoV-2 by FDA under an Emergency Use Authorization (EUA). This EUA will remain in effect (meaning this test can be used) for the duration of the COVID-19 declaration under Section 564(b)(1) of the Act, 21 U.S.C. section 360bbb-3(b)(1), unless the authorization is terminated or revoked.  Performed at [facility], [address], [place], [place] [address]   Basic metabolic panel     Status:  Abnormal   Collection Time: 01/17/21  5:38 AM  Result Value Ref Range   Sodium 139 135 - 145 mmol/L   Potassium 4.1 3.5 - 5.1 mmol/L   Chloride 103 98 - 111 mmol/L   CO2 26 22 - 32 mmol/L   Glucose, Bld 110 (H) 70 - 99 mg/dL    Comment: Glucose reference range applies only to samples taken after fasting for at least 8 hours.   BUN 19 8 - 23 mg/dL   Creatinine, Ser 6.04 0.61 - 1.24 mg/dL   Calcium 9.1 8.9 - 54.0 mg/dL   GFR, Estimated >98 >11 mL/min    Comment: (NOTE) Calculated using the CKD-EPI Creatinine Equation (2021)    Anion gap 10 5 - 15    Comment: Performed at [facility], [address], [place], [place] [address]  CBC     Status: None   Collection Time: 01/17/21  5:38 AM  Result Value Ref Range   WBC 9.5 4.0 - 10.5 K/uL   RBC 4.49 4.22 - 5.81 MIL/uL   Hemoglobin 13.3 13.0 - 17.0 g/dL   HCT 29.5 62.1 - 30.8 %   MCV 88.6 80.0 - 100.0 fL   MCH 29.6 26.0 - 34.0 pg   MCHC 33.4 30.0 - 36.0 g/dL   RDW 65.7 84.6 - 96.2 %   Platelets 179 150 - 400 K/uL   nRBC 0.0 0.0 - 0.2 %    Comment: Performed at [facility], [address], [place], [place] [address]   DG Abdomen 1 View  Result Date: 01/17/2021 CLINICAL DATA:  NG G-tube placement EXAM: ABDOMEN - 1 VIEW COMPARISON:  CT 01/16/2021 FINDINGS: NG tube tip is in the distal stomach. Gas within mildly prominent central and right lower quadrant small bowel loops. IMPRESSION: NG tube  tip in the distal stomach. Electronically Signed   By: Charlett NoseKevin  Dover M.D.   On: 01/17/2021 03:50   CT ABDOMEN PELVIS W CONTRAST  Result Date: 01/16/2021 CLINICAL DATA:  Diffuse abdominal pain, nausea and vomiting. EXAM: CT ABDOMEN AND PELVIS WITH CONTRAST TECHNIQUE: Multidetector CT imaging of the abdomen and pelvis was performed using the standard protocol following bolus administration of intravenous contrast. CONTRAST:  80mL OMNIPAQUE IOHEXOL 350 MG/ML SOLN COMPARISON:  CTA chest, abdomen and pelvis 02/05/2018  FINDINGS: Lower chest: Stable 4 mm subpleural right lower lobe nodule, series 6 axial 7. On this same image there is a stable 3 mm subpleural right lower lobe nodule posterior medially. Lung bases are clear of infiltrates and further nodules. Interval new TEVAR stenting in the aortic valve plane. The cardiac size is normal. 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 Hepatobiliary: Gallbladder and bile ducts unremarkable. No liver mass is seen. Pancreas: Unremarkable. No pancreatic ductal dilatation or surrounding inflammatory changes. Spleen: Slightly prominent , 12.8 cm in length with uniform enhancement. Adrenals/Urinary Tract: There is no adrenal mass. Perinephric stranding is similar to prior study with unremarkable renal cortex. There is no hydronephrosis or stone disease. There is mild bladder thickening versus underdistention, with bladder base impression by an enlarged prostate once again. Stomach/Bowel: The appendix is not seen and is suspected surgically absent. Small hiatal hernia. The gastric wall unremarkable with slight gastric fluid distention. There is dilatation of the mid to lower abdominal small bowel segments up to 3.2 cm. The transitional segment is on series 2 axial 72 where a sharply angled distal ileal loop is noted below the level of the cecum suggesting adhesive disease as etiology. There is mucosal enhancement in multiple of the dilated small bowel segments in the lower abdomen, and fluid in the colon without colonic thickening or inflammatory change. No bowel pneumatosis is seen. Vascular/Lymphatic: The abdominal aorta is heavily calcified with moderate patchy calcification continuing into the iliacs. There is no AAA. No lymphadenopathy is seen. Reproductive: Enlarged prostate gland measuring 5 cm impressing on the bladder base. Unremarkable seminal vesicles. Other: Small volume ascites is noted in the distal paracolic gutters and pelvis, and scattered along the mesenteric  folds. There is no mesenteric inflammatory reaction identified. There are small umbilical and inguinal fat hernias. Multiple bilateral pelvic phleboliths. Musculoskeletal: There is osteopenia, degenerative changes of the spine and mild chronic and unchanged anterior wedge compression fractures of the T12 and L2 vertebral bodies. There is mild hip DJD. There is no worrisome regional skeletal lesion IMPRESSION: 1. Low to intermediate grade small bowel obstruction transitioning in the distal ileum at a sharply angulated segment, suggest adhesive disease as etiology. There is mucosal enhancement in multiple dilated segments which could indicate enteritis or ischemia but there is no pneumatosis, free air or portal venous gas. Surgical consult recommended. 2. Small volume abdominal and pelvic ascites. No focal mesenteric inflammatory reaction/edema. 3. Fluid in the colon also is noted but no wall thickening or inflammatory reaction. 4. Prostatomegaly with bladder base impression mild bladder thickening versus underdistention, similar to the prior study. 5. Interval TEVAR. Heavy aortic and moderate iliac arterial calcific plaques. Coronary artery calcifications. 6. Small hiatal hernia with small umbilical and inguinal fat hernias. 7. Chronic mild compression fractures of T12 and L2. Osteopenia and degenerative change. Electronically Signed   By: Almira BarKeith  Chesser M.D.   On: 01/16/2021 23:58      Assessment/Plan SBO, likely adhesive - history of appendectomy - CT 1/11 w/ mall  bowel obstruction transitioning in the distal ileum with mucosal enhancement possibly suggestive of enteritis or ischemia but no pneumatosis, free air, or portal venous gas.  Small hiatal hernia, small umbilical and inguinal fat-containing hernias. - No current indication for emergency surgery. Symptoms significantly improved today after NGT - continue NGT for decompression and keep NPO - NGT output not recorded but patient reports 1-2 canisters  of output - Start SBO protocol - Keep K > 4 and Mg > 2 for bowel function - Mobilize for bowel function - Hopefully patient will improve with conservative management. If patient fails to improve with conservative management, he may require exploratory surgery during admission - Agree with medical admission. We will follow with you.   FEN: NPO/NGT ID: none indicated VTE: okay for chemical prophylaxis from surgical standpoint  I reviewed ED provider notes, hospitalist notes, last 24 h vitals and pain scores, last 24 h labs and trends, and last 24 h imaging results.  This care required high  level of medical decision making.   Eric FormMartha H Devonda Pequignot, [facility] 01/17/2021, 9:56 AM Please see Amion for pager number during day hours 7:00am-4:30pm

## 2021-01-17 NOTE — Plan of Care (Signed)
Plan of care initiated and discussed with the patient. 

## 2021-01-17 NOTE — Assessment & Plan Note (Addendum)
CT findings show Low to intermediate grade SBO transitioning in the distal ileum, ? Adhesive disease, ? Enteritis or ischemia.  Small volume abdominal/pelvic ascites. KUB without evidence of SBO Surgery c/s, appreciate recs - ok for d/c today if continued bowel function He's tolerated soft diet

## 2021-01-17 NOTE — Assessment & Plan Note (Signed)
Suspect reactive

## 2021-01-17 NOTE — H&P (Signed)
History and Physical    Jerry Ferguson KZL:[id number] DOB: 07-13-1945 DOA: 01/16/2021  PCP: Jerry Pai, PA-C   Patient coming from: Home  Chief Complaint: Abdominal pain  HPI: Jerry Ferguson is a 76 y.o. male with medical history significant for HTN, severe aortic stenosis status post TAVR, carotid atherosclerosis status post right carotid endarterectomy who presents for evaluation of abdominal pain.  He reports abdominal pain that started yesterday morning when he was working on his car with his son-in-law.  He thought he just strained some abdominal muscles but over the next 24 hours pain progressively worsened to the point where he became very nauseous and vomited 3 times just before EMS came to transport him.  He reports after he vomited he felt like his abdominal pain was slightly better and cerumen had increased pain again and was given IV pain medication which helped.  Abdominal pain is worsened by movement and changing of body position and improved after IV pain medication and for a short time after vomiting.  He does not have any fever.  Denies any chest pain, palpitations, urinary symptoms, diarrhea.  There is no blood in the vomit.  He has never had this type of pain before.  He had an appendectomy over 50 years ago but no other abdominal surgeries. Is a former smoker but quit over 25 years ago.  Denies alcohol or illicit drug use.  ED Course: Hemodynamically stable in the emergency room.  Afebrile in the emergency room.  CT of the abdomen pelvis showed small bowel obstruction and distal ileum consistent with adhesion.  Lab work showed WBC 14,000 hemoglobin 15.4 hematocrit 46.2 platelets 258,000, odium 138 potassium 4.2 chloride 104 bicarb 24 creatinine 1.14 BUN 14 glucose 140 lipase 54 alk phos a 66 AST 23 ALT 21 bilirubin 2.3.  Surgery was consulted by the ER physician.  NG tube to low intermittent wall suction was ordered.  Hospitalist service was asked to admit for further  management  Review of Systems:  Positive for abdominal pain, nausea, vomiting. Negative for fever, cough, SOB, chest pain, palpitations.  All other systems negative  Past Medical History:  Diagnosis Date   Abnormal liver function test    Acute bronchitis    Carotid artery occlusion    Bilateral Bruit   Cataract    Coronary artery disease involving native coronary artery of native heart without angina pectoris    DJD (degenerative joint disease)    Dyspnea    GERD (gastroesophageal reflux disease)    Heart murmur    History of UTI    Hypercholesteremia    Hypertension    Lumbar back pain    Overweight(278.02)    Pulmonary nodules    Multiple small pulmonary nodules scattered throughout both lungs, 12 month CT rec if pt high risk   S/P TAVR (transcatheter aortic valve replacement)    26 mm Edwards Sapien 3 transcatheter heart valve placed via percutaneous right transfemoral approach    Severe aortic stenosis    Severe aortic stenosis     Past Surgical History:  Procedure Laterality Date   APPENDECTOMY     CAROTID ENDARTERECTOMY Right 09/19/2019   COLONOSCOPY     CORONARY ANGIOGRAPHY N/A 02/04/2018   Procedure: CORONARY ANGIOGRAPHY;  Surgeon: Sherren Mocha, MD;  Location: [facility] [facility];  Service: Cardiovascular;  Laterality: N/A;   ENDARTERECTOMY Right 09/19/2019   Procedure: RIGHT ENDARTERECTOMY CAROTID;  Surgeon: Marty Heck, MD;  Location: [facility];  Service: Vascular;  Laterality:  Right;   PATCH ANGIOPLASTY Right 09/19/2019   Procedure: PATCH ANGIOPLASTY RIGHT CAROTID;  Surgeon: Marty Heck, MD;  Location: [facility];  Service: Vascular;  Laterality: Right;   RIGHT HEART CATH N/A 02/04/2018   Procedure: RIGHT HEART CATH;  Surgeon: Sherren Mocha, MD;  Location: [facility];  Service: Cardiovascular;  Laterality: N/A;   TEE WITHOUT CARDIOVERSION N/A 02/23/2018   Procedure: TRANSESOPHAGEAL ECHOCARDIOGRAM (TEE);  Surgeon: Sherren Mocha, MD;  Location:  [place] [facility];  Service: Open Heart Surgery;  Laterality: N/A;   TRANSCATHETER AORTIC VALVE REPLACEMENT, TRANSFEMORAL  02/23/2018   TRANSCATHETER AORTIC VALVE REPLACEMENT, TRANSFEMORAL N/A 02/23/2018   Procedure: TRANSCATHETER AORTIC VALVE REPLACEMENT, TRANSFEMORAL;  Surgeon: Sherren Mocha, MD;  Location: [facility];  Service: Open Heart Surgery;  Laterality: N/A;    Social History  reports that he quit smoking about 32 years ago. His smoking use included cigarettes. He has a 15.00 pack-year smoking history. He has never used smokeless tobacco. He reports that he does not currently use alcohol. He reports that he does not use drugs.  No Known Allergies  Family History  Problem Relation Age of Onset   Colon cancer Father        metastatic   Other Father        DJD   Arthritis Father 7   Healthy Mother        Living-87   Prostate cancer Brother    Heart attack Paternal Grandfather    Heart disease Paternal Grandfather    Cancer Paternal Uncle    Colon cancer Sister    Hypertension Daughter        x1   Healthy Daughter        x1   Esophageal cancer Neg Hx    Stomach cancer Neg Hx    Rectal cancer Neg Hx      Prior to Admission medications   Medication Sig Start Date End Date Taking? Authorizing Provider  albuterol (VENTOLIN HFA) 108 (90 Base) MCG/ACT inhaler Inhale 2 puffs into the lungs every 6 (six) hours as needed. 12/15/18   [name], [name], PA-C  aspirin EC 81 MG tablet Take 1 tablet (81 mg total) by mouth daily. Swallow whole. 07/27/19   Eileen Stanford, PA-C  atorvastatin (LIPITOR) 20 MG tablet Take 1 tablet (20 mg total) by mouth daily. 02/23/20   [name], [name], PA-C  cyclobenzaprine (FLEXERIL) 5 MG tablet 1 tab po q hs as needed neck pain 08/27/20   [name], [name], PA-C  diclofenac Sodium (VOLTAREN) 1 % GEL Apply 2 g topically 4 (four) times daily. To affected joint. 09/20/20   Rosemarie Ax, MD  fluticasone [facility]) 50 MCG/ACT nasal spray SPRAY 2  SPRAYS INTO EACH NOSTRIL EVERY DAY 02/21/19   [name], [name], PA-C  lisinopril (ZESTRIL) 5 MG tablet Take 1 tablet (5 mg total) by mouth daily. 02/22/20   Jerry Pai, PA-C    Physical Exam: Vitals:   01/16/21 2227 01/16/21 2228  BP:  (!) 101/56  Pulse:  95  Resp:  16  Temp:  98.7 F (37.1 C)  TempSrc:  Oral  SpO2:  99%  Weight: 94.3 kg   Height: _0  (1.803 m)     Constitutional: NAD, calm, comfortable Vitals:   01/16/21 2227 01/16/21 2228  BP:  (!) 101/56  Pulse:  95  Resp:  16  Temp:  98.7 F (37.1 C)  TempSrc:  Oral  SpO2:  99%  Weight: 94.3 kg  Height: _0  (1.803 m)    General: WDWN, Alert and oriented x3.  Eyes: EOMI, PERRL, conjunctivae normal.  Sclera nonicteric HENT:  [place]/AT, external ears normal.  Nares patent without epistasis.  Mucous membranes are dry Neck: Soft, normal range of motion, supple, no masses, Trachea midline Respiratory: clear to auscultation bilaterally, no wheezing, no crackles. Normal respiratory effort. No accessory muscle use.  Cardiovascular: Regular rate and rhythm, no murmurs / rubs / gallops. No extremity edema. Abdomen: Soft, Diffuse tenderness, nondistended, no rebound or guarding.  No masses palpated. Bowel sounds hypoactive Musculoskeletal: FROM. no cyanosis. No joint deformity upper and lower extremities. Normal muscle tone.  Skin: Warm, dry, intact no rashes, lesions, ulcers. No induration Neurologic: CN 2-12 grossly intact.  Normal speech.  Sensation intact to light touch Psychiatric: Normal judgment and insight.  Normal mood.    Labs on Admission: I have personally reviewed following labs and imaging studies  CBC: Recent Labs  Lab 01/16/21 2228  WBC 14.0*  HGB 15.4  HCT 46.2  MCV 87.8  PLT 176    Basic Metabolic Panel: Recent Labs  Lab 01/16/21 2228  NA 138  K 4.2  CL 104  CO2 24  GLUCOSE 140*  BUN 14  CREATININE 1.14  CALCIUM 9.9    GFR: Estimated Creatinine Clearance: 65.6 mL/min (by C-G  formula based on SCr of 1.14 mg/dL).  Liver Function Tests: Recent Labs  Lab 01/16/21 2228  AST 23  ALT 21  ALKPHOS 66  BILITOT 2.3*  PROT 7.8  ALBUMIN 4.7    Urine analysis:    Component Value Date/Time   COLORURINE YELLOW 09/16/2019 1230   APPEARANCEUR HAZY (A) 09/16/2019 1230   LABSPEC 1.025 09/16/2019 1230   PHURINE 5.0 09/16/2019 1230   GLUCOSEU NEGATIVE 09/16/2019 1230   GLUCOSEU NEGATIVE 11/04/2013 1038   HGBUR NEGATIVE 09/16/2019 1230   BILIRUBINUR NEGATIVE 09/16/2019 1230   KETONESUR NEGATIVE 09/16/2019 1230   PROTEINUR NEGATIVE 09/16/2019 1230   UROBILINOGEN 0.2 11/04/2013 1038   NITRITE NEGATIVE 09/16/2019 1230   LEUKOCYTESUR TRACE (A) 09/16/2019 1230    Radiological Exams on Admission: CT ABDOMEN PELVIS W CONTRAST  Result Date: 01/16/2021 CLINICAL DATA:  Diffuse abdominal pain, nausea and vomiting. EXAM: CT ABDOMEN AND PELVIS WITH CONTRAST TECHNIQUE: Multidetector CT imaging of the abdomen and pelvis was performed using the standard protocol following bolus administration of intravenous contrast. CONTRAST:  62m OMNIPAQUE IOHEXOL 350 MG/ML SOLN COMPARISON:  CTA chest, abdomen and pelvis 02/05/2018 FINDINGS: Lower chest: Stable 4 mm subpleural right lower lobe nodule, series 6 axial 7. On this same image there is a stable 3 mm subpleural right lower lobe nodule posterior medially. Lung bases are clear of infiltrates and further nodules. Interval new TEVAR stenting in the aortic valve plane. The cardiac size is normal. 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 Hepatobiliary: Gallbladder and bile ducts unremarkable. No liver mass is seen. Pancreas: Unremarkable. No pancreatic ductal dilatation or surrounding inflammatory changes. Spleen: Slightly prominent , 12.8 cm in length with uniform enhancement. Adrenals/Urinary Tract: There is no adrenal mass. Perinephric stranding is similar to prior study with unremarkable renal cortex. There is no hydronephrosis or  stone disease. There is mild bladder thickening versus underdistention, with bladder base impression by an enlarged prostate once again. Stomach/Bowel: The appendix is not seen and is suspected surgically absent. Small hiatal hernia. The gastric wall unremarkable with slight gastric fluid distention. There is dilatation of the mid to lower abdominal small bowel segments up to  3.2 cm. The transitional segment is on series 2 axial 72 where a sharply angled distal ileal loop is noted below the level of the cecum suggesting adhesive disease as etiology. There is mucosal enhancement in multiple of the dilated small bowel segments in the lower abdomen, and fluid in the colon without colonic thickening or inflammatory change. No bowel pneumatosis is seen. Vascular/Lymphatic: The abdominal aorta is heavily calcified with moderate patchy calcification continuing into the iliacs. There is no AAA. No lymphadenopathy is seen. Reproductive: Enlarged prostate gland measuring 5 cm impressing on the bladder base. Unremarkable seminal vesicles. Other: Small volume ascites is noted in the distal paracolic gutters and pelvis, and scattered along the mesenteric folds. There is no mesenteric inflammatory reaction identified. There are small umbilical and inguinal fat hernias. Multiple bilateral pelvic phleboliths. Musculoskeletal: There is osteopenia, degenerative changes of the spine and mild chronic and unchanged anterior wedge compression fractures of the T12 and L2 vertebral bodies. There is mild hip DJD. There is no worrisome regional skeletal lesion IMPRESSION: 1. Low to intermediate grade small bowel obstruction transitioning in the distal ileum at a sharply angulated segment, suggest adhesive disease as etiology. There is mucosal enhancement in multiple dilated segments which could indicate enteritis or ischemia but there is no pneumatosis, free air or portal venous gas. Surgical consult recommended. 2. Small volume abdominal  and pelvic ascites. No focal mesenteric inflammatory reaction/edema. 3. Fluid in the colon also is noted but no wall thickening or inflammatory reaction. 4. Prostatomegaly with bladder base impression mild bladder thickening versus underdistention, similar to the prior study. 5. Interval TEVAR. Heavy aortic and moderate iliac arterial calcific plaques. Coronary artery calcifications. 6. Small hiatal hernia with small umbilical and inguinal fat hernias. 7. Chronic mild compression fractures of T12 and L2. Osteopenia and degenerative change. Electronically Signed   By: Telford Nab M.D.   On: 01/16/2021 23:58    Assessment/Plan Principal Problem:   SBO (small bowel obstruction) Mr. Alipio is admitted to Med Surg floor.  NGT to LIWS placed.  Surgery consulted by ER physician who reported that surgery, Dr. Marlou Starks, stated to have dayteam call surgery in am for consult.  Dilaudid for severe pain provided.  NPO  Active Problems:   Essential hypertension Monitor BP. Hold lisinopril while NPO with NGT     Leukocytosis No sign of infection. Appears to be left shift from SBO with vomiting.  Recheck CBC in am    S/P TAVR (transcatheter aortic valve replacement)   DVT prophylaxis: Early ambulation, Padua score low  Code Status:   Full Code  Family Communication:  Diagnosis and plan discussed with patient.  He verbalized understanding agrees with plan.  Further recommendations to follow as clinical indicated Disposition Plan:   Patient is from:  Home  Anticipated DC to:  Home  Anticipated DC date:  Anticipate 2 midnight or more stay in the hospital  Time spent on admission: 55 minutes  Consults called:  Surgery was consulted by ER physician, Dr Marlou Starks who reportedly stated  Admission status:  Inpatient  Yevonne Aline Kaeo Jacome MD [facility]  How to contact the [facility] Attending or Consulting provider [place] or covering provider during after hours [place], for this patient?   Check the care  team in [facility] and look for a) attending/consulting [facility] provider listed and b) the [facility] team listed Log into [email or web address] and use [place]'s universal password to access. If you do not have the password, please contact the hospital operator.  Locate the [facility] provider you are looking for under [facility] and page to a number that you can be directly reached. If you still have difficulty reaching the provider, please page the [facility] (Director on Call) for the Hospitalists listed on amion for assistance.  01/17/2021, 3:06 AM

## 2021-01-17 NOTE — ED Provider Notes (Signed)
[facility]-EMERGENCY DEPT Provider Note   CSN: [id number] Arrival date & time: 01/16/21  2219     History  Chief Complaint  Patient presents with   Abdominal Pain    Jerry Ferguson is a 76 y.o. male.  The history is provided by the patient and a relative.  Abdominal Pain Pain location:  LLQ and RLQ Pain quality: aching and cramping   Pain radiates to:  Does not radiate Pain severity:  Moderate Onset quality:  Gradual Duration:  24 hours Timing:  Constant Progression:  Worsening Chronicity:  New Relieved by:  Vomiting Worsened by:  Movement, palpation and position changes Associated symptoms: constipation, nausea and vomiting   Associated symptoms: no chest pain, no diarrhea, no dysuria, no fever, no hematemesis and no hematochezia   Patient reports gradual onset of abdominal pain over the past 24 hours.  He has never had this before.  Previous surgery includes appendectomy.  He reports on the way to the hospital he vomited which made him feel improved    Home Medications Prior to Admission medications   Medication Sig Start Date End Date Taking? Authorizing Provider  albuterol (VENTOLIN HFA) 108 (90 Base) MCG/ACT inhaler Inhale 2 puffs into the lungs every 6 (six) hours as needed. 12/15/18   [name], Ramon Dredge, PA-C  amoxicillin (AMOXIL) 500 MG capsule Take 4 tablets (2000 mg) ONE HOUR prior to any dental procedure or cleaning Patient not taking: Reported on 10/16/2020 07/27/19   Janetta Hora, PA-C  aspirin EC 81 MG tablet Take 1 tablet (81 mg total) by mouth daily. Swallow whole. 07/27/19   Janetta Hora, PA-C  atorvastatin (LIPITOR) 20 MG tablet Take 1 tablet (20 mg total) by mouth daily. 02/23/20   [name], Ramon Dredge, PA-C  cyclobenzaprine (FLEXERIL) 5 MG tablet 1 tab po q hs as needed neck pain 08/27/20   [name], Ramon Dredge, PA-C  diclofenac Sodium (VOLTAREN) 1 % GEL Apply 2 g topically 4 (four) times daily. To affected joint. 09/20/20   Myra Rude, MD  fluticasone [facility]) 50 MCG/ACT nasal spray SPRAY 2 SPRAYS INTO EACH NOSTRIL EVERY DAY 02/21/19   [name], Ramon Dredge, PA-C  HYDROcodone-acetaminophen (NORCO/VICODIN) 5-325 MG tablet Take 1 tablet by mouth every 6 (six) hours as needed for moderate pain. Patient not taking: Reported on 10/16/2020 09/20/19   Dara Lords, PA-C  lisinopril (ZESTRIL) 5 MG tablet Take 1 tablet (5 mg total) by mouth daily. 02/22/20   [name], Ramon Dredge, PA-C  predniSONE (DELTASONE) 5 MG tablet Take 6 pills for first day, 5 pills second day, 4 pills third day, 3 pills fourth day, 2 pills the fifth day, and 1 pill sixth day. Patient not taking: Reported on 10/16/2020 09/20/20   Myra Rude, MD      Allergies    Patient has no known allergies.    Review of Systems   Review of Systems  Constitutional:  Negative for fever.  Cardiovascular:  Negative for chest pain.  Gastrointestinal:  Positive for abdominal pain, constipation, nausea and vomiting. Negative for blood in stool, diarrhea, hematemesis and hematochezia.  Genitourinary:  Negative for dysuria.  Neurological:  Positive for light-headedness.  All other systems reviewed and are negative.  Physical Exam Updated Vital Signs BP (!) 101/56 (BP Location: Left Arm)    Pulse 95    Temp 98.7 F (37.1 C) (Oral)    Resp 16    Ht 1.803 m (5\' 11" )    Wt 94.3 kg    SpO2  99%    BMI 29.01 kg/m  Physical Exam CONSTITUTIONAL: Elderly, uncomfortable appearing HEAD: Normocephalic/atraumatic EYES: EOMI/PERRL, no icterus ENMT: Mask in place NECK: supple no meningeal signs SPINE/BACK:entire spine nontender CV: S1/S2 noted, no murmurs/rubs/gallops noted LUNGS: Lungs are clear to auscultation bilaterally, no apparent distress ABDOMEN: soft, distention noted tenderness noted in the lower quadrants.  Hyperactive bowel sounds in the upper abdomen, hypoactive bowel sounds in the lower abdomen GU:no cva tenderness Testicles descended bilaterally and no  tenderness.  No hernias noted Family at bedside per patient request NEURO: Pt is awake/alert/appropriate, moves all extremitiesx4.  No facial droop.   EXTREMITIES: pulses normal/equal, full ROM SKIN: warm, color normal PSYCH: no abnormalities of mood noted, alert and oriented to situation  ED Results / Procedures / Treatments   Labs (all labs ordered are listed, but only abnormal results are displayed) Labs Reviewed  LIPASE, BLOOD - Abnormal; Notable for the following components:      Result Value   Lipase 54 (*)    All other components within normal limits  COMPREHENSIVE METABOLIC PANEL - Abnormal; Notable for the following components:   Glucose, Bld 140 (*)    Total Bilirubin 2.3 (*)    All other components within normal limits  CBC - Abnormal; Notable for the following components:   WBC 14.0 (*)    All other components within normal limits  RESP PANEL BY RT-PCR (FLU A&B, COVID) ARPGX2  LACTIC ACID, PLASMA    EKG None  Radiology CT ABDOMEN PELVIS W CONTRAST  Result Date: 01/16/2021 CLINICAL DATA:  Diffuse abdominal pain, nausea and vomiting. EXAM: CT ABDOMEN AND PELVIS WITH CONTRAST TECHNIQUE: Multidetector CT imaging of the abdomen and pelvis was performed using the standard protocol following bolus administration of intravenous contrast. CONTRAST:  80mL OMNIPAQUE IOHEXOL 350 MG/ML SOLN COMPARISON:  CTA chest, abdomen and pelvis 02/05/2018 FINDINGS: Lower chest: Stable 4 mm subpleural right lower lobe nodule, series 6 axial 7. On this same image there is a stable 3 mm subpleural right lower lobe nodule posterior medially. Lung bases are clear of infiltrates and further nodules. Interval new TEVAR stenting in the aortic valve plane. The cardiac size is normal. 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 Hepatobiliary: Gallbladder and bile ducts unremarkable. No liver mass is seen. Pancreas: Unremarkable. No pancreatic ductal dilatation or surrounding inflammatory changes.  Spleen: Slightly prominent , 12.8 cm in length with uniform enhancement. Adrenals/Urinary Tract: There is no adrenal mass. Perinephric stranding is similar to prior study with unremarkable renal cortex. There is no hydronephrosis or stone disease. There is mild bladder thickening versus underdistention, with bladder base impression by an enlarged prostate once again. Stomach/Bowel: The appendix is not seen and is suspected surgically absent. Small hiatal hernia. The gastric wall unremarkable with slight gastric fluid distention. There is dilatation of the mid to lower abdominal small bowel segments up to 3.2 cm. The transitional segment is on series 2 axial 72 where a sharply angled distal ileal loop is noted below the level of the cecum suggesting adhesive disease as etiology. There is mucosal enhancement in multiple of the dilated small bowel segments in the lower abdomen, and fluid in the colon without colonic thickening or inflammatory change. No bowel pneumatosis is seen. Vascular/Lymphatic: The abdominal aorta is heavily calcified with moderate patchy calcification continuing into the iliacs. There is no AAA. No lymphadenopathy is seen. Reproductive: Enlarged prostate gland measuring 5 cm impressing on the bladder base. Unremarkable seminal vesicles. Other: Small volume ascites is noted  in the distal paracolic gutters and pelvis, and scattered along the mesenteric folds. There is no mesenteric inflammatory reaction identified. There are small umbilical and inguinal fat hernias. Multiple bilateral pelvic phleboliths. Musculoskeletal: There is osteopenia, degenerative changes of the spine and mild chronic and unchanged anterior wedge compression fractures of the T12 and L2 vertebral bodies. There is mild hip DJD. There is no worrisome regional skeletal lesion IMPRESSION: 1. Low to intermediate grade small bowel obstruction transitioning in the distal ileum at a sharply angulated segment, suggest adhesive disease  as etiology. There is mucosal enhancement in multiple dilated segments which could indicate enteritis or ischemia but there is no pneumatosis, free air or portal venous gas. Surgical consult recommended. 2. Small volume abdominal and pelvic ascites. No focal mesenteric inflammatory reaction/edema. 3. Fluid in the colon also is noted but no wall thickening or inflammatory reaction. 4. Prostatomegaly with bladder base impression mild bladder thickening versus underdistention, similar to the prior study. 5. Interval TEVAR. Heavy aortic and moderate iliac arterial calcific plaques. Coronary artery calcifications. 6. Small hiatal hernia with small umbilical and inguinal fat hernias. 7. Chronic mild compression fractures of T12 and L2. Osteopenia and degenerative change. Electronically Signed   By: Almira Bar M.D.   On: 01/16/2021 23:58    Procedures Procedures    Medications Ordered in ED Medications  ondansetron (ZOFRAN) injection 4 mg (has no administration in time range)  lactated ringers bolus 1,000 mL (has no administration in time range)  ondansetron (ZOFRAN) injection 4 mg (has no administration in time range)  fentaNYL (SUBLIMAZE) injection 50 mcg (has no administration in time range)  iohexol (OMNIPAQUE) 350 MG/ML injection 80 mL (80 mLs Intravenous Contrast Given 01/16/21 2324)    ED Course/ Medical Decision Making/ A&P Clinical Course as of 01/17/21 0248  Thu Jan 17, 2021  0204 Discussed with Dr. Carolynne Edouard with general surgery.  He requests patient is improving and well-appearing, recommends medical admission with NG tube [DW]  0205 WBC(!): 14.0 Leukocytosis noted [DW]    Clinical Course User Index [DW] Zadie Rhine, MD                           Medical Decision Making  This patient presents to the ED for concern of abdominal pain, this involves an extensive number of treatment options, and is a complaint that carries with it a high risk of complications and morbidity.  The  differential diagnosis includes bowel perforation, bowel obstruction, cholecystitis, cholelithiasis, pancreatitis  Comorbidities that complicate the patient evaluation: Patients presentation is complicated by their history of cardiovascular disease    Additional history obtained: Additional history obtained from family  Lab Tests: I Ordered, and personally interpreted labs.  The pertinent results include: Leukocytosis  Imaging Studies ordered: I ordered imaging studies including CT scan abdomen pelvis I independently visualized and interpreted imaging which showed small bowel obstruction I agree with the radiologist interpretation  Cardiac Monitoring: The patient was maintained on a cardiac monitor.  I personally viewed and interpreted the cardiac monitor which showed an underlying rhythm of:  sinus rhythm  Medicines ordered and prescription drug management: I ordered medication including IV fentanyl for pain  Critical Interventions:       Admission to the hospital, ordered NG tube  Consultations Obtained: I requested consultation with the admitting physician Dr. Rachael Darby , and discussed  findings as well as pertinent plan - they recommend: Admission to the hospital  Reevaluation: After the interventions noted  above, I reevaluated the patient and found that they have :stayed the same  Complexity of problems addressed: Patients presentation is most consistent with  acute presentation with potential threat to life or bodily function      Disposition: After consideration of the diagnostic results and the patients response to treatment,  I feel that the patent would benefit from admission .            Final Clinical Impression(s) / ED Diagnoses Final diagnoses:  Small bowel obstruction [facility](HCC)    Rx / DC Orders ED Discharge Orders     None         Zadie RhineWickline, Gwendelyn Lanting, MD 01/17/21 [phone]

## 2021-01-17 NOTE — ED Notes (Signed)
Please complete Covid swab prior to report.  Thank you.

## 2021-01-17 NOTE — Progress Notes (Signed)
PROGRESS NOTE    Jerry Ferguson  CNO:[id number] DOB: 10/15/1945 DOA: 01/16/2021 PCP: Mackie Pai, PA-C  Chief Complaint  Patient presents with   Abdominal Pain    Brief Narrative:  Jerry Ferguson is [name] 76 y.o. male with medical history significant for HTN, severe aortic stenosis status post TAVR, carotid atherosclerosis status post right carotid endarterectomy who presents for evaluation of abdominal pain.  He reports abdominal pain that started yesterday morning when he was working on his car with his son-in-law.  He thought he just strained some abdominal muscles but over the next 24 hours pain progressively worsened to the point where he became very nauseous and vomited 3 times just before EMS came to transport him.  He reports after he vomited he felt like his abdominal pain was slightly better and cerumen had increased pain again and was given IV pain medication which helped.  Abdominal pain is worsened by movement and changing of body position and improved after IV pain medication and for Demauri Advincula short time after vomiting.  He does not have any fever.  Denies any chest pain, palpitations, urinary symptoms, diarrhea.  There is no blood in the vomit.  He has never had this type of pain before.  He had an appendectomy over 50 years ago but no other abdominal surgeries. Is Jerry Ferguson former smoker but quit over 25 years ago.  Denies alcohol or illicit drug use.   ED Course: Hemodynamically stable in the emergency room.  Afebrile in the emergency room.  CT of the abdomen pelvis showed small bowel obstruction and distal ileum consistent with adhesion.  Lab work showed WBC 14,000 hemoglobin 15.4 hematocrit 46.2 platelets 258,000, odium 138 potassium 4.2 chloride 104 bicarb 24 creatinine 1.14 BUN 14 glucose 140 lipase 54 alk phos Jerry Ferguson 66 AST 23 ALT 21 bilirubin 2.3.  Surgery was consulted by the ER physician.  NG tube to low intermittent wall suction was ordered.  Hospitalist service was asked to admit for further  management    Assessment & Plan:   Principal Problem:   SBO (small bowel obstruction) (HCC) Active Problems:   Essential hypertension   Leukocytosis   S/P TAVR (transcatheter aortic valve replacement)   * SBO (small bowel obstruction) (HCC)- (present on admission) CT findings show Low to intermediate grade SBO transitioning in the distal ileum, ? Adhesive disease, ? Enteritis or ischemia.  Small volume abdominal/pelvic ascites. Surgery c/s, appreciate recs NG in place SBO protocol  Essential hypertension- (present on admission) Lisinopril on hold  Leukocytosis Suspect reactive  S/P TAVR (transcatheter aortic valve replacement) noted   DVT prophylaxis: scd Code Status: full Family Communication: family at bedside Disposition:   Status is: Inpatient  Remains inpatient appropriate because: surgery c/s, resolution of SBO       Consultants:  surgery  Procedures:  none  Antimicrobials:  Anti-infectives (From admission, onward)    None       Subjective: No new complaints  Objective: Vitals:   01/17/21 0439 01/17/21 0935 01/17/21 1244 01/17/21 1745  BP: (!) 150/80 135/60 (!) 146/58 (!) 151/73  Pulse: 76 64 66 66  Resp: '17 16 16 17  ' Temp: 98.5 F (36.9 C) 97.7 F (36.5 C) 97.9 F (36.6 C) 98 F (36.7 C)  TempSrc: Oral     SpO2: 100% 99% 97% 99%  Weight:      Height:        Intake/Output Summary (Last 24 hours) at 01/17/2021 1955 Last data filed at 01/17/2021 1147 Gross  per 24 hour  Intake 90 ml  Output --  Net 90 ml   Filed Weights   01/16/21 2227  Weight: 94.3 kg    Examination:  General exam: Appears calm and comfortable  Respiratory system: Clear to auscultation. Respiratory effort normal. Cardiovascular system: RRR Gastrointestinal system: mildly distended, soft and nontender. Central nervous system: Alert and oriented. No focal neurological deficits. Extremities: no LEE Skin: No rashes, lesions or ulcers Psychiatry: Judgement  and insight appear normal. Mood & affect appropriate.     Data Reviewed: I have personally reviewed following labs and imaging studies  CBC: Recent Labs  Lab 01/16/21 2228 01/17/21 0538  WBC 14.0* 9.5  HGB 15.4 13.3  HCT 46.2 39.8  MCV 87.8 88.6  PLT 258 974    Basic Metabolic Panel: Recent Labs  Lab 01/16/21 2228 01/17/21 0538  NA 138 139  K 4.2 4.1  CL 104 103  CO2 24 26  GLUCOSE 140* 110*  BUN 14 19  CREATININE 1.14 1.11  CALCIUM 9.9 9.1    GFR: Estimated Creatinine Clearance: 67.4 mL/min (by C-G formula based on SCr of 1.11 mg/dL).  Liver Function Tests: Recent Labs  Lab 01/16/21 2228  AST 23  ALT 21  ALKPHOS 66  BILITOT 2.3*  PROT 7.8  ALBUMIN 4.7    CBG: No results for input(s): GLUCAP in the last 168 hours.   Recent Results (from the past 240 hour(s))  Resp Panel by RT-PCR (Flu Dewain Platz&B, Covid) Nasopharyngeal Swab     Status: None   Collection Time: 01/17/21  3:30 AM   Specimen: Nasopharyngeal Swab; Nasopharyngeal(NP) swabs in vial transport medium  Result Value Ref Range Status   SARS Coronavirus 2 by RT PCR NEGATIVE NEGATIVE Final    Comment: (NOTE) SARS-CoV-2 target nucleic acids are NOT DETECTED.  The SARS-CoV-2 RNA is generally detectable in upper respiratory specimens during the acute phase of infection. The lowest concentration of SARS-CoV-2 viral copies this assay can detect is 138 copies/mL. Edom Schmuhl negative result does not preclude SARS-Cov-2 infection and should not be used as the sole basis for treatment or other patient management decisions. Yeudiel Mateo negative result may occur with  improper specimen collection/handling, submission of specimen other than nasopharyngeal swab, presence of viral mutation(s) within the areas targeted by this assay, and inadequate number of viral copies(<138 copies/mL). Shoua Ulloa negative result must be combined with clinical observations, patient history, and epidemiological information. The expected result is  Negative.  Fact Sheet for Patients:  EntrepreneurPulse.com.au  Fact Sheet for Healthcare Providers:  IncredibleEmployment.be  This test is no t yet approved or cleared by the [place] FDA and  has been authorized for detection and/or diagnosis of SARS-CoV-2 by FDA under an Emergency Use Authorization (EUA). This EUA will remain  in effect (meaning this test can be used) for the duration of the COVID-19 declaration under Section 564(b)(1) of the Act, 21 U.S.C.section 360bbb-3(b)(1), unless the authorization is terminated  or revoked sooner.       Influenza Eleazar Kimmey by PCR NEGATIVE NEGATIVE Final   Influenza B by PCR NEGATIVE NEGATIVE Final    Comment: (NOTE) The Xpert Xpress SARS-CoV-2/FLU/RSV plus assay is intended as an aid in the diagnosis of influenza from Nasopharyngeal swab specimens and should not be used as Carin Shipp sole basis for treatment. Nasal washings and aspirates are unacceptable for Xpert Xpress SARS-CoV-2/FLU/RSV testing.  Fact Sheet for Patients: EntrepreneurPulse.com.au  Fact Sheet for Healthcare Providers: IncredibleEmployment.be  This test is not yet approved or cleared by the  [place] Architectural technologist and has been authorized for detection and/or diagnosis of SARS-CoV-2 by FDA under an Print production planner (EUA). This EUA will remain in effect (meaning this test can be used) for the duration of the COVID-19 declaration under Section 564(b)(1) of the Act, 21 U.S.C. section 360bbb-3(b)(1), unless the authorization is terminated or revoked.  Performed at [facility], [place] [address], [place], [place] [address]          Radiology Studies: DG Abdomen 1 View  Result Date: 01/17/2021 CLINICAL DATA:  NG G-tube placement EXAM: ABDOMEN - 1 VIEW COMPARISON:  CT 01/16/2021 FINDINGS: NG tube tip is in the distal stomach. Gas within mildly prominent central and right lower  quadrant small bowel loops. IMPRESSION: NG tube tip in the distal stomach. Electronically Signed   By: Rolm Baptise M.D.   On: 01/17/2021 03:50   CT ABDOMEN PELVIS W CONTRAST  Result Date: 01/16/2021 CLINICAL DATA:  Diffuse abdominal pain, nausea and vomiting. EXAM: CT ABDOMEN AND PELVIS WITH CONTRAST TECHNIQUE: Multidetector CT imaging of the abdomen and pelvis was performed using the standard protocol following bolus administration of intravenous contrast. CONTRAST:  48m OMNIPAQUE IOHEXOL 350 MG/ML SOLN COMPARISON:  CTA chest, abdomen and pelvis 02/05/2018 FINDINGS: Lower chest: Stable 4 mm subpleural right lower lobe nodule, series 6 axial 7. On this same image there is Hutchinson Isenberg stable 3 mm subpleural right lower lobe nodule posterior medially. Lung bases are clear of infiltrates and further nodules. Interval new TEVAR stenting in the aortic valve plane. The cardiac size is normal. 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 Hepatobiliary: Gallbladder and bile ducts unremarkable. No liver mass is seen. Pancreas: Unremarkable. No pancreatic ductal dilatation or surrounding inflammatory changes. Spleen: Slightly prominent , 12.8 cm in length with uniform enhancement. Adrenals/Urinary Tract: There is no adrenal mass. Perinephric stranding is similar to prior study with unremarkable renal cortex. There is no hydronephrosis or stone disease. There is mild bladder thickening versus underdistention, with bladder base impression by an enlarged prostate once again. Stomach/Bowel: The appendix is not seen and is suspected surgically absent. Small hiatal hernia. The gastric wall unremarkable with slight gastric fluid distention. There is dilatation of the mid to lower abdominal small bowel segments up to 3.2 cm. The transitional segment is on series 2 axial 72 where Camillia Marcy sharply angled distal ileal loop is noted below the level of the cecum suggesting adhesive disease as etiology. There is mucosal enhancement in  multiple of the dilated small bowel segments in the lower abdomen, and fluid in the colon without colonic thickening or inflammatory change. No bowel pneumatosis is seen. Vascular/Lymphatic: The abdominal aorta is heavily calcified with moderate patchy calcification continuing into the iliacs. There is no AAA. No lymphadenopathy is seen. Reproductive: Enlarged prostate gland measuring 5 cm impressing on the bladder base. Unremarkable seminal vesicles. Other: Small volume ascites is noted in the distal paracolic gutters and pelvis, and scattered along the mesenteric folds. There is no mesenteric inflammatory reaction identified. There are small umbilical and inguinal fat hernias. Multiple bilateral pelvic phleboliths. Musculoskeletal: There is osteopenia, degenerative changes of the spine and mild chronic and unchanged anterior wedge compression fractures of the T12 and L2 vertebral bodies. There is mild hip DJD. There is no worrisome regional skeletal lesion IMPRESSION: 1. Low to intermediate grade small bowel obstruction transitioning in the distal ileum at Jeanine Caven sharply angulated segment, suggest adhesive disease as etiology. There is mucosal enhancement in multiple dilated segments which could indicate enteritis  or ischemia but there is no pneumatosis, free air or portal venous gas. Surgical consult recommended. 2. Small volume abdominal and pelvic ascites. No focal mesenteric inflammatory reaction/edema. 3. Fluid in the colon also is noted but no wall thickening or inflammatory reaction. 4. Prostatomegaly with bladder base impression mild bladder thickening versus underdistention, similar to the prior study. 5. Interval TEVAR. Heavy aortic and moderate iliac arterial calcific plaques. Coronary artery calcifications. 6. Small hiatal hernia with small umbilical and inguinal fat hernias. 7. Chronic mild compression fractures of T12 and L2. Osteopenia and degenerative change. Electronically Signed   By: Telford Nab  M.D.   On: 01/16/2021 23:58        Scheduled Meds: Continuous Infusions:  lactated ringers 100 mL/hr at 01/17/21 1611     LOS: 0 days    Time spent: over 30 min    Fayrene Helper, MD [facility]   To contact the attending provider between 7A-7P or the covering provider during after hours 7P-7A, please log into the web site [email or web address] and access using universal [place] password for that web site. If you do not have the password, please call the hospital operator.  01/17/2021, 7:55 PM

## 2021-01-18 ENCOUNTER — Inpatient Hospital Stay (HOSPITAL_COMMUNITY): Payer: PPO

## 2021-01-18 LAB — CBC
HCT: 38.2 % — ABNORMAL LOW (ref 39.0–52.0)
Hemoglobin: 12.6 g/dL — ABNORMAL LOW (ref 13.0–17.0)
MCH: 29.9 pg (ref 26.0–34.0)
MCHC: 33 g/dL (ref 30.0–36.0)
MCV: 90.7 fL (ref 80.0–100.0)
Platelets: 156 10*3/uL (ref 150–400)
RBC: 4.21 MIL/uL — ABNORMAL LOW (ref 4.22–5.81)
RDW: 13.4 % (ref 11.5–15.5)
WBC: 8.5 10*3/uL (ref 4.0–10.5)
nRBC: 0 % (ref 0.0–0.2)

## 2021-01-18 LAB — BASIC METABOLIC PANEL
Anion gap: 8 (ref 5–15)
BUN: 18 mg/dL (ref 8–23)
CO2: 25 mmol/L (ref 22–32)
Calcium: 8.7 mg/dL — ABNORMAL LOW (ref 8.9–10.3)
Chloride: 107 mmol/L (ref 98–111)
Creatinine, Ser: 0.98 mg/dL (ref 0.61–1.24)
GFR, Estimated: >60 mL/min (ref >60)
Glucose, Bld: 92 mg/dL (ref 70–99)
Potassium: 3.9 mmol/L (ref 3.5–5.1)
Sodium: 140 mmol/L (ref 135–145)

## 2021-01-18 LAB — PHOSPHORUS: Phosphorus: 3.9 mg/dL (ref 2.5–4.6)

## 2021-01-18 LAB — MAGNESIUM: Magnesium: 2.3 mg/dL (ref 1.7–2.4)

## 2021-01-18 MED ORDER — ATORVASTATIN CALCIUM 20 MG PO TABS
20.0000 mg | ORAL_TABLET | Freq: Every day | ORAL | Status: DC
  Administered 2021-01-18 – 2021-01-19 (×2): 20 mg via ORAL
  Filled 2021-01-18 (×2): qty 1

## 2021-01-18 MED ORDER — LISINOPRIL 5 MG PO TABS
5.0000 mg | ORAL_TABLET | Freq: Every day | ORAL | Status: DC
  Administered 2021-01-18 – 2021-01-19 (×2): 5 mg via ORAL
  Filled 2021-01-18 (×2): qty 1

## 2021-01-18 MED ORDER — ENOXAPARIN SODIUM 40 MG/0.4ML IJ SOSY
40.0000 mg | PREFILLED_SYRINGE | INTRAMUSCULAR | Status: DC
  Filled 2021-01-18: qty 0.4

## 2021-01-18 MED ORDER — ASPIRIN EC 81 MG PO TBEC
81.0000 mg | DELAYED_RELEASE_TABLET | Freq: Every day | ORAL | Status: DC
  Administered 2021-01-18 – 2021-01-19 (×2): 81 mg via ORAL
  Filled 2021-01-18 (×2): qty 1

## 2021-01-18 NOTE — Progress Notes (Signed)
Transition of Care [facility]) Screening Note  Patient Details  Name: Jerry Ferguson Date of Birth: Oct 25, 1945  Transition of Care [facility]) CM/SW Contact:    Sherie Don, LCSW Phone Number: 01/18/2021, 10:35 AM  Transition of Care Department [facility]) has reviewed patient and no TOC needs have been identified at this time. We will continue to monitor patient advancement through interdisciplinary progression rounds. If new patient transition needs arise, please place a TOC consult.

## 2021-01-18 NOTE — Progress Notes (Signed)
Progress Note     Subjective: Pt reports having some bowel function yesterday. Wants NGT out. Denies abdominal pain.   Objective: Vital signs in last 24 hours: Temp:  [97.7 F (36.5 C)-98.2 F (36.8 C)] 98.2 F (36.8 C) (01/13 1005) Pulse Rate:  [66-81] 74 (01/13 1005) Resp:  [16-18] 18 (01/13 1005) BP: (144-166)/(58-73) 144/67 (01/13 1005) SpO2:  [92 %-99 %] 97 % (01/13 1005) Last BM Date: 01/17/21  Intake/Output from previous day: 01/12 0701 - 01/13 0700 In: 726.6 [I.V.:636.6; NG/GT:90] Out: 850 [Emesis/NG output:850] Intake/Output this shift: No intake/output data recorded.  PE: General: pleasant, WD, WN male who is laying in bed in NAD Heart: regular, rate, and rhythm.  Lungs: CTAB, no wheezes, rhonchi, or rales noted.  Respiratory effort nonlabored Abd: soft, NT, ND, +BS, NGT with bilious drainage  MS: all 4 extremities are symmetrical with no cyanosis, clubbing, or edema. Skin: warm and dry with no masses, lesions, or rashes Neuro: Cranial nerves 2-12 grossly intact, sensation is normal throughout Psych: A&Ox3 with an appropriate affect.    Lab Results:  Recent Labs    01/17/21 0538 01/18/21 0319  WBC 9.5 8.5  HGB 13.3 12.6*  HCT 39.8 38.2*  PLT 179 156   BMET Recent Labs    01/17/21 0538 01/18/21 0319  NA 139 140  K 4.1 3.9  CL 103 107  CO2 26 25  GLUCOSE 110* 92  BUN 19 18  CREATININE 1.11 0.98  CALCIUM 9.1 8.7*   PT/INR No results for input(s): LABPROT, INR in the last 72 hours. CMP     Component Value Date/Time   NA 140 01/18/2021 0319   NA 143 01/26/2019 0733   K 3.9 01/18/2021 0319   CL 107 01/18/2021 0319   CO2 25 01/18/2021 0319   GLUCOSE 92 01/18/2021 0319   BUN 18 01/18/2021 0319   BUN 18 01/26/2019 0733   CREATININE 0.98 01/18/2021 0319   CREATININE 0.97 11/04/2013 1038   CALCIUM 8.7 (L) 01/18/2021 0319   PROT 7.8 01/16/2021 2228   PROT 6.7 01/26/2019 0733   ALBUMIN 4.7 01/16/2021 2228   ALBUMIN 4.7 01/26/2019 0733    AST 23 01/16/2021 2228   ALT 21 01/16/2021 2228   ALKPHOS 66 01/16/2021 2228   BILITOT 2.3 (H) 01/16/2021 2228   BILITOT 0.9 01/26/2019 0733   GFRNONAA >60 01/18/2021 0319   GFRNONAA 80 11/04/2013 1038   GFRAA >60 09/20/2019 0340   GFRAA >89 11/04/2013 1038   Lipase     Component Value Date/Time   LIPASE 54 (H) 01/16/2021 2228       Studies/Results: DG Abdomen 1 View  Result Date: 01/17/2021 CLINICAL DATA:  NG G-tube placement EXAM: ABDOMEN - 1 VIEW COMPARISON:  CT 01/16/2021 FINDINGS: NG tube tip is in the distal stomach. Gas within mildly prominent central and right lower quadrant small bowel loops. IMPRESSION: NG tube tip in the distal stomach. Electronically Signed   By: Charlett NoseKevin  Dover M.D.   On: 01/17/2021 03:50   CT ABDOMEN PELVIS W CONTRAST  Result Date: 01/16/2021 CLINICAL DATA:  Diffuse abdominal pain, nausea and vomiting. EXAM: CT ABDOMEN AND PELVIS WITH CONTRAST TECHNIQUE: Multidetector CT imaging of the abdomen and pelvis was performed using the standard protocol following bolus administration of intravenous contrast. CONTRAST:  80mL OMNIPAQUE IOHEXOL 350 MG/ML SOLN COMPARISON:  CTA chest, abdomen and pelvis 02/05/2018 FINDINGS: Lower chest: Stable 4 mm subpleural right lower lobe nodule, series 6 axial 7. On this same image there is  a stable 3 mm subpleural right lower lobe nodule posterior medially. Lung bases are clear of infiltrates and further nodules. Interval new TEVAR stenting in the aortic valve plane. The cardiac size is normal. 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 Hepatobiliary: Gallbladder and bile ducts unremarkable. No liver mass is seen. Pancreas: Unremarkable. No pancreatic ductal dilatation or surrounding inflammatory changes. Spleen: Slightly prominent , 12.8 cm in length with uniform enhancement. Adrenals/Urinary Tract: There is no adrenal mass. Perinephric stranding is similar to prior study with unremarkable renal cortex. There is no  hydronephrosis or stone disease. There is mild bladder thickening versus underdistention, with bladder base impression by an enlarged prostate once again. Stomach/Bowel: The appendix is not seen and is suspected surgically absent. Small hiatal hernia. The gastric wall unremarkable with slight gastric fluid distention. There is dilatation of the mid to lower abdominal small bowel segments up to 3.2 cm. The transitional segment is on series 2 axial 72 where a sharply angled distal ileal loop is noted below the level of the cecum suggesting adhesive disease as etiology. There is mucosal enhancement in multiple of the dilated small bowel segments in the lower abdomen, and fluid in the colon without colonic thickening or inflammatory change. No bowel pneumatosis is seen. Vascular/Lymphatic: The abdominal aorta is heavily calcified with moderate patchy calcification continuing into the iliacs. There is no AAA. No lymphadenopathy is seen. Reproductive: Enlarged prostate gland measuring 5 cm impressing on the bladder base. Unremarkable seminal vesicles. Other: Small volume ascites is noted in the distal paracolic gutters and pelvis, and scattered along the mesenteric folds. There is no mesenteric inflammatory reaction identified. There are small umbilical and inguinal fat hernias. Multiple bilateral pelvic phleboliths. Musculoskeletal: There is osteopenia, degenerative changes of the spine and mild chronic and unchanged anterior wedge compression fractures of the T12 and L2 vertebral bodies. There is mild hip DJD. There is no worrisome regional skeletal lesion IMPRESSION: 1. Low to intermediate grade small bowel obstruction transitioning in the distal ileum at a sharply angulated segment, suggest adhesive disease as etiology. There is mucosal enhancement in multiple dilated segments which could indicate enteritis or ischemia but there is no pneumatosis, free air or portal venous gas. Surgical consult recommended. 2. Small  volume abdominal and pelvic ascites. No focal mesenteric inflammatory reaction/edema. 3. Fluid in the colon also is noted but no wall thickening or inflammatory reaction. 4. Prostatomegaly with bladder base impression mild bladder thickening versus underdistention, similar to the prior study. 5. Interval TEVAR. Heavy aortic and moderate iliac arterial calcific plaques. Coronary artery calcifications. 6. Small hiatal hernia with small umbilical and inguinal fat hernias. 7. Chronic mild compression fractures of T12 and L2. Osteopenia and degenerative change. Electronically Signed   By: Almira Bar M.D.   On: 01/16/2021 23:58   DG Abd Portable 1V-Small Bowel Obstruction Protocol-initial, 8 hr delay  Result Date: 01/17/2021 CLINICAL DATA:  8 hour delay for small bowel obstruction. EXAM: PORTABLE ABDOMEN - 1 VIEW COMPARISON:  Abdominal x-ray 01/17/2021. CT abdomen and pelvis 01/16/2021. FINDINGS: Nasogastric tube tip is at the level of the distal stomach, unchanged. Oral contrast is seen throughout the colon to level of the distal sigmoid colon. No dilated bowel loops are visualized. Phleboliths are noted in the pelvis. Catheter overlies the right pelvis. IMPRESSION: 1. Nonobstructive bowel gas pattern. Oral contrast reaches the sigmoid colon. 2. Enteric tube tip at the level of the distal stomach. Electronically Signed   By: Darliss Cheney M.D.   On: 01/17/2021  20:02    Anti-infectives: Anti-infectives (From admission, onward)    None        Assessment/Plan SBO, likely adhesive - history of open appendectomy - CT 1/11 w/ mall bowel obstruction transitioning in the distal ileum with mucosal enhancement possibly suggestive of enteritis or ischemia but no pneumatosis, free air, or portal venous gas.  Small hiatal hernia, small umbilical and inguinal fat-containing hernias. - continue NGT for decompression and keep NPO - NGT output 850 cc of thin bilious drainage - 8h delay film with contrast  throughout colon and patient having bowel movements - Keep K > 4 and Mg > 2 for bowel function - Mobilize for bowel function - no indication for surgical intervention at this time  - removed NGT and starting CLD. Ok to ADAT to FLD today. Likely advance to soft in AM if tolerating and could discharge as early as tomorrow if tolerating soft diet    FEN: CLD, IVF per [facility] ID: none indicated VTE: okay for chemical prophylaxis from surgical standpoint  LOS: 1 day   I reviewed hospitalist notes, last 24 h vitals and pain scores, last 48 h intake and output, last 24 h labs and trends, and last 24 h imaging results.  This care required moderate level of medical decision making.    Juliet Rude, [facility] Surgery 01/18/2021, 11:22 AM Please see Amion for pager number during day hours 7:00am-4:30pm

## 2021-01-18 NOTE — Progress Notes (Signed)
PROGRESS NOTE    Jerry Ferguson  PCH:[id number] DOB: 1945/03/31 DOA: 01/16/2021 PCP: Mackie Pai, PA-C  Chief Complaint  Patient presents with   Abdominal Pain    Brief Narrative:  Jerry Ferguson is Jerry Ferguson 76 y.o. male with medical history significant for HTN, severe aortic stenosis status post TAVR, carotid atherosclerosis status post right carotid endarterectomy who presents for evaluation of abdominal pain.  He reports abdominal pain that started yesterday morning when he was working on his car with his son-in-law.  He thought he just strained some abdominal muscles but over the next 24 hours pain progressively worsened to the point where he became very nauseous and vomited 3 times just before EMS came to transport him.  He reports after he vomited he felt like his abdominal pain was slightly better and cerumen had increased pain again and was given IV pain medication which helped.  Abdominal pain is worsened by movement and changing of body position and improved after IV pain medication and for Jerry Ferguson short time after vomiting.  He does not have any fever.  Denies any chest pain, palpitations, urinary symptoms, diarrhea.  There is no blood in the vomit.  He has never had this type of pain before.  He had an appendectomy over 50 years ago but no other abdominal surgeries. Is Jerry Ferguson former smoker but quit over 25 years ago.  Denies alcohol or illicit drug use.   ED Course: Hemodynamically stable in the emergency room.  Afebrile in the emergency room.  CT of the abdomen pelvis showed small bowel obstruction and distal ileum consistent with adhesion.  Lab work showed WBC 14,000 hemoglobin 15.4 hematocrit 46.2 platelets 258,000, odium 138 potassium 4.2 chloride 104 bicarb 24 creatinine 1.14 BUN 14 glucose 140 lipase 54 alk phos Jerry Ferguson 66 AST 23 ALT 21 bilirubin 2.3.  Surgery was consulted by the ER physician.  NG tube to low intermittent wall suction was ordered.  Hospitalist service was asked to admit for further  management    Assessment & Plan:   Principal Problem:   SBO (small bowel obstruction) (HCC) Active Problems:   Essential hypertension   Leukocytosis   S/P TAVR (transcatheter aortic valve replacement)   * SBO (small bowel obstruction) (HCC)- (present on admission) CT findings show Low to intermediate grade SBO transitioning in the distal ileum, ? Adhesive disease, ? Enteritis or ischemia.  Small volume abdominal/pelvic ascites. KUB without evidence of SBO Surgery c/s, appreciate recs - CLD, ADAT -> if tolerates soft in AM, consider discharge  NG in place SBO protocol  Essential hypertension- (present on admission) Lisinopril   Leukocytosis Suspect reactive  S/P TAVR (transcatheter aortic valve replacement) Noted aspirin   DVT prophylaxis: scd Code Status: full Family Communication: family at bedside Disposition:   Status is: Inpatient  Remains inpatient appropriate because: surgery c/s, resolution of SBO       Consultants:  surgery  Procedures:  none  Antimicrobials:  Anti-infectives (From admission, onward)    None       Subjective: No new complaints  Objective: Vitals:   01/18/21 0151 01/18/21 0533 01/18/21 1005 01/18/21 1339  BP: (!) 151/64 (!) 152/58 (!) 144/67 125/70  Pulse: 71 74 74 71  Resp: _0 Temp: 98 F (36.7 C) 97.8 F (36.6 C) 98.2 F (36.8 C) 98.2 F (36.8 C)  TempSrc: Oral Oral Oral Oral  SpO2: 95% 92% 97% 99%  Weight:      Height:  Intake/Output Summary (Last 24 hours) at 01/18/2021 1854 Last data filed at 01/18/2021 1400 Gross per 24 hour  Intake 1356.62 ml  Output 350 ml  Net 1006.62 ml   Filed Weights   01/16/21 2227  Weight: 94.3 kg    Examination:  General: No acute distress. Cardiovascular: RRR Lungs: unlabored Abdomen: Soft, nontender, nondistended  Neurological: Alert and oriented 3. Moves all extremities 4. Cranial nerves II through XII grossly intact. Skin: Warm and dry. No rashes  or lesions. Extremities: No clubbing or cyanosis. No edema.    Data Reviewed: I have personally reviewed following labs and imaging studies  CBC: Recent Labs  Lab 01/16/21 2228 01/17/21 0538 01/18/21 0319  WBC 14.0* 9.5 8.5  HGB 15.4 13.3 12.6*  HCT 46.2 39.8 38.2*  MCV 87.8 88.6 90.7  PLT 258 179 919    Basic Metabolic Panel: Recent Labs  Lab 01/16/21 2228 01/17/21 0538 01/18/21 0319  NA 138 139 140  K 4.2 4.1 3.9  CL 104 103 107  CO2 _0 GLUCOSE 140* 110* 92  BUN _1 CREATININE 1.14 1.11 0.98  CALCIUM 9.9 9.1 8.7*  MG  --   --  2.3  PHOS  --   --  3.9    GFR: Estimated Creatinine Clearance: 76.4 mL/min (by C-G formula based on SCr of 0.98 mg/dL).  Liver Function Tests: Recent Labs  Lab 01/16/21 2228  AST 23  ALT 21  ALKPHOS 66  BILITOT 2.3*  PROT 7.8  ALBUMIN 4.7    CBG: No results for input(s): GLUCAP in the last 168 hours.   Recent Results (from the past 240 hour(s))  Resp Panel by RT-PCR (Flu Jerry Ferguson&B, Covid) Nasopharyngeal Swab     Status: None   Collection Time: 01/17/21  3:30 AM   Specimen: Nasopharyngeal Swab; Nasopharyngeal(NP) swabs in vial transport medium  Result Value Ref Range Status   SARS Coronavirus 2 by RT PCR NEGATIVE NEGATIVE Final    Comment: (NOTE) SARS-CoV-2 target nucleic acids are NOT DETECTED.  The SARS-CoV-2 RNA is generally detectable in upper respiratory specimens during the acute phase of infection. The lowest concentration of SARS-CoV-2 viral copies this assay can detect is 138 copies/mL. Jerry Ferguson negative result does not preclude SARS-Cov-2 infection and should not be used as the sole basis for treatment or other patient management decisions. Jerry Ferguson negative result may occur with  improper specimen collection/handling, submission of specimen other than nasopharyngeal swab, presence of viral mutation(s) within the areas targeted by this assay, and inadequate number of viral copies(<138 copies/mL). Jerry Ferguson negative result  must be combined with clinical observations, patient history, and epidemiological information. The expected result is Negative.  Fact Sheet for Patients:  EntrepreneurPulse.com.au  Fact Sheet for Healthcare Providers:  IncredibleEmployment.be  This test is no t yet approved or cleared by the [place] FDA and  has been authorized for detection and/or diagnosis of SARS-CoV-2 by FDA under an Emergency Use Authorization (EUA). This EUA will remain  in effect (meaning this test can be used) for the duration of the COVID-19 declaration under Section 564(b)(1) of the Act, 21 U.S.C.section 360bbb-3(b)(1), unless the authorization is terminated  or revoked sooner.       Influenza [place] by PCR NEGATIVE NEGATIVE Final   Influenza B by PCR NEGATIVE NEGATIVE Final    Comment: (NOTE) The Xpert Xpress SARS-CoV-2/FLU/RSV plus assay is intended as an aid in the diagnosis of influenza from Nasopharyngeal swab specimens and should not be used as  Gabrille Kilbride sole basis for treatment. Nasal washings and aspirates are unacceptable for Xpert Xpress SARS-CoV-2/FLU/RSV testing.  Fact Sheet for Patients: EntrepreneurPulse.com.au  Fact Sheet for Healthcare Providers: IncredibleEmployment.be  This test is not yet approved or cleared by the [place] FDA and has been authorized for detection and/or diagnosis of SARS-CoV-2 by FDA under an Emergency Use Authorization (EUA). This EUA will remain in effect (meaning this test can be used) for the duration of the COVID-19 declaration under Section 564(b)(1) of the Act, 21 U.S.C. section 360bbb-3(b)(1), unless the authorization is terminated or revoked.  Performed at [facility], [place] [address], [place], [place] [address]          Radiology Studies: DG Abdomen 1 View  Result Date: 01/17/2021 CLINICAL DATA:  NG G-tube placement EXAM: ABDOMEN - 1 VIEW COMPARISON:   CT 01/16/2021 FINDINGS: NG tube tip is in the distal stomach. Gas within mildly prominent central and right lower quadrant small bowel loops. IMPRESSION: NG tube tip in the distal stomach. Electronically Signed   By: Rolm Baptise M.D.   On: 01/17/2021 03:50   CT ABDOMEN PELVIS W CONTRAST  Result Date: 01/16/2021 CLINICAL DATA:  Diffuse abdominal pain, nausea and vomiting. EXAM: CT ABDOMEN AND PELVIS WITH CONTRAST TECHNIQUE: Multidetector CT imaging of the abdomen and pelvis was performed using the standard protocol following bolus administration of intravenous contrast. CONTRAST:  57m OMNIPAQUE IOHEXOL 350 MG/ML SOLN COMPARISON:  CTA chest, abdomen and pelvis 02/05/2018 FINDINGS: Lower chest: Stable 4 mm subpleural right lower lobe nodule, series 6 axial 7. On this same image there is Shewanda Sharpe stable 3 mm subpleural right lower lobe nodule posterior medially. Lung bases are clear of infiltrates and further nodules. Interval new TEVAR stenting in the aortic valve plane. The cardiac size is normal. 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 Hepatobiliary: Gallbladder and bile ducts unremarkable. No liver mass is seen. Pancreas: Unremarkable. No pancreatic ductal dilatation or surrounding inflammatory changes. Spleen: Slightly prominent , 12.8 cm in length with uniform enhancement. Adrenals/Urinary Tract: There is no adrenal mass. Perinephric stranding is similar to prior study with unremarkable renal cortex. There is no hydronephrosis or stone disease. There is mild bladder thickening versus underdistention, with bladder base impression by an enlarged prostate once again. Stomach/Bowel: The appendix is not seen and is suspected surgically absent. Small hiatal hernia. The gastric wall unremarkable with slight gastric fluid distention. There is dilatation of the mid to lower abdominal small bowel segments up to 3.2 cm. The transitional segment is on series 2 axial 72 where Xzavian Semmel sharply angled distal ileal loop is  noted below the level of the cecum suggesting adhesive disease as etiology. There is mucosal enhancement in multiple of the dilated small bowel segments in the lower abdomen, and fluid in the colon without colonic thickening or inflammatory change. No bowel pneumatosis is seen. Vascular/Lymphatic: The abdominal aorta is heavily calcified with moderate patchy calcification continuing into the iliacs. There is no AAA. No lymphadenopathy is seen. Reproductive: Enlarged prostate gland measuring 5 cm impressing on the bladder base. Unremarkable seminal vesicles. Other: Small volume ascites is noted in the distal paracolic gutters and pelvis, and scattered along the mesenteric folds. There is no mesenteric inflammatory reaction identified. There are small umbilical and inguinal fat hernias. Multiple bilateral pelvic phleboliths. Musculoskeletal: There is osteopenia, degenerative changes of the spine and mild chronic and unchanged anterior wedge compression fractures of the T12 and L2 vertebral bodies. There is mild hip DJD. There is no  worrisome regional skeletal lesion IMPRESSION: 1. Low to intermediate grade small bowel obstruction transitioning in the distal ileum at Gust Eugene sharply angulated segment, suggest adhesive disease as etiology. There is mucosal enhancement in multiple dilated segments which could indicate enteritis or ischemia but there is no pneumatosis, free air or portal venous gas. Surgical consult recommended. 2. Small volume abdominal and pelvic ascites. No focal mesenteric inflammatory reaction/edema. 3. Fluid in the colon also is noted but no wall thickening or inflammatory reaction. 4. Prostatomegaly with bladder base impression mild bladder thickening versus underdistention, similar to the prior study. 5. Interval TEVAR. Heavy aortic and moderate iliac arterial calcific plaques. Coronary artery calcifications. 6. Small hiatal hernia with small umbilical and inguinal fat hernias. 7. Chronic mild  compression fractures of T12 and L2. Osteopenia and degenerative change. Electronically Signed   By: Telford Nab M.D.   On: 01/16/2021 23:58   DG Abd Portable 1V-Small Bowel Obstruction Protocol-24 hr delay  Result Date: 01/18/2021 CLINICAL DATA:  Small-bowel obstruction EXAM: PORTABLE ABDOMEN - 1 VIEW COMPARISON:  01/17/2021 FINDINGS: Enteric tube is no longer present. Remains contrast within the colon. There are no dilated loops of small bowel. IMPRESSION: Residual contrast within the colon. No evidence of small-bowel obstruction. Electronically Signed   By: Macy Mis M.D.   On: 01/18/2021 14:31   DG Abd Portable 1V-Small Bowel Obstruction Protocol-initial, 8 hr delay  Result Date: 01/17/2021 CLINICAL DATA:  8 hour delay for small bowel obstruction. EXAM: PORTABLE ABDOMEN - 1 VIEW COMPARISON:  Abdominal x-ray 01/17/2021. CT abdomen and pelvis 01/16/2021. FINDINGS: Nasogastric tube tip is at the level of the distal stomach, unchanged. Oral contrast is seen throughout the colon to level of the distal sigmoid colon. No dilated bowel loops are visualized. Phleboliths are noted in the pelvis. Catheter overlies the right pelvis. IMPRESSION: 1. Nonobstructive bowel gas pattern. Oral contrast reaches the sigmoid colon. 2. Enteric tube tip at the level of the distal stomach. Electronically Signed   By: Ronney Asters M.D.   On: 01/17/2021 20:02        Scheduled Meds:  aspirin EC  81 mg Oral Daily   atorvastatin  20 mg Oral Daily   enoxaparin (LOVENOX) injection  40 mg Subcutaneous Q24H   lisinopril  5 mg Oral Daily   Continuous Infusions:  lactated ringers 100 mL/hr at 01/18/21 0152     LOS: 1 day    Time spent: over 30 min    Fayrene Helper, MD [facility]   To contact the attending provider between 7A-7P or the covering provider during after hours 7P-7A, please log into the web site [email or web address] and access using universal  password for that web site. If you do  not have the password, please call the hospital operator.  01/18/2021, 6:54 PM

## 2021-01-19 LAB — BASIC METABOLIC PANEL
Anion gap: 6 (ref 5–15)
BUN: 14 mg/dL (ref 8–23)
CO2: 26 mmol/L (ref 22–32)
Calcium: 8.6 mg/dL — ABNORMAL LOW (ref 8.9–10.3)
Chloride: 107 mmol/L (ref 98–111)
Creatinine, Ser: 0.88 mg/dL (ref 0.61–1.24)
GFR, Estimated: >60 mL/min (ref >60)
Glucose, Bld: 89 mg/dL (ref 70–99)
Potassium: 4 mmol/L (ref 3.5–5.1)
Sodium: 139 mmol/L (ref 135–145)

## 2021-01-19 LAB — CBC
HCT: 35.5 % — ABNORMAL LOW (ref 39.0–52.0)
Hemoglobin: 11.9 g/dL — ABNORMAL LOW (ref 13.0–17.0)
MCH: 29.5 pg (ref 26.0–34.0)
MCHC: 33.5 g/dL (ref 30.0–36.0)
MCV: 87.9 fL (ref 80.0–100.0)
Platelets: 154 10*3/uL (ref 150–400)
RBC: 4.04 MIL/uL — ABNORMAL LOW (ref 4.22–5.81)
RDW: 13.1 % (ref 11.5–15.5)
WBC: 7 10*3/uL (ref 4.0–10.5)
nRBC: 0 % (ref 0.0–0.2)

## 2021-01-19 NOTE — Progress Notes (Signed)
Patient provided with discharge education and materials, verbalized understanding. IV access removed, no issues noted. Patient discharged from unit with all belongings.  ?

## 2021-01-19 NOTE — Discharge Summary (Signed)
Physician Discharge Summary  Jerry Ferguson Jerry Ferguson DOB: 01/30/1945 DOA: 01/16/2021  PCP: Jerry Ferguson, Edward, PA-C  Admit date: 01/16/2021 Discharge date: 01/19/2021  Time spent: 40 minutes  Recommendations for Outpatient Follow-up:  Follow outpatient CBC/CMP Follow with PCP outpatient, consider surgery follow up with hx SBO   Discharge Diagnoses:  Principal Problem:   SBO (small bowel obstruction) (HCC) Active Problems:   Essential hypertension   Leukocytosis   S/P TAVR (transcatheter aortic valve replacement)   Discharge Condition: stable  Diet recommendation: heart healthy  Filed Weights   01/16/21 2227  Weight: 94.3 kg    History of present illness:  Jerry Ferguson Jerry Ferguson 76 y.o. male with medical history significant for HTN, severe aortic stenosis status post TAVR, carotid atherosclerosis status post right carotid endarterectomy, history open appendectomy who presents for evaluation of abdominal pain.  He was found to have Jerry Ferguson small bowel obstruction.  He was managed conservatively and has improved.  Plan for discharge 1/14 with plan for outpatient follow up.   Hospital Course:  * SBO (small bowel obstruction) (HCC)- (present on admission) CT findings show Low to intermediate grade SBO transitioning in the distal ileum, ? Adhesive disease, ? Enteritis or ischemia.  Small volume abdominal/pelvic ascites. KUB without evidence of SBO Surgery c/s, appreciate recs - ok for d/c today if continued bowel function He's tolerated soft diet  Essential hypertension- (present on admission) Lisinopril   Leukocytosis Suspect reactive  S/P TAVR (transcatheter aortic valve replacement) Noted aspirin   Procedures: none   Consultations: surgery  Discharge Exam: Vitals:   01/19/21 0611 01/19/21 1356  BP: (!) 147/57 135/62  Pulse: 60 75  Resp: 16 18  Temp: 98.2 F (36.8 C) 98.5 F (36.9 C)  SpO2: 97% 100%   Tolerating soft diet Passing flatus, small  bm's  General: No acute distress. Cardiovascular: RRR Lungs: unlabored Abdomen: Soft, nontender, nondistended  Neurological: Alert and oriented 3. Moves all extremities 4 . Cranial nerves II through XII grossly intact. Skin: Warm and dry. No rashes or lesions. Extremities: No clubbing or cyanosis. No edema.  Discharge Instructions   Discharge Instructions     Call MD for:  difficulty breathing, headache or visual disturbances   Complete by: As directed    Call MD for:  extreme fatigue   Complete by: As directed    Call MD for:  hives   Complete by: As directed    Call MD for:  persistant dizziness or light-headedness   Complete by: As directed    Call MD for:  persistant nausea and vomiting   Complete by: As directed    Call MD for:  redness, tenderness, or signs of infection (pain, swelling, redness, odor or green/yellow discharge around incision site)   Complete by: As directed    Call MD for:  severe uncontrolled pain   Complete by: As directed    Call MD for:  temperature >100.4   Complete by: As directed    Diet - low sodium heart healthy   Complete by: As directed    Discharge instructions   Complete by: As directed    You were seen for Jerry Ferguson small bowel obstruction.  This has resolved after conservative management with NG tube and small bowel protocol.    Please follow up with your PCP outpatient to review this hospitalization and ensure that you continue to do well.  Return for new, recurrent, or worsening symptoms.  Please ask your PCP to request records from this hospitalization  so they know what was done and what the next steps will be.   Increase activity slowly   Complete by: As directed       Allergies as of 01/19/2021   No Known Allergies      Medication List     STOP taking these medications    amoxicillin 500 MG capsule Commonly known as: AMOXIL   HYDROcodone-acetaminophen 5-325 MG tablet Commonly known as: NORCO/VICODIN   predniSONE 5 MG  tablet Commonly known as: DELTASONE       TAKE these medications    albuterol 108 (90 Base) MCG/ACT inhaler Commonly known as: VENTOLIN HFA Inhale 2 puffs into the lungs every 6 (six) hours as needed. What changed: reasons to take this   aspirin EC 81 MG tablet Take 1 tablet (81 mg total) by mouth daily. Swallow whole.   atorvastatin 20 MG tablet Commonly known as: LIPITOR Take 1 tablet (20 mg total) by mouth daily.   cyclobenzaprine 5 MG tablet Commonly known as: FLEXERIL 1 tab po q hs as needed neck pain What changed:  how much to take how to take this when to take this reasons to take this additional instructions   diclofenac Sodium 1 % Gel Commonly known as: VOLTAREN Apply 2 g topically 4 (four) times daily. To affected joint.   fluticasone 50 MCG/ACT nasal spray Commonly known as: FLONASE SPRAY 2 SPRAYS INTO EACH NOSTRIL EVERY DAY What changed: See the new instructions.   lisinopril 5 MG tablet Commonly known as: ZESTRIL Take 1 tablet (5 mg total) by mouth daily.       No Known Allergies    The results of significant diagnostics from this hospitalization (including imaging, microbiology, ancillary and laboratory) are listed below for reference.    Significant Diagnostic Studies: DG Abdomen 1 View  Result Date: 01/17/2021 CLINICAL DATA:  NG G-tube placement EXAM: ABDOMEN - 1 VIEW COMPARISON:  CT 01/16/2021 FINDINGS: NG tube tip is in the distal stomach. Gas within mildly prominent central and right lower quadrant small bowel loops. IMPRESSION: NG tube tip in the distal stomach. Electronically Signed   By: Charlett Nose M.D.   On: 01/17/2021 03:50   CT ABDOMEN PELVIS W CONTRAST  Result Date: 01/16/2021 CLINICAL DATA:  Diffuse abdominal pain, nausea and vomiting. EXAM: CT ABDOMEN AND PELVIS WITH CONTRAST TECHNIQUE: Multidetector CT imaging of the abdomen and pelvis was performed using the standard protocol following bolus administration of intravenous  contrast. CONTRAST:  38mL OMNIPAQUE IOHEXOL 350 MG/ML SOLN COMPARISON:  CTA chest, abdomen and pelvis 02/05/2018 FINDINGS: Lower chest: Stable 4 mm subpleural right lower lobe nodule, series 6 axial 7. On this same image there is Jerry Ferguson stable 3 mm subpleural right lower lobe nodule posterior medially. Lung bases are clear of infiltrates and further nodules. Interval new TEVAR stenting in the aortic valve plane. The cardiac size is normal. 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 Hepatobiliary: Gallbladder and bile ducts unremarkable. No liver mass is seen. Pancreas: Unremarkable. No pancreatic ductal dilatation or surrounding inflammatory changes. Spleen: Slightly prominent , 12.8 cm in length with uniform enhancement. Adrenals/Urinary Tract: There is no adrenal mass. Perinephric stranding is similar to prior study with unremarkable renal cortex. There is no hydronephrosis or stone disease. There is mild bladder thickening versus underdistention, with bladder base impression by an enlarged prostate once again. Stomach/Bowel: The appendix is not seen and is suspected surgically absent. Small hiatal hernia. The gastric wall unremarkable with slight gastric fluid distention. There is  dilatation of the mid to lower abdominal small bowel segments up to 3.2 cm. The transitional segment is on series 2 axial 72 where Milliana Reddoch sharply angled distal ileal loop is noted below the level of the cecum suggesting adhesive disease as etiology. There is mucosal enhancement in multiple of the dilated small bowel segments in the lower abdomen, and fluid in the colon without colonic thickening or inflammatory change. No bowel pneumatosis is seen. Vascular/Lymphatic: The abdominal aorta is heavily calcified with moderate patchy calcification continuing into the iliacs. There is no AAA. No lymphadenopathy is seen. Reproductive: Enlarged prostate gland measuring 5 cm impressing on the bladder base. Unremarkable seminal vesicles. Other:  Small volume ascites is noted in the distal paracolic gutters and pelvis, and scattered along the mesenteric folds. There is no mesenteric inflammatory reaction identified. There are small umbilical and inguinal fat hernias. Multiple bilateral pelvic phleboliths. Musculoskeletal: There is osteopenia, degenerative changes of the spine and mild chronic and unchanged anterior wedge compression fractures of the T12 and L2 vertebral bodies. There is mild hip DJD. There is no worrisome regional skeletal lesion IMPRESSION: 1. Low to intermediate grade small bowel obstruction transitioning in the distal ileum at Vanda Waskey sharply angulated segment, suggest adhesive disease as etiology. There is mucosal enhancement in multiple dilated segments which could indicate enteritis or ischemia but there is no pneumatosis, free air or portal venous gas. Surgical consult recommended. 2. Small volume abdominal and pelvic ascites. No focal mesenteric inflammatory reaction/edema. 3. Fluid in the colon also is noted but no wall thickening or inflammatory reaction. 4. Prostatomegaly with bladder base impression mild bladder thickening versus underdistention, similar to the prior study. 5. Interval TEVAR. Heavy aortic and moderate iliac arterial calcific plaques. Coronary artery calcifications. 6. Small hiatal hernia with small umbilical and inguinal fat hernias. 7. Chronic mild compression fractures of T12 and L2. Osteopenia and degenerative change. Electronically Signed   By: Almira Bar M.D.   On: 01/16/2021 23:58   DG Abd Portable 1V-Small Bowel Obstruction Protocol-24 hr delay  Result Date: 01/18/2021 CLINICAL DATA:  Small-bowel obstruction EXAM: PORTABLE ABDOMEN - 1 VIEW COMPARISON:  01/17/2021 FINDINGS: Enteric tube is no longer present. Remains contrast within the colon. There are no dilated loops of small bowel. IMPRESSION: Residual contrast within the colon. No evidence of small-bowel obstruction. Electronically Signed   By:  Guadlupe Spanish M.D.   On: 01/18/2021 14:31   DG Abd Portable 1V-Small Bowel Obstruction Protocol-initial, 8 hr delay  Result Date: 01/17/2021 CLINICAL DATA:  8 hour delay for small bowel obstruction. EXAM: PORTABLE ABDOMEN - 1 VIEW COMPARISON:  Abdominal x-ray 01/17/2021. CT abdomen and pelvis 01/16/2021. FINDINGS: Nasogastric tube tip is at the level of the distal stomach, unchanged. Oral contrast is seen throughout the colon to level of the distal sigmoid colon. No dilated bowel loops are visualized. Phleboliths are noted in the pelvis. Catheter overlies the right pelvis. IMPRESSION: 1. Nonobstructive bowel gas pattern. Oral contrast reaches the sigmoid colon. 2. Enteric tube tip at the level of the distal stomach. Electronically Signed   By: Darliss Cheney M.D.   On: 01/17/2021 20:02    Microbiology: Recent Results (from the past 240 hour(s))  Resp Panel by RT-PCR (Flu Syris Brookens&B, Covid) Nasopharyngeal Swab     Status: None   Collection Time: 01/17/21  3:30 AM   Specimen: Nasopharyngeal Swab; Nasopharyngeal(NP) swabs in vial transport medium  Result Value Ref Range Status   SARS Coronavirus 2 by RT PCR NEGATIVE NEGATIVE Final  Comment: (NOTE) SARS-CoV-2 target nucleic acids are NOT DETECTED.  The SARS-CoV-2 RNA is generally detectable in upper respiratory specimens during the acute phase of infection. The lowest concentration of SARS-CoV-2 viral copies this assay can detect is 138 copies/mL. Jenie Parish negative result does not preclude SARS-Cov-2 infection and should not be used as the sole basis for treatment or other patient management decisions. Kandie Keiper negative result may occur with  improper specimen collection/handling, submission of specimen other than nasopharyngeal swab, presence of viral mutation(s) within the areas targeted by this assay, and inadequate number of viral copies(<138 copies/mL). Williette Loewe negative result must be combined with clinical observations, patient history, and  epidemiological information. The expected result is Negative.  Fact Sheet for Patients:  BloggerCourse.comhttps://www.fda.gov/media/152166/download  Fact Sheet for Healthcare Providers:  SeriousBroker.ithttps://www.fda.gov/media/152162/download  This test is no Ferguson yet approved or cleared by the [place] FDA and  has been authorized for detection and/or diagnosis of SARS-CoV-2 by FDA under an Emergency Use Authorization (EUA). This EUA will remain  in effect (meaning this test can be used) for the duration of the COVID-19 declaration under Section 564(b)(1) of the Act, 21 U.S.C.section 360bbb-3(b)(1), unless the authorization is terminated  or revoked sooner.       Influenza Latiqua Daloia by PCR NEGATIVE NEGATIVE Final   Influenza B by PCR NEGATIVE NEGATIVE Final    Comment: (NOTE) The Xpert Xpress SARS-CoV-2/FLU/RSV plus assay is intended as an aid in the diagnosis of influenza from Nasopharyngeal swab specimens and should not be used as Rafe Mackowski sole basis for treatment. Nasal washings and aspirates are unacceptable for Xpert Xpress SARS-CoV-2/FLU/RSV testing.  Fact Sheet for Patients: BloggerCourse.comhttps://www.fda.gov/media/152166/download  Fact Sheet for Healthcare Providers: SeriousBroker.ithttps://www.fda.gov/media/152162/download  This test is not yet approved or cleared by the [place] FDA and has been authorized for detection and/or diagnosis of SARS-CoV-2 by FDA under an Emergency Use Authorization (EUA). This EUA will remain in effect (meaning this test can be used) for the duration of the COVID-19 declaration under Section 564(b)(1) of the Act, 21 U.S.C. section 360bbb-3(b)(1), unless the authorization is terminated or revoked.  Performed at [facility], [address] [place], [place] [id number]      Labs: Basic Metabolic Panel: Recent Labs  Lab 01/16/21 2228 01/17/21 0538 01/18/21 0319 01/19/21 0359  NA 138 139 140 139  K 4.2 4.1 3.9 4.0  CL 104 103 107 107  CO2 24 26 25 26   GLUCOSE 140* 110* 92  89  BUN 14 19 18 14   CREATININE 1.14 1.11 0.98 0.88  CALCIUM 9.9 9.1 8.7* 8.6*  MG  --   --  2.3  --   PHOS  --   --  3.9  --    Liver Function Tests: Recent Labs  Lab 01/16/21 2228  AST 23  ALT 21  ALKPHOS 66  BILITOT 2.3*  PROT 7.8  ALBUMIN 4.7   Recent Labs  Lab 01/16/21 2228  LIPASE 54*   No results for input(s): AMMONIA in the last 168 hours. CBC: Recent Labs  Lab 01/16/21 2228 01/17/21 0538 01/18/21 0319 01/19/21 0359  WBC 14.0* 9.5 8.5 7.0  HGB 15.4 13.3 12.6* 11.9*  HCT 46.2 39.8 38.2* 35.5*  MCV 87.8 88.6 90.7 87.9  PLT 258 179 156 154   Cardiac Enzymes: No results for input(s): CKTOTAL, CKMB, CKMBINDEX, TROPONINI in the last 168 hours. BNP: BNP (last 3 results) No results for input(s): BNP in the last 8760 hours.  ProBNP (last 3 results) No results for input(s):  PROBNP in the last 8760 hours.  CBG: No results for input(s): GLUCAP in the last 168 hours.     Signed:  Lacretia Nicks MD.  [facility] 01/19/2021, 3:31 PM

## 2021-01-19 NOTE — Progress Notes (Signed)
Progress Note     Subjective: Tolerating liquids without nausea or bloating. No pain. No bowel function today yet, but 2 bm recorded yesterday.   Objective: Vital signs in last 24 hours: Temp:  [97.8 F (36.6 C)-98.4 F (36.9 C)] 98.4 F (36.9 C) (01/13 2134) Pulse Rate:  [71-74] 74 (01/13 2134) Resp:  [14-18] 18 (01/13 2134) BP: (125-152)/(58-70) 145/70 (01/13 2134) SpO2:  [92 %-99 %] 98 % (01/13 2134) Last BM Date: 01/18/21  Intake/Output from previous day: 01/13 0701 - 01/14 0700 In: 1200 [P.O.:1200] Out: -  Intake/Output this shift: No intake/output data recorded.  PE: General: pleasant, WD, WN male who is laying in bed in NAD Heart: regular, rate, and rhythm.  Lungs: CTAB, no wheezes, rhonchi, or rales noted.  Respiratory effort nonlabored Abd: soft, NT, ND MS: all 4 extremities are symmetrical with no cyanosis, clubbing, or edema. Skin: warm and dry with no masses, lesions, or rashes Neuro: Cranial nerves 2-12 grossly intact, sensation is normal throughout Psych: A&Ox3 with an appropriate affect.    Lab Results:  Recent Labs    01/18/21 0319 01/19/21 0359  WBC 8.5 7.0  HGB 12.6* 11.9*  HCT 38.2* 35.5*  PLT 156 154    BMET Recent Labs    01/17/21 0538 01/18/21 0319  NA 139 140  K 4.1 3.9  CL 103 107  CO2 26 25  GLUCOSE 110* 92  BUN 19 18  CREATININE 1.11 0.98  CALCIUM 9.1 8.7*    PT/INR No results for input(s): LABPROT, INR in the last 72 hours. CMP     Component Value Date/Time   NA 140 01/18/2021 0319   NA 143 01/26/2019 0733   K 3.9 01/18/2021 0319   CL 107 01/18/2021 0319   CO2 25 01/18/2021 0319   GLUCOSE 92 01/18/2021 0319   BUN 18 01/18/2021 0319   BUN 18 01/26/2019 0733   CREATININE 0.98 01/18/2021 0319   CREATININE 0.97 11/04/2013 1038   CALCIUM 8.7 (L) 01/18/2021 0319   PROT 7.8 01/16/2021 2228   PROT 6.7 01/26/2019 0733   ALBUMIN 4.7 01/16/2021 2228   ALBUMIN 4.7 01/26/2019 0733   AST 23 01/16/2021 2228   ALT 21  01/16/2021 2228   ALKPHOS 66 01/16/2021 2228   BILITOT 2.3 (H) 01/16/2021 2228   BILITOT 0.9 01/26/2019 0733   GFRNONAA >60 01/18/2021 0319   GFRNONAA 80 11/04/2013 1038   GFRAA >60 09/20/2019 0340   GFRAA >89 11/04/2013 1038   Lipase     Component Value Date/Time   LIPASE 54 (H) 01/16/2021 2228       Studies/Results: DG Abd Portable 1V-Small Bowel Obstruction Protocol-24 hr delay  Result Date: 01/18/2021 CLINICAL DATA:  Small-bowel obstruction EXAM: PORTABLE ABDOMEN - 1 VIEW COMPARISON:  01/17/2021 FINDINGS: Enteric tube is no longer present. Remains contrast within the colon. There are no dilated loops of small bowel. IMPRESSION: Residual contrast within the colon. No evidence of small-bowel obstruction. Electronically Signed   By: Guadlupe Spanish M.D.   On: 01/18/2021 14:31   DG Abd Portable 1V-Small Bowel Obstruction Protocol-initial, 8 hr delay  Result Date: 01/17/2021 CLINICAL DATA:  8 hour delay for small bowel obstruction. EXAM: PORTABLE ABDOMEN - 1 VIEW COMPARISON:  Abdominal x-ray 01/17/2021. CT abdomen and pelvis 01/16/2021. FINDINGS: Nasogastric tube tip is at the level of the distal stomach, unchanged. Oral contrast is seen throughout the colon to level of the distal sigmoid colon. No dilated bowel loops are visualized. Phleboliths are noted in the  pelvis. Catheter overlies the right pelvis. IMPRESSION: 1. Nonobstructive bowel gas pattern. Oral contrast reaches the sigmoid colon. 2. Enteric tube tip at the level of the distal stomach. Electronically Signed   By: Darliss Cheney M.D.   On: 01/17/2021 20:02    Anti-infectives: Anti-infectives (From admission, onward)    None        Assessment/Plan SBO, likely adhesive - history of open appendectomy - CT 1/11 w/ mall bowel obstruction transitioning in the distal ileum with mucosal enhancement possibly suggestive of enteritis or ischemia but no pneumatosis, free air, or portal venous gas.  Small hiatal hernia, small  umbilical and inguinal fat-containing hernias. - 8h delay film with contrast throughout colon and patient having bowel movements - Keep K > 4 and Mg > 2 for bowel function - Mobilize for bowel function - no indication for surgical intervention at this time  - tolerating diet. Will advance to soft.   OK for discharge home today if continued bowel function.    FEN: Soft, IVF per [facility] ID: none indicated VTE: okay for chemical prophylaxis from surgical standpoint  LOS: 2 days   I reviewed hospitalist notes, last 24 h vitals and pain scores, last 48 h intake and output, last 24 h labs and trends, and last 24 h imaging results.  This care required moderate level of medical decision making.    Berna Bue, MD [facility] 01/19/2021, 5:30 AM Please see Amion for pager number during day hours 7:00am-4:30pm

## 2021-01-21 ENCOUNTER — Telehealth: Payer: Self-pay

## 2021-01-21 ENCOUNTER — Other Ambulatory Visit: Payer: Self-pay | Admitting: Medical

## 2021-01-21 NOTE — Telephone Encounter (Signed)
Transition Care Management Follow-up Telephone Call Date of discharge and from where: 01/19/2021. Jerry Ferguson. Diagnosis: Small Bowel Obstruction How have you been since you were released from the hospital? Pt states he is feeling much better.  Any questions or concerns? No  Items Reviewed: Did the pt receive and understand the discharge instructions provided? Yes  Medications obtained and verified? No  Other? No  Any new allergies since your discharge? No  Dietary orders reviewed? Yes Do you have support at home? Yes   Home Care and Equipment/Supplies: Were home health services ordered? no If so, what is the name of the agency? N/A  Has the agency set up a time to come to the patient's home? not applicable Were any new equipment or medical supplies ordered?  No What is the name of the medical supply agency? N/A Were you able to get the supplies/equipment? not applicable Do you have any questions related to the use of the equipment or supplies? No  Functional Questionnaire: (I = Independent and D = Dependent) ADLs: I  Bathing/Dressing- I  Meal Prep- I  Eating- I  Maintaining continence- I  Transferring/Ambulation- I  Managing Meds- I  Follow up appointments reviewed:  PCP Hospital f/u appt confirmed? Yes  Scheduled to see Esperanza Richters, PA-C on 01/26/2020 @ 3:00. Specialist Hospital f/u appt confirmed?  N/A   Are transportation arrangements needed? No  If their condition worsens, is the pt aware to call PCP or go to the Emergency Dept.? Yes Was the patient provided with contact information for the PCP's office or ED? Yes Was to pt encouraged to call back with questions or concerns? Yes

## 2021-01-25 ENCOUNTER — Ambulatory Visit (INDEPENDENT_AMBULATORY_CARE_PROVIDER_SITE_OTHER): Payer: PPO | Admitting: Medical

## 2021-01-25 VITALS — BP 136/50 | HR 70 | Temp 98.2°F | Resp 18 | Ht 71.0 in | Wt 197.8 lb

## 2021-01-25 DIAGNOSIS — K56609 Unspecified intestinal obstruction, unspecified as to partial versus complete obstruction: Secondary | ICD-10-CM | POA: Diagnosis not present

## 2021-01-25 DIAGNOSIS — N400 Enlarged prostate without lower urinary tract symptoms: Secondary | ICD-10-CM

## 2021-01-25 DIAGNOSIS — Z8719 Personal history of other diseases of the digestive system: Secondary | ICD-10-CM

## 2021-01-25 DIAGNOSIS — R972 Elevated prostate specific antigen [PSA]: Secondary | ICD-10-CM

## 2021-01-25 NOTE — Progress Notes (Signed)
Subjective:    Patient ID: Jerry Ferguson, male    DOB: 01-02-1946, 76 y.o.   MRN: [id number]  HPI Pt in states Wednesday night had gi symptoms. Had bowel obstruction.  Admit date: 01/16/2021 Discharge date: 01/19/2021   Time spent: 40 minutes   "Recommendations for Outpatient Follow-up:  Follow outpatient CBC/CMP Follow with PCP outpatient, consider surgery follow up with hx SBO   Discharge Diagnoses:  Principal Problem:   SBO (small bowel obstruction) (HCC) Active Problems:   Essential hypertension   Leukocytosis   S/P TAVR (transcatheter aortic valve replacement)     History of present illness:  Jerry Ferguson is a 76 y.o. male with medical history significant for HTN, severe aortic stenosis status post TAVR, carotid atherosclerosis status post right carotid endarterectomy, history open appendectomy who presents for evaluation of abdominal pain.  He was found to have a small bowel obstruction.  He was managed conservatively and has improved.  Plan for discharge 1/14 with plan for outpatient follow up.    Hospital Course:  * SBO (small bowel obstruction) (HCC)- (present on admission) CT findings show Low to intermediate grade SBO transitioning in the distal ileum, ? Adhesive disease, ? Enteritis or ischemia.  Small volume abdominal/pelvic ascites. KUB without evidence of SBO Surgery c/s, appreciate recs - ok for d/c today if continued bowel function He's tolerated soft diet   Essential hypertension- (present on admission) Lisinopril    Leukocytosis Suspect reactive   S/P TAVR (transcatheter aortic valve replacement) Noted Aspirin    You were seen for a small bowel obstruction.  This has resolved after conservative management with NG tube and small bowel protocol.     Please follow up with your PCP outpatient to review this hospitalization and ensure that you continue to do well.   Return for new, recurrent, or worsening symptoms.   Please ask your PCP to  request records from this hospitalization so they know what was done and what the next steps will be."   Since discharge from the hospital feels well.  No abdominal pain, no cramping, no nausea, no vomiting and having daily bowel movements.  Also good appetite.  On review of CT abdomen done at the time of maltracking diagnosis prostate enlargement was noted.  On review of prior labs 2019 PSA was elevated.  Level was 6.36 in 2019.  MyChart result note sent "patient does have elevated PSA.  Last years PSA was 2.37.  Very important to refer to urologist.  We are going to put referral in and want patient to call our office in 10 days for update on referral if no one has called him by then."  On review I did place referral but it looks like urologist office never contacted patient?   Review of Systems  Constitutional:  Negative for chills, fatigue and fever.  HENT:  Negative for ear pain.   Respiratory:  Negative for cough, chest tightness and wheezing.   Cardiovascular:  Negative for chest pain and palpitations.  Gastrointestinal:  Negative for abdominal distention, abdominal pain, blood in stool, constipation, diarrhea, nausea and vomiting.  Genitourinary:  Negative for dysuria, frequency and urgency.  Musculoskeletal:  Negative for back pain and myalgias.  Skin:  Negative for rash.  Neurological:  Negative for dizziness, seizures, weakness, numbness and headaches.  Hematological:  Negative for adenopathy. Does not bruise/bleed easily.  Psychiatric/Behavioral:  Negative for behavioral problems and confusion.    Past Medical History:  Diagnosis Date   Abnormal liver  function test    Acute bronchitis    Carotid artery occlusion    Bilateral Bruit   Cataract    Coronary artery disease involving native coronary artery of native heart without angina pectoris    DJD (degenerative joint disease)    Dyspnea    GERD (gastroesophageal reflux disease)    Heart murmur    History of UTI     Hypercholesteremia    Hypertension    Lumbar back pain    Overweight(278.02)    Pulmonary nodules    Multiple small pulmonary nodules scattered throughout both lungs, 12 month CT rec if pt high risk   S/P TAVR (transcatheter aortic valve replacement)    26 mm Edwards Sapien 3 transcatheter heart valve placed via percutaneous right transfemoral approach    Severe aortic stenosis    Severe aortic stenosis      Social History   Socioeconomic History   Marital status: Widowed    Spouse name: stacey(deceased 05/21/09)   Number of children: 1   Years of education: Not on file   Highest education level: Not on file  Occupational History   Occupation: welder  Tobacco Use   Smoking status: Former    Packs/day: 1.00    Years: 15.00    Pack years: 15.00    Types: Cigarettes    Quit date: 01/06/1989    Years since quitting: 32.0   Smokeless tobacco: Never   Tobacco comments:    chewed some in early 2022/05/22  Vaping Use   Vaping Use: Never used  Substance and Sexual Activity   Alcohol use: Not Currently   Drug use: No   Sexual activity: Not on file  Other Topics Concern   Not on file  Social History Narrative   Uncle with prostate cancer?   Social Determinants of Health   Financial Resource Strain: Not on file  Food Insecurity: Not on file  Transportation Needs: Not on file  Physical Activity: Not on file  Stress: Not on file  Social Connections: Not on file  Intimate Partner Violence: Not on file    Past Surgical History:  Procedure Laterality Date   APPENDECTOMY     CAROTID ENDARTERECTOMY Right 09/19/2019   COLONOSCOPY     CORONARY ANGIOGRAPHY N/A 02/04/2018   Procedure: CORONARY ANGIOGRAPHY;  Surgeon: Tonny Bollman, MD;  Location[facility] INVASIVE CV LAB;  Service: Cardiovascular;  Laterality: N/A;   ENDARTERECTOMY Right 09/19/2019   Procedure: RIGHT ENDARTERECTOMY CAROTID;  Surgeon: Cephus Shelling, MD;  Location: [facility] OR;  Service: Vascular;  Laterality: Right;   PATCH  ANGIOPLASTY Right 09/19/2019   Procedure: PATCH ANGIOPLASTY RIGHT CAROTID;  Surgeon: Cephus Shelling, MD;  Location: [facility] OR;  Service: Vascular;  Laterality: Right;   RIGHT HEART CATH N/A 02/04/2018   Procedure: RIGHT HEART CATH;  Surgeon: Tonny Bollman, MD;  Location: [facility] INVASIVE CV LAB;  Service: Cardiovascular;  Laterality: N/A;   TEE WITHOUT CARDIOVERSION N/A 02/23/2018   Procedure: TRANSESOPHAGEAL ECHOCARDIOGRAM (TEE);  Surgeon: Tonny Bollman, MD;  Location: [facility] INVASIVE CV LAB;  Service: Open Heart Surgery;  Laterality: N/A;   TRANSCATHETER AORTIC VALVE REPLACEMENT, TRANSFEMORAL  02/23/2018   TRANSCATHETER AORTIC VALVE REPLACEMENT, TRANSFEMORAL N/A 02/23/2018   Procedure: TRANSCATHETER AORTIC VALVE REPLACEMENT, TRANSFEMORAL;  Surgeon: Tonny Bollman, MD;  Location: [facility] INVASIVE CV LAB;  Service: Open Heart Surgery;  Laterality: N/A;    Family History  Problem Relation Age of Onset   Colon cancer Father        metastatic  Other Father        DJD   Arthritis Father 1575   Healthy Mother        Living-87   Prostate cancer Brother    Heart attack Paternal Grandfather    Heart disease Paternal Grandfather    Cancer Paternal Uncle    Colon cancer Sister    Hypertension Daughter        x1   Healthy Daughter        x1   Esophageal cancer Neg Hx    Stomach cancer Neg Hx    Rectal cancer Neg Hx     No Known Allergies  Current Outpatient Medications on File Prior to Visit  Medication Sig Dispense Refill   albuterol (VENTOLIN HFA) 108 (90 Base) MCG/ACT inhaler Inhale 2 puffs into the lungs every 6 (six) hours as needed. (Patient taking differently: Inhale 2 puffs into the lungs every 6 (six) hours as needed for shortness of breath or wheezing.) 18 g 0   aspirin EC 81 MG tablet Take 1 tablet (81 mg total) by mouth daily. Swallow whole. 90 tablet 3   atorvastatin (LIPITOR) 20 MG tablet Take 1 tablet (20 mg total) by mouth daily. 90 tablet 3   cyclobenzaprine (FLEXERIL) 5 MG tablet  1 TAB BY MOUTH AT BEDTIME AS NEEDED FOR NECK PAIN 20 tablet 0   diclofenac Sodium (VOLTAREN) 1 % GEL Apply 2 g topically 4 (four) times daily. To affected joint. 100 g 11   fluticasone (FLONASE) 50 MCG/ACT nasal spray SPRAY 2 SPRAYS INTO EACH NOSTRIL EVERY DAY (Patient taking differently: Place 2 sprays into both nostrils daily.) 48 mL 1   lisinopril (ZESTRIL) 5 MG tablet Take 1 tablet (5 mg total) by mouth daily. 90 tablet 3   No current facility-administered medications on file prior to visit.    BP (!) 136/50    Pulse 70    Temp 98.2 F (36.8 C)    Resp 18    Ht 5\' 11"  (1.803 m)    Wt 197 lb 12.8 oz (89.7 kg)    SpO2 100%    BMI 27.59 kg/m        Objective:   Physical Exam  General Mental Status- Alert. General Appearance- Not in acute distress.   Skin General: Color- Normal Color. Moisture- Normal Moisture.  Neck Carotid Arteries- Normal color. Moisture- Normal Moisture. No carotid bruits. No JVD.  Chest and Lung Exam Auscultation: Breath Sounds:-Normal.  Cardiovascular Auscultation:Rythm- Regular. Murmurs & Other Heart Sounds:Auscultation of the heart reveals- No Murmurs.  Abdomen Inspection:-Inspeection Normal. Palpation/Percussion:Note:No mass. Palpation and Percussion of the abdomen reveal- Non Tender, Non Distended + BS, no rebound or guarding.   Neurologic Cranial Nerve exam:- CN III-XII intact(No nystagmus), symmetric smile. Strength:- 5/5 equal and symmetric strength both upper and lower extremities.       Assessment & Plan:   Follow-up from hospitalization for small bowel obstruction.  Patient did well with NG tube and conservative treatment.  No surgery was required.  Clinically stable and doing well presently.  We will get CBC and CMP for follow-up per discharge summary recommendations.  Notify us if any recurrent abdomen pain/signs/symptoms.  Continue current medication regimen.  On review of CT enlarged prostate.  2019 PSA elevation.  We will repeat  PSA today and I will follow that level.  If any elevation at all will refer to urologist again.  Follow-up date to be determined after lab and imaging review.

## 2021-01-25 NOTE — Patient Instructions (Signed)
Follow-up from hospitalization for small bowel obstruction.  Patient did well with NG tube and conservative treatment.  No surgery was required.  Clinically stable and doing well presently.  We will get CBC and CMP for follow-up per discharge summary recommendations.  Notify us if any recurrent abdomen pain/signs/symptoms.  Continue current medication regimen.  On review of CT enlarged prostate.  2019 PSA elevation.  We will repeat PSA today and I will follow that level.  If any elevation at all will refer to urologist again.  Follow-up date to be determined after lab and imaging review.

## 2021-01-26 LAB — CBC WITH DIFFERENTIAL/PLATELET
Absolute Monocytes: 694 cells/uL (ref 200–950)
Basophils Absolute: 80 cells/uL (ref 0–200)
Basophils Relative: 1.1 %
Eosinophils Absolute: 343 cells/uL (ref 15–500)
Eosinophils Relative: 4.7 %
HCT: 41 % (ref 38.5–50.0)
Hemoglobin: 13.8 g/dL (ref 13.2–17.1)
Lymphs Abs: 2000 cells/uL (ref 850–3900)
MCH: 29.3 pg (ref 27.0–33.0)
MCHC: 33.7 g/dL (ref 32.0–36.0)
MCV: 87 fL (ref 80.0–100.0)
MPV: 9.5 fL (ref 7.5–12.5)
Monocytes Relative: 9.5 %
Neutro Abs: 4183 cells/uL (ref 1500–7800)
Neutrophils Relative %: 57.3 %
Platelets: 247 10*3/uL (ref 140–400)
RBC: 4.71 10*6/uL (ref 4.20–5.80)
RDW: 12.7 % (ref 11.0–15.0)
Total Lymphocyte: 27.4 %
WBC: 7.3 10*3/uL (ref 3.8–10.8)

## 2021-01-26 LAB — COMPREHENSIVE METABOLIC PANEL
AG Ratio: 1.9 (calc) (ref 1.0–2.5)
ALT: 15 U/L (ref 9–46)
AST: 18 U/L (ref 10–35)
Albumin: 4.5 g/dL (ref 3.6–5.1)
Alkaline phosphatase (APISO): 63 U/L (ref 35–144)
BUN: 17 mg/dL (ref 7–25)
CO2: 24 mmol/L (ref 20–32)
Calcium: 9.3 mg/dL (ref 8.6–10.3)
Chloride: 107 mmol/L (ref 98–110)
Creat: 1.14 mg/dL (ref 0.70–1.28)
Globulin: 2.4 g/dL (calc) (ref 1.9–3.7)
Glucose, Bld: 87 mg/dL (ref 65–99)
Potassium: 5.1 mmol/L (ref 3.5–5.3)
Sodium: 141 mmol/L (ref 135–146)
Total Bilirubin: 1 mg/dL (ref 0.2–1.2)
Total Protein: 6.9 g/dL (ref 6.1–8.1)

## 2021-01-26 LAB — PSA: PSA: 2.86 ng/mL (ref <=4.00)

## 2021-01-28 ENCOUNTER — Telehealth: Payer: Self-pay | Admitting: *Deleted

## 2021-01-28 NOTE — Chronic Care Management (AMB) (Signed)
°  Care Management   Note  01/28/2021 Name: Jerry Ferguson MRN: [id number] DOB: 1945/04/17  Jerry Ferguson is a 76 y.o. year old male who is a primary care patient of [name], Iris Pert and is actively engaged with the care management team. I reached out to Joycie Peek by phone today to assist with scheduling a follow up visit with the Pharmacist  Follow up plan: Telephone appointment with care management team member scheduled for: 03/12/2021  Julian Hy, [place], [place] Management  Direct Dial: [phone]

## 2021-03-04 DIAGNOSIS — H25813 Combined forms of age-related cataract, bilateral: Secondary | ICD-10-CM | POA: Diagnosis not present

## 2021-03-04 DIAGNOSIS — H35033 Hypertensive retinopathy, bilateral: Secondary | ICD-10-CM | POA: Diagnosis not present

## 2021-03-04 DIAGNOSIS — H527 Unspecified disorder of refraction: Secondary | ICD-10-CM | POA: Diagnosis not present

## 2021-03-04 DIAGNOSIS — H52203 Unspecified astigmatism, bilateral: Secondary | ICD-10-CM | POA: Diagnosis not present

## 2021-03-04 DIAGNOSIS — H35363 Drusen (degenerative) of macula, bilateral: Secondary | ICD-10-CM | POA: Diagnosis not present

## 2021-03-04 DIAGNOSIS — H02831 Dermatochalasis of right upper eyelid: Secondary | ICD-10-CM | POA: Diagnosis not present

## 2021-03-04 DIAGNOSIS — H02834 Dermatochalasis of left upper eyelid: Secondary | ICD-10-CM | POA: Diagnosis not present

## 2021-03-04 DIAGNOSIS — H35372 Puckering of macula, left eye: Secondary | ICD-10-CM | POA: Diagnosis not present

## 2021-03-12 ENCOUNTER — Telehealth: Payer: PPO

## 2021-03-18 DIAGNOSIS — H25813 Combined forms of age-related cataract, bilateral: Secondary | ICD-10-CM | POA: Diagnosis not present

## 2021-03-18 DIAGNOSIS — H52203 Unspecified astigmatism, bilateral: Secondary | ICD-10-CM | POA: Diagnosis not present

## 2021-03-21 DIAGNOSIS — Z7982 Long term (current) use of aspirin: Secondary | ICD-10-CM | POA: Diagnosis not present

## 2021-03-21 DIAGNOSIS — H35363 Drusen (degenerative) of macula, bilateral: Secondary | ICD-10-CM | POA: Diagnosis not present

## 2021-03-21 DIAGNOSIS — I10 Essential (primary) hypertension: Secondary | ICD-10-CM | POA: Diagnosis not present

## 2021-03-21 DIAGNOSIS — Z87891 Personal history of nicotine dependence: Secondary | ICD-10-CM | POA: Diagnosis not present

## 2021-03-21 DIAGNOSIS — H25813 Combined forms of age-related cataract, bilateral: Secondary | ICD-10-CM | POA: Diagnosis not present

## 2021-03-21 DIAGNOSIS — H52223 Regular astigmatism, bilateral: Secondary | ICD-10-CM | POA: Diagnosis not present

## 2021-03-21 DIAGNOSIS — K219 Gastro-esophageal reflux disease without esophagitis: Secondary | ICD-10-CM | POA: Diagnosis not present

## 2021-03-21 DIAGNOSIS — E785 Hyperlipidemia, unspecified: Secondary | ICD-10-CM | POA: Diagnosis not present

## 2021-03-21 DIAGNOSIS — H02834 Dermatochalasis of left upper eyelid: Secondary | ICD-10-CM | POA: Diagnosis not present

## 2021-03-21 DIAGNOSIS — H35372 Puckering of macula, left eye: Secondary | ICD-10-CM | POA: Diagnosis not present

## 2021-03-21 DIAGNOSIS — H25812 Combined forms of age-related cataract, left eye: Secondary | ICD-10-CM | POA: Diagnosis not present

## 2021-03-21 DIAGNOSIS — Z9049 Acquired absence of other specified parts of digestive tract: Secondary | ICD-10-CM | POA: Diagnosis not present

## 2021-03-21 DIAGNOSIS — H02831 Dermatochalasis of right upper eyelid: Secondary | ICD-10-CM | POA: Diagnosis not present

## 2021-03-24 ENCOUNTER — Other Ambulatory Visit: Payer: Self-pay | Admitting: Medical

## 2021-03-24 DIAGNOSIS — R0989 Other specified symptoms and signs involving the circulatory and respiratory systems: Secondary | ICD-10-CM

## 2021-03-24 DIAGNOSIS — I779 Disorder of arteries and arterioles, unspecified: Secondary | ICD-10-CM

## 2021-03-24 DIAGNOSIS — I350 Nonrheumatic aortic (valve) stenosis: Secondary | ICD-10-CM

## 2021-03-24 DIAGNOSIS — I10 Essential (primary) hypertension: Secondary | ICD-10-CM

## 2021-03-24 DIAGNOSIS — E785 Hyperlipidemia, unspecified: Secondary | ICD-10-CM

## 2021-03-25 ENCOUNTER — Other Ambulatory Visit: Payer: Self-pay | Admitting: Medical

## 2021-03-28 DIAGNOSIS — Z87891 Personal history of nicotine dependence: Secondary | ICD-10-CM | POA: Diagnosis not present

## 2021-03-28 DIAGNOSIS — E785 Hyperlipidemia, unspecified: Secondary | ICD-10-CM | POA: Diagnosis not present

## 2021-03-28 DIAGNOSIS — Z79899 Other long term (current) drug therapy: Secondary | ICD-10-CM | POA: Diagnosis not present

## 2021-03-28 DIAGNOSIS — I10 Essential (primary) hypertension: Secondary | ICD-10-CM | POA: Diagnosis not present

## 2021-03-28 DIAGNOSIS — Z952 Presence of prosthetic heart valve: Secondary | ICD-10-CM | POA: Diagnosis not present

## 2021-03-28 DIAGNOSIS — Z7982 Long term (current) use of aspirin: Secondary | ICD-10-CM | POA: Diagnosis not present

## 2021-03-28 DIAGNOSIS — K219 Gastro-esophageal reflux disease without esophagitis: Secondary | ICD-10-CM | POA: Diagnosis not present

## 2021-03-28 DIAGNOSIS — H25811 Combined forms of age-related cataract, right eye: Secondary | ICD-10-CM | POA: Diagnosis not present

## 2021-03-28 DIAGNOSIS — H25813 Combined forms of age-related cataract, bilateral: Secondary | ICD-10-CM | POA: Diagnosis not present

## 2021-04-08 ENCOUNTER — Ambulatory Visit (INDEPENDENT_AMBULATORY_CARE_PROVIDER_SITE_OTHER): Payer: PPO | Admitting: Pharmacist

## 2021-04-08 DIAGNOSIS — E782 Mixed hyperlipidemia: Secondary | ICD-10-CM

## 2021-04-08 DIAGNOSIS — I10 Essential (primary) hypertension: Secondary | ICD-10-CM

## 2021-04-08 DIAGNOSIS — I2510 Atherosclerotic heart disease of native coronary artery without angina pectoris: Secondary | ICD-10-CM

## 2021-04-08 NOTE — Patient Instructions (Addendum)
Mr. Jerry Ferguson ?It was a pleasure speaking with you  ?Below is a summary of your health goals and care plan ? ? ?If you have any questions or concerns, please feel free to contact me either at the phone number below or with a MyChart message.  ? ?Keep up the good work! ? ?Henrene Pastor, PharmD ?Clinical Pharmacist ?[facility] ?[facility] ?[phone] (direct line)  ?[phone] (main office number) ? ? ?The patient verbalized understanding of instructions, educational materials, and care plan provided today and agreed to receive a mailed copy of patient instructions, educational materials, and care plan.  ? ?Chronic Care Management Care Plan ?  ?Hypertension ?BP Readings from Last 3 Encounters:  ?01/25/21 (!) 136/50  ?01/19/21 135/62  ?10/16/20 123/73  ? ?Pharmacist Clinical Goal(s): ?Over the next 180 days, patient will work with PharmD and providers to maintain BP goal <130/80 ?Current regimen:  ?Lisinopril 5mg  daily ?Interventions: ?Requested patient to check blood pressure once per week and record ?Patient self care activities - Over the next 180 days, patient will: ?Check blood pressure once per week, document, and provide at future appointments ?Ensure daily salt intake < 2300 mg/day ? ?Hyperlipidemia ?Lab Results  ?Component Value Date/Time  ? LDLCALC 63 08/28/2020 09:55 AM  ? LDLCALC 67 01/26/2019 07:33 AM  ? LDLDIRECT 95 11/04/2013 10:38 AM  ? ?Pharmacist Clinical Goal(s): ?Over the next 180 days, patient will work with PharmD and providers to maintain LDL goal <70 ?Current regimen:  ?Atorvastatin 20mg  daily ?Patient self care activities - Over the next 180 days, patient will: ?Maintain cholesterol medication regimen.  ? ?Medication management ?Pharmacist Clinical Goal(s): ?Over the next 180 days, patient will work with PharmD and providers to maintain optimal medication adherence ?Current pharmacy: [facility] ?Interventions ?Comprehensive medication review performed. ?Continue current  medication management strategy ?Patient self care activities - Over the next 180 days, patient will: ?Focus on medication adherence by filling medications appropriately  ?Take medications as prescribed ?Report any questions or concerns to PharmD and/or provider(s) ? ?Please see past updates related to this goal by clicking on the "Past Updates" button in the selected goal   ?

## 2021-04-08 NOTE — Chronic Care Management (AMB) (Addendum)
? ? ?Chronic Care Management ?Pharmacy Note ? ?04/08/2021 ?Name:  Jerry Ferguson MRN:  [id number] DOB:  07-15-1945 ? ?Summary: ?Reviewed medications with patient and health maintenance.  ?Patient has filled medications on time in 2023 and is taking as directed.  ?Recommended he consider getting Shingirx, colonoscopy and hep C testing.  ? ?Subjective: ?Jerry Ferguson is an 76 y.o. year old male who is a primary patient of Jerry Ferguson, Jerry Ferguson, [place].  The CCM team was consulted for assistance with disease management and care coordination needs.   ? ?Engaged with patient by telephone for follow up visit in response to provider referral for pharmacy case management and/or care coordination services.  ? ?Consent to Services:  ?The patient was given information about Chronic Care Management services, agreed to services, and gave verbal consent prior to initiation of services.  Please see initial visit note for detailed documentation.  ? ?Patient Care Team: ?Jerry Ferguson, Iris Pert as PCP - General (Internal Medicine) ?Jerry, Wonda Cheng, MD as PCP - Cardiology (Cardiology) ?Jerry Ferguson, RPH-CPP (Pharmacist) ? ?Recent office visits: ?01/25/2021 - Fam Med ([place], Jerry Ferguson) F/U small bowel obstruction. Clinical stable now. Checked CBC and CMP. On review of CT enlarged prostate.  2019 PSA elevation.  Repeated PSA. If any elevation at all, plan to refer to urologist again. All labs were WNL. ? ?Recent consult visits: ?03/28/2021 - Right eye catatract removal at Jerry Ferguson - Jerry Ferguson (ophthalmology)  ?03/21/2021 - Left eye catatract removal at Jerry Ferguson - Jerry Ferguson (ophthalmology)  ?10/16/2020 - Cardio / Vascular Surgery (Jerry Ferguson) F/U carotid artery disease.  Right carotid endarterectomy 09/19/2019.  Carotid duplex today shows endarterectomy site is patent w/ some disease in proximal patch but not significant or flow-limiting currently ? ?Hospital visits: ?01/16/2021 to 01/19/2021 Jerry Ferguson admission at [facility] for small bowel obstruction. Managed conservatively.  ?Medications started at discharge: None ?Medicatiosn stopped at discharge: amoxicillin, hydrocodone/APAP and prednisone 85m ?Medications chagnes at discharge: none noted.  ? ?Objective: ? ?Lab Results  ?Component Value Date  ? CREATININE 1.14 01/25/2021  ? CREATININE 0.88 01/19/2021  ? CREATININE 0.98 01/18/2021  ? ? ?Lab Results  ?Component Value Date  ? HGBA1C 5.6 08/28/2020  ? ?Last diabetic Eye exam: No results found for: HMDIABEYEEXA  ?Last diabetic Foot exam: No results found for: HMDIABFOOTEX  ? ?   ?Component Value Date/Time  ? CHOL 124 08/28/2020 0955  ? CHOL 134 01/26/2019 0733  ? TRIG 84.0 08/28/2020 0955  ? HDL 44.10 08/28/2020 0955  ? HDL 53 01/26/2019 0733  ? CHOLHDL 3 08/28/2020 0955  ? VLDL 16.8 08/28/2020 0955  ? LPerrin63 08/28/2020 0955  ? LCalifornia67 01/26/2019 0733  ? LDLDIRECT 95 11/04/2013 1038  ? ? ? ?  Latest Ref Rng & Units 01/25/2021  ?  4:25 PM 01/16/2021  ? 10:28 PM 08/28/2020  ?  9:55 AM  ?Hepatic Function  ?Total Protein 6.1 - 8.1 g/dL 6.9   7.8   6.4    ?Albumin 3.5 - 5.0 g/dL  4.7   4.2    ?AST 10 - 35 U/L _0 ?ALT 9 - 46 U/L _1 ?Alk Phosphatase 38 - 126 U/L  66   56    ?Total Bilirubin 0.2 - 1.2 mg/dL 1.0   2.3   1.4    ? ? ?Lab Results  ?Component Value Date/Time  ? TSH  2.11 08/13/2016 08:55 AM  ? TSH 1.48 11/04/2013 10:38 AM  ? ? ? ?  Latest Ref Rng & Units 01/25/2021  ?  4:25 PM 01/19/2021  ?  3:59 AM 01/18/2021  ?  3:19 AM  ?CBC  ?WBC 3.8 - 10.8 Thousand/uL 7.3   7.0   8.5    ?Hemoglobin 13.2 - 17.1 g/dL 13.8   11.9   12.6    ?Hematocrit 38.5 - 50.0 % 41.0   35.5   38.2    ?Platelets 140 - 400 Thousand/uL 247   154   156    ? ? ?No results found for: VD25OH ? ?Clinical ASCVD: Yes  ?The ASCVD Risk score (Arnett DK, et al., 2019) failed to calculate for the following reasons: ?  The valid total cholesterol range is 130 to 320 mg/dL   ? ? ?Social History  ? ?Tobacco Use  ?Smoking Status Former  ?  Packs/day: 1.00  ? Years: 15.00  ? Pack years: 15.00  ? Types: Cigarettes  ? Quit date: 01/06/1989  ? Years since quitting: 32.2  ?Smokeless Tobacco Never  ?Tobacco Comments  ? chewed some in early 20's  ? ?BP Readings from Last 3 Encounters:  ?01/25/21 (!) 136/50  ?01/19/21 135/62  ?10/16/20 123/73  ? ?Pulse Readings from Last 3 Encounters:  ?01/25/21 70  ?01/19/21 75  ?10/16/20 69  ? ?Wt Readings from Last 3 Encounters:  ?01/25/21 197 lb 12.8 oz (89.7 kg)  ?01/16/21 208 lb (94.3 kg)  ?10/16/20 192 lb (87.1 kg)  ? ? ?Assessment: Review of patient past medical history, allergies, medications, health status, including review of consultants reports, laboratory and other test data, was performed as part of comprehensive evaluation and provision of chronic care management services.  ? ?SDOH:  (Social Determinants of Health) assessments and interventions performed:  ?SDOH Interventions   ? ?Flowsheet Row Most Recent Value  ?SDOH Interventions   ?Financial Strain Interventions Intervention Not Indicated  ? ?  ? ? ?CCM Care Plan ? ?No Known Allergies ? ?Medications Reviewed Today   ? ? Reviewed by Eckard, Tammy, RPH-CPP (Pharmacist) on 04/08/21 at 1451  Med List Status: <None>  ? ?Medication Order Taking? Sig Documenting Provider Last Dose Status Informant  ?albuterol (VENTOLIN HFA) 108 (90 Base) MCG/ACT inhaler [id number] Yes Inhale 2 puffs into the lungs every 6 (six) hours as needed.  ?Patient taking differently: Inhale 2 puffs into the lungs every 6 (six) hours as needed for shortness of breath or wheezing.  ? Jerry Ferguson, Edward, PA-C Taking Active Self  ?aspirin EC 81 MG tablet [id number] Yes Take 1 tablet (81 mg total) by mouth daily. Swallow whole. Thompson, Kathryn R, PA-C Taking Active Self  ?atorvastatin (LIPITOR) 20 MG tablet [id number] Yes TAKE 1 TABLET BY MOUTH EVERY DAY Jerry Ferguson, Edward, PA-C Taking Active   ?cyclobenzaprine (FLEXERIL) 5 MG tablet [id number] No 1 TAB BY MOUTH AT BEDTIME AS NEEDED FOR NECK PAIN   ?Patient not taking: Reported on 04/08/2021  ? Jerry Ferguson, Edward, PA-C Not Taking Active   ?diclofenac Sodium (VOLTAREN) 1 % GEL [id number] Yes Apply 2 g topically 4 (four) times daily. To affected joint. Schmitz, Jeremy E, MD Taking Active Self  ?fluticasone (FLONASE) 50 MCG/ACT nasal spray [id number] Yes SPRAY 2 SPRAYS INTO EACH NOSTRIL EVERY DAY Jerry Ferguson, Edward, PA-C Taking Active Self  ?lisinopril (ZESTRIL) 5 MG tablet [id number] Yes TAKE 1 TABLET (5 MG TOTAL) BY MOUTH DAILY. Jerry Ferguson, Edward, PA-C Taking Active   ? ?  ?  ? ?  ? ? ?  Patient Active Problem List  ? Diagnosis Date Noted  ? SBO (small bowel obstruction) (HCC) 01/17/2021  ? Leukocytosis 01/17/2021  ? Cervical radiculopathy 09/20/2020  ? Carotid stenosis, right 09/19/2019  ? Pulmonary nodules   ? S/P TAVR (transcatheter aortic valve replacement)   ? Coronary artery disease involving native coronary artery of native heart without angina pectoris   ? Prostate cancer screening 11/04/2013  ? Screening for ischemic heart disease 11/04/2013  ? Carotid artery disease (HCC) 07/15/2013  ? Severe aortic stenosis 11/28/2012  ? Essential hypertension 11/28/2012  ? Weakness generalized 05/14/2010  ? OVERWEIGHT 02/27/2008  ? HLD (hyperlipidemia) 11/26/2007  ? GERD 11/26/2007  ? DEGENERATIVE JOINT DISEASE 11/26/2007  ? LIVER FUNCTION TESTS, ABNORMAL, HX OF 11/26/2007  ? ? ?Immunization History  ?Administered Date(s) Administered  ? Fluad Quad(high Dose 65+) 12/15/2018  ? Influenza Whole 11/01/2008  ? Influenza, High Dose Seasonal PF 10/21/2016, 10/09/2017  ? Influenza,inj,Quad PF,6+ Mos 11/01/2012  ? Pneumococcal Conjugate-13 05/04/2013  ? Pneumococcal Polysaccharide-23 08/13/2016  ? Td 05/06/2013  ? ? ?Conditions to be addressed/monitored: ?CAD, HTN, and HLD ? ?Care Plan : General Pharmacy (Adult)  ?Updates made by Eckard, Tammy, RPH-CPP since 04/08/2021 12:00 AM  ?  ? ?Problem: Chronic Conditions: HTN, HDL,CAD Resolved 04/08/2021  ?Note:   ?Current Barriers:  ?None identified  today ? ? ?Hypertension ?BP Readings from Last 3 Encounters:  ?01/25/21 (!) 136/50  ?01/19/21 135/62  ?10/16/20 123/73  ?Pharmacist Clinical Goal(s): ?Over the next 180 days, patient will work with PharmD and p

## 2021-05-05 DIAGNOSIS — I10 Essential (primary) hypertension: Secondary | ICD-10-CM

## 2021-05-05 DIAGNOSIS — E782 Mixed hyperlipidemia: Secondary | ICD-10-CM

## 2021-05-05 DIAGNOSIS — I2510 Atherosclerotic heart disease of native coronary artery without angina pectoris: Secondary | ICD-10-CM | POA: Diagnosis not present

## 2021-07-17 ENCOUNTER — Encounter: Payer: Self-pay | Admitting: Medical

## 2021-07-17 ENCOUNTER — Ambulatory Visit (INDEPENDENT_AMBULATORY_CARE_PROVIDER_SITE_OTHER): Payer: PPO | Admitting: Medical

## 2021-07-17 ENCOUNTER — Ambulatory Visit (HOSPITAL_BASED_OUTPATIENT_CLINIC_OR_DEPARTMENT_OTHER)
Admission: RE | Admit: 2021-07-17 | Discharge: 2021-07-17 | Disposition: A | Discharge status: Home / Self Care | Payer: PPO | Source: Ambulatory Visit | Attending: Medical | Admitting: Medical

## 2021-07-17 VITALS — BP 139/70 | HR 74 | Resp 18 | Ht 71.0 in | Wt 208.0 lb

## 2021-07-17 DIAGNOSIS — I6381 Other cerebral infarction due to occlusion or stenosis of small artery: Secondary | ICD-10-CM

## 2021-07-17 DIAGNOSIS — G4452 New daily persistent headache (NDPH): Secondary | ICD-10-CM

## 2021-07-17 DIAGNOSIS — R5383 Other fatigue: Secondary | ICD-10-CM

## 2021-07-17 DIAGNOSIS — M5412 Radiculopathy, cervical region: Secondary | ICD-10-CM

## 2021-07-17 DIAGNOSIS — R739 Hyperglycemia, unspecified: Secondary | ICD-10-CM | POA: Diagnosis not present

## 2021-07-17 DIAGNOSIS — R55 Syncope and collapse: Secondary | ICD-10-CM

## 2021-07-17 DIAGNOSIS — M542 Cervicalgia: Secondary | ICD-10-CM

## 2021-07-17 MED ORDER — CYCLOBENZAPRINE HCL 5 MG PO TABS: ORAL_TABLET | ORAL | 1 refills | Status: DC

## 2021-07-17 NOTE — Patient Instructions (Addendum)
Recent new onset moderate level ha with also hx of random/rare near syncope events. With new HA reported will place order for ct head with out contrast. If pending ct your symptoms change or worsen then be seen in the ED.  For neck pain with radiating pain to rt shoulder will get cervical spine xray.  Would advise continue flexeril 5 mg again. Can use tylenol or low dose nsaid. If you use nsaid  can use low dose alleve or ibuprofen as explained. But if you use nsaid make sure checking bp level. Also clarify with your cardiologist to see if he is ok with nsaid use.  Htn- bp reasonably controlled today. Continue lisinopril. I want you to check your bp if you feel any recurrent  near syncope or any severe ha.   For fatigue will get b1, b12, tsh t4, cbc and cmp.  Note if every do have syncope that would be ED/same day work up.  Follow up in 3 weeks or sooner if needed.

## 2021-07-17 NOTE — Progress Notes (Signed)
Subjective:    Patient ID: Jerry Ferguson, male    DOB: 12-12-1945, 76 y.o.   MRN: [id number]  HPI Pt in with recent worsening back of neck pain and some pain that radiates some toward rt shoulder and bicep. He states he had some neck pain in the past but recently the pain is worse.  Pt had xay of spine 02-22-2020.  FINDINGS: Trace retrolisthesis at C2-C3. Vertebral body heights are maintained. No prevertebral soft tissue swelling. Multilevel disc space narrowing with endplate osteophytes. Facet and uncovertebral hypertrophy contribute to osseous encroachment of the neural foramina.   IMPRESSION: Multilevel spondylosis.  Pt will use flexeril at night and some alleve during the day.  Has htn- he has not been checking his blood pressure.    Pt state he feel very fatigued. He states for couple of months.    Random and very rare presyncopal sensation at times. Last time was 2 months. When asked for other episodes he can't remember.   Pt states recently has been having moderate level daily ha for 2 months. Level ha would be 5/10 most of the time. No syncopal episodes. In the past with near syncope episodes did not have ha. No gross motor or sensory functions deficits.        Review of Systems  Constitutional:  Negative for chills, fatigue and fever.  Respiratory:  Negative for cough, chest tightness, shortness of breath and wheezing.   Cardiovascular:  Negative for chest pain and palpitations.  Gastrointestinal:  Negative for abdominal pain.  Musculoskeletal:  Positive for neck pain.  Skin:  Negative for rash.  Neurological:  Positive for light-headedness. Negative for syncope and weakness.       Random near syncope episodes.    Past Medical History:  Diagnosis Date   Abnormal liver function test    Acute bronchitis    Carotid artery occlusion    Bilateral Bruit   Cataract    Coronary artery disease involving native coronary artery of native heart without angina  pectoris    DJD (degenerative joint disease)    Dyspnea    GERD (gastroesophageal reflux disease)    Heart murmur    History of UTI    Hypercholesteremia    Hypertension    Lumbar back pain    Overweight(278.02)    Pulmonary nodules    Multiple small pulmonary nodules scattered throughout both lungs, 12 month CT rec if pt high risk   S/P TAVR (transcatheter aortic valve replacement)    26 mm Edwards Sapien 3 transcatheter heart valve placed via percutaneous right transfemoral approach    Severe aortic stenosis    Severe aortic stenosis      Social History   Socioeconomic History   Marital status: Widowed    Spouse name: stacey(deceased 04/26/09)   Number of children: 1   Years of education: Not on file   Highest education level: Not on file  Occupational History   Occupation: welder  Tobacco Use   Smoking status: Former    Packs/day: 1.00    Years: 15.00    Total pack years: 15.00    Types: Cigarettes    Quit date: 01/06/1989    Years since quitting: 32.5   Smokeless tobacco: Never   Tobacco comments:    chewed some in early April 27, 2022  Vaping Use   Vaping Use: Never used  Substance and Sexual Activity   Alcohol use: Not Currently   Drug use: No   Sexual activity: Not on  file  Other Topics Concern   Not on file  Social History Narrative   Uncle with prostate cancer?   Social Determinants of Health   Financial Resource Strain: Low Risk  (04/08/2021)   Overall Financial Resource Strain (CARDIA)    Difficulty of Paying Living Expenses: Not hard at all  Food Insecurity: Not on file  Transportation Needs: Not on file  Physical Activity: Not on file  Stress: Not on file  Social Connections: Not on file  Intimate Partner Violence: Not on file    Past Surgical History:  Procedure Laterality Date   APPENDECTOMY     CAROTID ENDARTERECTOMY Right 09/19/2019   COLONOSCOPY     CORONARY ANGIOGRAPHY N/A 02/04/2018   Procedure: CORONARY ANGIOGRAPHY;  Surgeon: Tonny Bollman, MD;   Location: [facility] INVASIVE CV LAB;  Service: Cardiovascular;  Laterality: N/A;   ENDARTERECTOMY Right 09/19/2019   Procedure: RIGHT ENDARTERECTOMY CAROTID;  Surgeon: Cephus Shelling, MD;  Location: [facility] OR;  Service: Vascular;  Laterality: Right;   PATCH ANGIOPLASTY Right 09/19/2019   Procedure: PATCH ANGIOPLASTY RIGHT CAROTID;  Surgeon: Cephus Shelling, MD;  Location: [facility] OR;  Service: Vascular;  Laterality: Right;   RIGHT HEART CATH N/A 02/04/2018   Procedure: RIGHT HEART CATH;  Surgeon: Tonny Bollman, MD;  Location: [facility] INVASIVE CV LAB;  Service: Cardiovascular;  Laterality: N/A;   TEE WITHOUT CARDIOVERSION N/A 02/23/2018   Procedure: TRANSESOPHAGEAL ECHOCARDIOGRAM (TEE);  Surgeon: Tonny Bollman, MD;  Location: [facility] INVASIVE CV LAB;  Service: Open Heart Surgery;  Laterality: N/A;   TRANSCATHETER AORTIC VALVE REPLACEMENT, TRANSFEMORAL  02/23/2018   TRANSCATHETER AORTIC VALVE REPLACEMENT, TRANSFEMORAL N/A 02/23/2018   Procedure: TRANSCATHETER AORTIC VALVE REPLACEMENT, TRANSFEMORAL;  Surgeon: Tonny Bollman, MD;  Location: [facility] INVASIVE CV LAB;  Service: Open Heart Surgery;  Laterality: N/A;    Family History  Problem Relation Age of Onset   Colon cancer Father        metastatic   Other Father        DJD   Arthritis Father 36   Healthy Mother        Living-87   Prostate cancer Brother    Heart attack Paternal Grandfather    Heart disease Paternal Grandfather    Cancer Paternal Uncle    Colon cancer Sister    Hypertension Daughter        x1   Healthy Daughter        x1   Esophageal cancer Neg Hx    Stomach cancer Neg Hx    Rectal cancer Neg Hx     No Known Allergies  Current Outpatient Medications on File Prior to Visit  Medication Sig Dispense Refill   albuterol (VENTOLIN HFA) 108 (90 Base) MCG/ACT inhaler Inhale 2 puffs into the lungs every 6 (six) hours as needed. (Patient taking differently: Inhale 2 puffs into the lungs every 6 (six) hours as needed for shortness of breath  or wheezing.) 18 g 0   aspirin EC 81 MG tablet Take 1 tablet (81 mg total) by mouth daily. Swallow whole. 90 tablet 3   atorvastatin (LIPITOR) 20 MG tablet TAKE 1 TABLET BY MOUTH EVERY DAY 90 tablet 3   cyclobenzaprine (FLEXERIL) 5 MG tablet 1 TAB BY MOUTH AT BEDTIME AS NEEDED FOR NECK PAIN 20 tablet 0   diclofenac Sodium (VOLTAREN) 1 % GEL Apply 2 g topically 4 (four) times daily. To affected joint. 100 g 11   fluticasone (FLONASE) 50 MCG/ACT nasal spray SPRAY 2 SPRAYS INTO EACH NOSTRIL  EVERY DAY 48 mL 1   lisinopril (ZESTRIL) 5 MG tablet TAKE 1 TABLET (5 MG TOTAL) BY MOUTH DAILY. 90 tablet 3   No current facility-administered medications on file prior to visit.    BP (!) 142/99   Pulse 74   Resp 18   Ht 5\' 11"  (1.803 m)   Wt 208 lb (94.3 kg)   SpO2 97%   BMI 29.01 kg/m        Objective:   Physical Exam   General Mental Status- Alert. General Appearance- Not in acute distress.   Skin General: Color- Normal Color. Moisture- Normal Moisture.  Neck Carotid Arteries- Normal color. Moisture- Normal Moisture. No carotid bruits. No JVD. Upper trapezius tenderness to palpation.   Chest and Lung Exam Auscultation: Breath Sounds:-Normal.  Cardiovascular Auscultation:Rythm- Regular. Murmurs & Other Heart Sounds:Auscultation of the heart reveals- No Murmurs.  Abdomen Inspection:-Inspeection Normal. Palpation/Percussion:Note:No mass. Palpation and Percussion of the abdomen reveal- Non Tender, Non Distended + BS, no rebound or guarding.   Neurologic Cranial Nerve exam:- CN III-XII intact(No nystagmus), symmetric smile. Drift Test:- No drift. Romberg Exam:- Negative.  Heal to Toe Gait exam:-Normal. Finger to Nose:- Normal/Intact Strength:- 5/5 equal and symmetric strength both upper and lower extremities.      Assessment & Plan:   Patient Instructions  Recent new onset moderate level ha with also hx of random/rare near syncope events. With new HA reported will place  order for ct head with out contrast. If pending ct your symptoms change or worsen then be seen in the ED.  For neck pain with radiating pain to rt shoulder will get cervical spine xray.  Would advise continue flexeril 5 mg again. Can use tylenol or low dose nsaid. If you use nsaid  can use low dose alleve or ibuprofen as explained. But if you use nsaid make sure checking bp level. Also clarify with your cardiologist to see if he is ok with nsaid use.  Htn- bp reasonably controlled today. Continue lisinopril. I want you to check your bp if you feel any recurrent  near syncope or any severe ha.   For fatigue will get b1, b12, tsh t4, cbc and cmp.  Follow up in 3 weeks or sooner if needed.    , PA-C

## 2021-07-18 LAB — CBC WITH DIFFERENTIAL/PLATELET
Basophils Absolute: 0.1 10*3/uL (ref 0.0–0.1)
Basophils Relative: 1.1 % (ref 0.0–3.0)
Eosinophils Absolute: 0.2 10*3/uL (ref 0.0–0.7)
Eosinophils Relative: 3.1 % (ref 0.0–5.0)
HCT: 42 % (ref 39.0–52.0)
Hemoglobin: 14.1 g/dL (ref 13.0–17.0)
Lymphocytes Relative: 27.8 % (ref 12.0–46.0)
Lymphs Abs: 2 10*3/uL (ref 0.7–4.0)
MCHC: 33.5 g/dL (ref 30.0–36.0)
MCV: 87.6 fl (ref 78.0–100.0)
Monocytes Absolute: 0.6 10*3/uL (ref 0.1–1.0)
Monocytes Relative: 8.2 % (ref 3.0–12.0)
Neutro Abs: 4.4 10*3/uL (ref 1.4–7.7)
Neutrophils Relative %: 59.8 % (ref 43.0–77.0)
Platelets: 183 10*3/uL (ref 150.0–400.0)
RBC: 4.8 Mil/uL (ref 4.22–5.81)
RDW: 13.9 % (ref 11.5–15.5)
WBC: 7.3 10*3/uL (ref 4.0–10.5)

## 2021-07-18 LAB — COMPREHENSIVE METABOLIC PANEL
ALT: 21 U/L (ref 0–53)
AST: 20 U/L (ref 0–37)
Albumin: 4.6 g/dL (ref 3.5–5.2)
Alkaline Phosphatase: 61 U/L (ref 39–117)
BUN: 19 mg/dL (ref 6–23)
CO2: 28 mEq/L (ref 19–32)
Calcium: 9.3 mg/dL (ref 8.4–10.5)
Chloride: 102 mEq/L (ref 96–112)
Creatinine, Ser: 1.17 mg/dL (ref 0.40–1.50)
GFR: 60.84 mL/min (ref >60.00)
Glucose, Bld: 93 mg/dL (ref 70–99)
Potassium: 4.4 mEq/L (ref 3.5–5.1)
Sodium: 138 mEq/L (ref 135–145)
Total Bilirubin: 2 mg/dL — ABNORMAL HIGH (ref 0.2–1.2)
Total Protein: 6.7 g/dL (ref 6.0–8.3)

## 2021-07-18 LAB — T4, FREE: Free T4: 0.82 ng/dL (ref 0.60–1.60)

## 2021-07-18 LAB — VITAMIN B12: Vitamin B-12: 304 pg/mL (ref 211–911)

## 2021-07-18 LAB — HEMOGLOBIN A1C: Hgb A1c MFr Bld: 5.6 % (ref 4.6–6.5)

## 2021-07-18 LAB — TSH: TSH: 2.17 u[IU]/mL (ref 0.35–5.50)

## 2021-07-21 ENCOUNTER — Encounter: Payer: Self-pay | Admitting: Cardiovascular Disease

## 2021-07-21 LAB — VITAMIN B1: Vitamin B1 (Thiamine): 8 nmol/L (ref 8–30)

## 2021-07-21 NOTE — Progress Notes (Unsigned)
Cardiology Office Note:    Date:  07/23/2021   ID:  Jerry Ferguson, DOB September 12, 1945, MRN [id number]  PCP:  Esperanza Richters, PA-C  Cardiologist:  Kristeen Miss, MD  Electrophysiologist:  None   Referring MD: Marisue Brooklyn   Chief Complaint  Patient presents with   Aortic Stenosis   s/p TAVR     Nov. 6, 2019   Jerry Ferguson is a 76 y.o. male with a hx of  HTN, HLD and aorticstenosis.  We were asked to see him by Esperanza Richters, PA-C for further evaluation of his aortic stenosis.   His last echocardiogram was performed June, 2015.  At that time, he had normal left ventricular systolic function.  He had moderate aortic stenosis. The mean aortic valve gradient was 23 mmHg with a peak aortic valve gradient of 41 mmHg. Mildly dilated ascending aorta.  He feels quite well Has mild DOE Mows his lawn without any problem  No CP , no syncope  No cough,  No weight gain Has lost some weight - has been traveling lots   Jan. 20, 2021  Jerry Ferguson is seen today after a several year absence He has recently had a TAVR Echocardiogram from today reveals normal left ventricular systolic function.  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   NYHA class  II  .   Gets short of breath with some activities - yard work )   has been more short of breath since we started Plavix instead of ASA  No angina.    Has RCA disease with collaterals from the left.  Has at least a 50% stenosis in his left circumflex artery peers in some views it appears to be a very tight ostial lesion but other views suggested only moderate in severity.  He has mild disease in his LAD.  He has a tight right carotid stenosis that needs to have a carotid endarterectomy.  Because of personal family issues and Covid he has not been able to get that fixed.   He will call Dr. Burna Forts office   The CT scan shows multiple small pulmonary nodules that are benign.  The CT is unchanged from  previous CT scans and he does not need any further follow-up on this pulmonary nodules.  I have personally reviewed the echo images.  He has mild to moderate aortic insufficiency which is likely to be due to a perivalvular leak.   April 03, 2020: Jerry Ferguson is seen following his TAVR.  No CP ,  Mild dyspnea ,  Knows he needs to exercise   July 23, 2021 Jerry Ferguson is seen for follow up of his AS , hx of TAVR Still having some doe and fatigue  Follows with Dr. Chestine Spore at [facility]   Is not getting much cardio exercise .  Is active    Past Medical History:  Diagnosis Date   Abnormal liver function test    Acute bronchitis    Carotid artery occlusion    Bilateral Bruit   Cataract    Coronary artery disease involving native coronary artery of native heart without angina pectoris    DJD (degenerative joint disease)    Dyspnea    GERD (gastroesophageal reflux disease)    Heart murmur    History of UTI    Hypercholesteremia    Hypertension    Lumbar back pain    Overweight(278.02)    Pulmonary nodules    Multiple small pulmonary nodules scattered  throughout both lungs, 12 month CT rec if pt high risk   S/P TAVR (transcatheter aortic valve replacement)    26 mm Edwards Sapien 3 transcatheter heart valve placed via percutaneous right transfemoral approach    Severe aortic stenosis    Severe aortic stenosis     Past Surgical History:  Procedure Laterality Date   APPENDECTOMY     CAROTID ENDARTERECTOMY Right 09/19/2019   COLONOSCOPY     CORONARY ANGIOGRAPHY N/A 02/04/2018   Procedure: CORONARY ANGIOGRAPHY;  Surgeon: Tonny Bollman, MD;  Location: [facility] INVASIVE CV LAB;  Service: Cardiovascular;  Laterality: N/A;   ENDARTERECTOMY Right 09/19/2019   Procedure: RIGHT ENDARTERECTOMY CAROTID;  Surgeon: Cephus Shelling, MD;  Location: [facility] OR;  Service: Vascular;  Laterality: Right;   PATCH ANGIOPLASTY Right 09/19/2019   Procedure: PATCH ANGIOPLASTY RIGHT CAROTID;  Surgeon: Cephus Shelling, MD;   Location: [facility] OR;  Service: Vascular;  Laterality: Right;   RIGHT HEART CATH N/A 02/04/2018   Procedure: RIGHT HEART CATH;  Surgeon: Tonny Bollman, MD;  Location: [facility] INVASIVE CV LAB;  Service: Cardiovascular;  Laterality: N/A;   TEE WITHOUT CARDIOVERSION N/A 02/23/2018   Procedure: TRANSESOPHAGEAL ECHOCARDIOGRAM (TEE);  Surgeon: Tonny Bollman, MD;  Location: [facility] INVASIVE CV LAB;  Service: Open Heart Surgery;  Laterality: N/A;   TRANSCATHETER AORTIC VALVE REPLACEMENT, TRANSFEMORAL  02/23/2018   TRANSCATHETER AORTIC VALVE REPLACEMENT, TRANSFEMORAL N/A 02/23/2018   Procedure: TRANSCATHETER AORTIC VALVE REPLACEMENT, TRANSFEMORAL;  Surgeon: Tonny Bollman, MD;  Location: [facility] INVASIVE CV LAB;  Service: Open Heart Surgery;  Laterality: N/A;    Current Medications: Current Meds  Medication Sig   aspirin EC 81 MG tablet Take 1 tablet (81 mg total) by mouth daily. Swallow whole.   atorvastatin (LIPITOR) 20 MG tablet TAKE 1 TABLET BY MOUTH EVERY DAY   cyclobenzaprine (FLEXERIL) 5 MG tablet 1 tab po q hs prn neck pain   diclofenac Sodium (VOLTAREN) 1 % GEL Apply 2 g topically 4 (four) times daily. To affected joint.   fluticasone (FLONASE) 50 MCG/ACT nasal spray SPRAY 2 SPRAYS INTO EACH NOSTRIL EVERY DAY   lisinopril (ZESTRIL) 5 MG tablet TAKE 1 TABLET (5 MG TOTAL) BY MOUTH DAILY.     Allergies:   Patient has no known allergies.   Social History   Socioeconomic History   Marital status: Widowed    Spouse name: stacey(deceased 29-Apr-2009)   Number of children: 1   Years of education: Not on file   Highest education level: Not on file  Occupational History   Occupation: welder  Tobacco Use   Smoking status: Former    Packs/day: 1.00    Years: 15.00    Total pack years: 15.00    Types: Cigarettes    Quit date: 01/06/1989    Years since quitting: 32.5   Smokeless tobacco: Never   Tobacco comments:    chewed some in early April 30, 2022  Vaping Use   Vaping Use: Never used  Substance and Sexual Activity    Alcohol use: Not Currently   Drug use: No   Sexual activity: Not on file  Other Topics Concern   Not on file  Social History Narrative   Uncle with prostate cancer?   Social Determinants of Health   Financial Resource Strain: Low Risk  (04/08/2021)   Overall Financial Resource Strain (CARDIA)    Difficulty of Paying Living Expenses: Not hard at all  Food Insecurity: Not on file  Transportation Needs: Not on file  Physical Activity: Not on file  Stress: Not on file  Social Connections: Not on file     Family History: The patient's family history includes Arthritis (age of onset: 82) in his father; Cancer in his paternal uncle; Colon cancer in his father and sister; Healthy in his daughter and mother; Heart attack in his paternal grandfather; Heart disease in his paternal grandfather; Hypertension in his daughter; Other in his father; Prostate cancer in his brother. There is no history of Esophageal cancer, Stomach cancer, or Rectal cancer.  ROS:   Please see the history of present illness.     All other systems reviewed and are negative.   Physical Exam: Blood pressure 140/72, pulse 81, height 5\' 11"  (1.803 m), weight 207 lb (93.9 kg), SpO2 97 %.  GEN:  Well nourished, well developed in no acute distress HEENT: Normal NECK: No JVD; No carotid bruits LYMPHATICS: No lymphadenopathy CARDIAC: RRR , soft systolic murmur  RESPIRATORY:  Clear to auscultation without rales, wheezing or rhonchi  ABDOMEN: Soft, non-tender, non-distended MUSCULOSKELETAL:  No edema; No deformity  SKIN: Warm and dry NEUROLOGIC:  Alert and oriented x 3    EKGs/Labs/Other Studies Reviewed:     EKG: July 23, 2021: Normal sinus rhythm at 81.  Normal EKG.   Recent Labs: 01/18/2021: Magnesium 2.3 07/17/2021: ALT 21; BUN 19; Creatinine, Ser 1.17; Hemoglobin 14.1; Platelets 183.0; Potassium 4.4; Sodium 138; TSH 2.17  Recent Lipid Panel    Component Value Date/Time   CHOL 124 08/28/2020 0955   CHOL 134  01/26/2019 0733   TRIG 84.0 08/28/2020 0955   HDL 44.10 08/28/2020 0955   HDL 53 01/26/2019 0733   CHOLHDL 3 08/28/2020 0955   VLDL 16.8 08/28/2020 0955   LDLCALC 63 08/28/2020 0955   LDLCALC 67 01/26/2019 0733   LDLDIRECT 95 11/04/2013 1038    Physical Exam:       ASSESSMENT:    1. S/P TAVR (transcatheter aortic valve replacement)   2. Coronary artery disease involving native coronary artery of native heart without angina pectoris   3. Mixed hyperlipidemia     PLAN:      Aortic stenosis:  S/p TAVR.  He has mild to moderate aortic insufficiency that is likely due to a perivalvular leak.    Overall seems very stable    2.  Shortness of breath:      3.  Lung nodules:   4.  Coronary artery disease:  No angina      Medication Adjustments/Labs and Tests Ordered: Current medicines are reviewed at length with the patient today.  Concerns regarding medicines are outlined above.  Orders Placed This Encounter  Procedures   Lipid panel   EKG 12-Lead   No orders of the defined types were placed in this encounter.   Patient Instructions  Medication Instructions:  Your physician recommends that you continue on your current medications as directed. Please refer to the Current Medication list given to you today.  *If you need a refill on your cardiac medications before your next appointment, please call your pharmacy*   Lab Work: LIPIDS today If you have labs (blood work) drawn today and your tests are completely normal, you will receive your results only by: MyChart Message (if you have MyChart) OR A paper copy in the mail If you have any lab test that is abnormal or we need to change your treatment, we will call you to review the results.   Testing/Procedures: NONE   Follow-Up: At [facility], you and your health needs are  our priority.  As part of our continuing mission to provide you with exceptional heart care, we have created designated Provider Care  Teams.  These Care Teams include your primary Cardiologist (physician) and Advanced Practice Providers (APPs -  Physician Assistants and Nurse Practitioners) who all work together to provide you with the care you need, when you need it.  We recommend signing up for the patient portal called "MyChart".  Sign up information is provided on this After Visit Summary.  MyChart is used to connect with patients for Virtual Visits (Telemedicine).  Patients are able to view lab/test results, encounter notes, upcoming appointments, etc.  Non-urgent messages can be sent to your provider as well.   To learn more about what you can do with MyChart, go to [email or web address].    Your next appointment:   1 year(s)  The format for your next appointment:   In Person  Provider:   Tereso Newcomer, PA {     Important Information About Sugar         Signed, Kristeen Miss, MD  07/23/2021 5:25 PM    [facility]

## 2021-07-23 ENCOUNTER — Ambulatory Visit: Payer: PPO | Admitting: Cardiovascular Disease

## 2021-07-23 ENCOUNTER — Encounter: Payer: Self-pay | Admitting: Cardiovascular Disease

## 2021-07-23 VITALS — BP 140/72 | HR 81 | Ht 71.0 in | Wt 207.0 lb

## 2021-07-23 DIAGNOSIS — I2510 Atherosclerotic heart disease of native coronary artery without angina pectoris: Secondary | ICD-10-CM

## 2021-07-23 DIAGNOSIS — Z952 Presence of prosthetic heart valve: Secondary | ICD-10-CM

## 2021-07-23 DIAGNOSIS — E782 Mixed hyperlipidemia: Secondary | ICD-10-CM

## 2021-07-23 NOTE — Patient Instructions (Signed)
Medication Instructions:  Your physician recommends that you continue on your current medications as directed. Please refer to the Current Medication list given to you today.  *If you need a refill on your cardiac medications before your next appointment, please call your pharmacy*   Lab Work: LIPIDS today If you have labs (blood work) drawn today and your tests are completely normal, you will receive your results only by: MyChart Message (if you have MyChart) OR A paper copy in the mail If you have any lab test that is abnormal or we need to change your treatment, we will call you to review the results.   Testing/Procedures: NONE   Follow-Up: At [facility], you and your health needs are our priority.  As part of our continuing mission to provide you with exceptional heart care, we have created designated Provider Care Teams.  These Care Teams include your primary Cardiologist (physician) and Advanced Practice Providers (APPs -  Physician Assistants and Nurse Practitioners) who all work together to provide you with the care you need, when you need it.  We recommend signing up for the patient portal called "MyChart".  Sign up information is provided on this After Visit Summary.  MyChart is used to connect with patients for Virtual Visits (Telemedicine).  Patients are able to view lab/test results, encounter notes, upcoming appointments, etc.  Non-urgent messages can be sent to your provider as well.   To learn more about what you can do with MyChart, go to [email or web address].    Your next appointment:   1 year(s)  The format for your next appointment:   In Person  Provider:   Tereso Newcomer, PA {     Important Information About Sugar

## 2021-07-24 ENCOUNTER — Ambulatory Visit (HOSPITAL_BASED_OUTPATIENT_CLINIC_OR_DEPARTMENT_OTHER)
Admission: RE | Admit: 2021-07-24 | Discharge: 2021-07-24 | Disposition: A | Discharge status: Home / Self Care | Payer: PPO | Source: Ambulatory Visit | Attending: Medical | Admitting: Medical

## 2021-07-24 DIAGNOSIS — R55 Syncope and collapse: Secondary | ICD-10-CM | POA: Diagnosis not present

## 2021-07-24 DIAGNOSIS — G4452 New daily persistent headache (NDPH): Secondary | ICD-10-CM | POA: Diagnosis not present

## 2021-07-24 DIAGNOSIS — R519 Headache, unspecified: Secondary | ICD-10-CM | POA: Diagnosis not present

## 2021-07-24 LAB — LIPID PANEL
Chol/HDL Ratio: 2.8 ratio (ref 0.0–5.0)
Cholesterol, Total: 127 mg/dL (ref 100–199)
HDL: 46 mg/dL (ref >39)
LDL Chol Calc (NIH): 65 mg/dL (ref 0–99)
Triglycerides: 83 mg/dL (ref 0–149)
VLDL Cholesterol Cal: 16 mg/dL (ref 5–40)

## 2021-07-25 NOTE — Addendum Note (Signed)
Addended by: Gwenevere Abbot on: 07/25/2021 10:23 PM   Modules accepted: Orders

## 2022-03-12 ENCOUNTER — Other Ambulatory Visit: Payer: Self-pay | Admitting: Medical

## 2022-03-12 DIAGNOSIS — E785 Hyperlipidemia, unspecified: Secondary | ICD-10-CM

## 2022-03-12 DIAGNOSIS — I779 Disorder of arteries and arterioles, unspecified: Secondary | ICD-10-CM

## 2022-03-12 DIAGNOSIS — I10 Essential (primary) hypertension: Secondary | ICD-10-CM

## 2022-03-12 DIAGNOSIS — I350 Nonrheumatic aortic (valve) stenosis: Secondary | ICD-10-CM

## 2022-03-12 DIAGNOSIS — R0989 Other specified symptoms and signs involving the circulatory and respiratory systems: Secondary | ICD-10-CM

## 2022-04-04 ENCOUNTER — Other Ambulatory Visit: Payer: Self-pay | Admitting: Medical

## 2022-04-04 DIAGNOSIS — I10 Essential (primary) hypertension: Secondary | ICD-10-CM

## 2022-04-04 DIAGNOSIS — I779 Disorder of arteries and arterioles, unspecified: Secondary | ICD-10-CM

## 2022-04-04 DIAGNOSIS — R0989 Other specified symptoms and signs involving the circulatory and respiratory systems: Secondary | ICD-10-CM

## 2022-04-04 DIAGNOSIS — I350 Nonrheumatic aortic (valve) stenosis: Secondary | ICD-10-CM

## 2022-04-04 DIAGNOSIS — E785 Hyperlipidemia, unspecified: Secondary | ICD-10-CM

## 2022-04-23 ENCOUNTER — Encounter: Payer: Self-pay | Admitting: *Deleted

## 2022-05-09 ENCOUNTER — Telehealth: Payer: Self-pay | Admitting: Medical

## 2022-05-09 ENCOUNTER — Encounter: Payer: Self-pay | Admitting: Medical

## 2022-05-09 ENCOUNTER — Ambulatory Visit (HOSPITAL_BASED_OUTPATIENT_CLINIC_OR_DEPARTMENT_OTHER)
Admission: RE | Admit: 2022-05-09 | Discharge: 2022-05-09 | Disposition: A | Discharge status: Home / Self Care | Payer: PPO | Source: Ambulatory Visit | Attending: Medical | Admitting: Medical

## 2022-05-09 ENCOUNTER — Ambulatory Visit (INDEPENDENT_AMBULATORY_CARE_PROVIDER_SITE_OTHER): Payer: PPO | Admitting: Medical

## 2022-05-09 ENCOUNTER — Other Ambulatory Visit: Payer: Self-pay | Admitting: Medical

## 2022-05-09 VITALS — BP 135/70 | HR 74 | Resp 18 | Ht 71.0 in | Wt 209.6 lb

## 2022-05-09 DIAGNOSIS — M542 Cervicalgia: Secondary | ICD-10-CM | POA: Diagnosis not present

## 2022-05-09 DIAGNOSIS — R739 Hyperglycemia, unspecified: Secondary | ICD-10-CM | POA: Diagnosis not present

## 2022-05-09 DIAGNOSIS — R0600 Dyspnea, unspecified: Secondary | ICD-10-CM | POA: Diagnosis not present

## 2022-05-09 DIAGNOSIS — I2510 Atherosclerotic heart disease of native coronary artery without angina pectoris: Secondary | ICD-10-CM | POA: Diagnosis not present

## 2022-05-09 DIAGNOSIS — I10 Essential (primary) hypertension: Secondary | ICD-10-CM

## 2022-05-09 DIAGNOSIS — R0602 Shortness of breath: Secondary | ICD-10-CM | POA: Diagnosis not present

## 2022-05-09 DIAGNOSIS — Z1211 Encounter for screening for malignant neoplasm of colon: Secondary | ICD-10-CM

## 2022-05-09 LAB — HEMOGLOBIN A1C: Hgb A1c MFr Bld: 5.5 % (ref 4.6–6.5)

## 2022-05-09 LAB — COMPREHENSIVE METABOLIC PANEL
ALT: 21 U/L (ref 0–53)
AST: 20 U/L (ref 0–37)
Albumin: 4.4 g/dL (ref 3.5–5.2)
Alkaline Phosphatase: 67 U/L (ref 39–117)
BUN: 14 mg/dL (ref 6–23)
CO2: 28 mEq/L (ref 19–32)
Calcium: 9.2 mg/dL (ref 8.4–10.5)
Chloride: 104 mEq/L (ref 96–112)
Creatinine, Ser: 1.05 mg/dL (ref 0.40–1.50)
GFR: 68.88 mL/min (ref >60.00)
Glucose, Bld: 84 mg/dL (ref 70–99)
Potassium: 4.6 mEq/L (ref 3.5–5.1)
Sodium: 142 mEq/L (ref 135–145)
Total Bilirubin: 1 mg/dL (ref 0.2–1.2)
Total Protein: 6.9 g/dL (ref 6.0–8.3)

## 2022-05-09 LAB — LIPID PANEL
Cholesterol: 139 mg/dL (ref 0–200)
HDL: 49.9 mg/dL (ref >39.00)
LDL Cholesterol: 73 mg/dL (ref 0–99)
NonHDL: 89.33
Total CHOL/HDL Ratio: 3
Triglycerides: 83 mg/dL (ref 0.0–149.0)
VLDL: 16.6 mg/dL (ref 0.0–40.0)

## 2022-05-09 LAB — BRAIN NATRIURETIC PEPTIDE: Pro B Natriuretic peptide (BNP): 11 pg/mL (ref 0.0–100.0)

## 2022-05-09 MED ORDER — CYCLOBENZAPRINE HCL 5 MG PO TABS: 5.0000 mg | ORAL_TABLET | Freq: Every day | ORAL | 1 refills | Status: DC

## 2022-05-09 MED ORDER — BUDESONIDE-FORMOTEROL FUMARATE 160-4.5 MCG/ACT IN AERO: 2.0000 | INHALATION_SPRAY | Freq: Two times a day (BID) | RESPIRATORY_TRACT | 12 refills | Status: DC

## 2022-05-09 NOTE — Progress Notes (Signed)
Subjective:    Patient ID: Jerry Ferguson, male    DOB: [birth year], 77 y.o.   MRN: [id number]  HPI  Pt in for follow up.  Last time I saw pt was July 17, 2021.  Htn- bp reasonably controlled today. Continue lisinopril. I want you to check your bp if you feel any recurrent near syncope or any severe ha.    Last lipid panel controlled. Pt is on atorvastatin 20 mg daily.  Hx of aortic stenosis. Last visit with cardioloigist 07-23-2021. Pt states has new cardioloigst that wil see in the summer.  Pt overdue for colonoscpy by 5 year per 05-21-2013 colonocopy report.  Advised pt can get shingrix thru the pharmacy.Marland Kitchen   Hx of smoking. Occasionally states will get sob and need to rest when walking. In past I had rx'd albuterol as trial. He does not really remember if made much of a difference.  Neck pain intermittent when he usually works on his cars. Finds flexeril will resolve pain usually one day. Often neck will cramp at night day after working on cars.     Review of Systems  Constitutional:  Negative for chills, fatigue and fever.  HENT:  Negative for congestion and ear pain.   Respiratory:  Negative for cough, chest tightness, shortness of breath and wheezing.   Cardiovascular:  Negative for chest pain and palpitations.  Gastrointestinal:  Negative for abdominal pain, constipation and diarrhea.  Genitourinary:  Negative for dysuria and frequency.  Musculoskeletal:  Negative for back pain, myalgias and neck pain.  Skin:  Negative for rash.  Neurological:  Negative for dizziness, speech difficulty, weakness, numbness and headaches.  Hematological:  Negative for adenopathy. Does not bruise/bleed easily.  Psychiatric/Behavioral:  Negative for behavioral problems, confusion and decreased concentration.     Past Medical History:  Diagnosis Date   Abnormal liver function test    Acute bronchitis    Carotid artery occlusion    Bilateral Bruit   Cataract    Coronary artery disease  involving native coronary artery of native heart without angina pectoris    DJD (degenerative joint disease)    Dyspnea    GERD (gastroesophageal reflux disease)    Heart murmur    History of UTI    Hypercholesteremia    Hypertension    Lumbar back pain    Overweight(278.02)    Pulmonary nodules    Multiple small pulmonary nodules scattered throughout both lungs, 12 month CT rec if pt high risk   S/P TAVR (transcatheter aortic valve replacement)    26 mm Edwards Sapien 3 transcatheter heart valve placed via percutaneous right transfemoral approach    Severe aortic stenosis    Severe aortic stenosis      Social History   Socioeconomic History   Marital status: Widowed    Spouse name: stacey(deceased 21-May-2009)   Number of children: 1   Years of education: Not on file   Highest education level: Not on file  Occupational History   Occupation: welder  Tobacco Use   Smoking status: Former    Packs/day: 1.00    Years: 15.00    Additional pack years: 0.00    Total pack years: 15.00    Types: Cigarettes    Quit date: 01/06/1989    Years since quitting: 33.3   Smokeless tobacco: Never   Tobacco comments:    chewed some in early 05/22/22  Vaping Use   Vaping Use: Never used  Substance and Sexual Activity   Alcohol  use: Not Currently   Drug use: No   Sexual activity: Not on file  Other Topics Concern   Not on file  Social History Narrative   Uncle with prostate cancer?   Social Determinants of Health   Financial Resource Strain: Low Risk  (04/08/2021)   Overall Financial Resource Strain (CARDIA)    Difficulty of Paying Living Expenses: Not hard at all  Food Insecurity: Not on file  Transportation Needs: Not on file  Physical Activity: Not on file  Stress: Not on file  Social Connections: Not on file  Intimate Partner Violence: Not on file    Past Surgical History:  Procedure Laterality Date   APPENDECTOMY     CAROTID ENDARTERECTOMY Right 09/19/2019   COLONOSCOPY      CORONARY ANGIOGRAPHY N/A 02/04/2018   Procedure: CORONARY ANGIOGRAPHY;  Surgeon: Tonny Bollman, MD;  Location: [facility] INVASIVE CV LAB;  Service: Cardiovascular;  Laterality: N/A;   ENDARTERECTOMY Right 09/19/2019   Procedure: RIGHT ENDARTERECTOMY CAROTID;  Surgeon: Cephus Shelling, MD;  Location: [facility] OR;  Service: Vascular;  Laterality: Right;   PATCH ANGIOPLASTY Right 09/19/2019   Procedure: PATCH ANGIOPLASTY RIGHT CAROTID;  Surgeon: Cephus Shelling, MD;  Location: [facility] OR;  Service: Vascular;  Laterality: Right;   RIGHT HEART CATH N/A 02/04/2018   Procedure: RIGHT HEART CATH;  Surgeon: Tonny Bollman, MD;  Location: [facility] INVASIVE CV LAB;  Service: Cardiovascular;  Laterality: N/A;   TEE WITHOUT CARDIOVERSION N/A 02/23/2018   Procedure: TRANSESOPHAGEAL ECHOCARDIOGRAM (TEE);  Surgeon: Tonny Bollman, MD;  Location: [facility] INVASIVE CV LAB;  Service: Open Heart Surgery;  Laterality: N/A;   TRANSCATHETER AORTIC VALVE REPLACEMENT, TRANSFEMORAL  02/23/2018   TRANSCATHETER AORTIC VALVE REPLACEMENT, TRANSFEMORAL N/A 02/23/2018   Procedure: TRANSCATHETER AORTIC VALVE REPLACEMENT, TRANSFEMORAL;  Surgeon: Tonny Bollman, MD;  Location: [facility] INVASIVE CV LAB;  Service: Open Heart Surgery;  Laterality: N/A;    Family History  Problem Relation Age of Onset   Colon cancer Father        metastatic   Other Father        DJD   Arthritis Father 17   Healthy Mother        Living-87   Prostate cancer Brother    Heart attack Paternal Grandfather    Heart disease Paternal Grandfather    Cancer Paternal Uncle    Colon cancer Sister    Hypertension Daughter        x1   Healthy Daughter        x1   Esophageal cancer Neg Hx    Stomach cancer Neg Hx    Rectal cancer Neg Hx     No Known Allergies  Current Outpatient Medications on File Prior to Visit  Medication Sig Dispense Refill   aspirin EC 81 MG tablet Take 1 tablet (81 mg total) by mouth daily. Swallow whole. 90 tablet 3   atorvastatin (LIPITOR) 20 MG  tablet TAKE 1 TABLET BY MOUTH EVERY DAY 90 tablet 1   cyclobenzaprine (FLEXERIL) 5 MG tablet 1 tab po q hs prn neck pain 20 tablet 1   diclofenac Sodium (VOLTAREN) 1 % GEL Apply 2 g topically 4 (four) times daily. To affected joint. 100 g 11   fluticasone (FLONASE) 50 MCG/ACT nasal spray SPRAY 2 SPRAYS INTO EACH NOSTRIL EVERY DAY 48 mL 1   lisinopril (ZESTRIL) 5 MG tablet TAKE 1 TABLET (5 MG TOTAL) BY MOUTH DAILY. 90 tablet 1   No current facility-administered medications on file prior to visit.  BP 138/70   Pulse 74   Resp 18   Ht 5\' 11"  (1.803 m)   Wt 209 lb 9.6 oz (95.1 kg)   SpO2 98%   BMI 29.23 kg/m        Objective:   Physical Exam  General Mental Status- Alert. General Appearance- Not in acute distress.   Skin General: Color- Normal Color. Moisture- Normal Moisture.  Neck Carotid Arteries- Normal color. Moisture- Normal Moisture. No carotid bruits. No JVD.  Chest and Lung Exam Auscultation: Breath Sounds:-Normal.  Cardiovascular Auscultation:Rythm- Regular. Murmurs & Other Heart Sounds:Auscultation of the heart reveals- No Murmurs.  Abdomen Inspection:-Inspeection Normal. Palpation/Percussion:Note:No mass. Palpation and Percussion of the abdomen reveal- Non Tender, Non Distended + BS, no rebound or guarding.   Neurologic Cranial Nerve exam:- CN III-XII intact(No nystagmus), symmetric smile. Strength:- 5/5 equal and symmetric strength both upper and lower extremities.       Assessment & Plan:   Patient Instructions  1. Screening for colon cancer Due per chart review so placed the referral.  - Ambulatory referral to Gastroenterology  2. Elevated blood sugar Low sugar diet recommended - Hemoglobin A1c  3. Coronary artery disease involving native coronary artery of native heart without angina pectoris Continue current statin. - Lipid panel  4. Essential hypertension Better controlled on recheck. Continue lisinopril. - Comp Met (CMET)  5.  Dyspnea, unspecified type Rx symbicort. See if this helps with occasional dyspnea.  - B Nat Peptide - DG Chest 2 View; Future  6. Neck pain As needed flexerl to use at night only as needed neck pain/spasms   Follow up  date to be determined after lab review.   Esperanza Richters, PA-C

## 2022-05-09 NOTE — Patient Instructions (Addendum)
1. Screening for colon cancer Due per chart review so placed the referral.  - Ambulatory referral to Gastroenterology  2. Elevated blood sugar Low sugar diet recommended - Hemoglobin A1c  3. Coronary artery disease involving native coronary artery of native heart without angina pectoris Continue current statin. - Lipid panel  4. Essential hypertension Better controlled on recheck. Continue lisinopril. - Comp Met (CMET)  5. Dyspnea, unspecified type Rx symbicort. See if this helps with occasional dyspnea.  - B Nat Peptide - DG Chest 2 View; Future  6. Neck pain As needed flexerl to use at night only as needed neck pain/spasms   Follow up  date to be determined after lab review.

## 2022-05-12 NOTE — Telephone Encounter (Signed)
PA initiated via Covermymeds; KEY: [id number]. Awaiting determination.

## 2022-05-12 NOTE — Telephone Encounter (Signed)
PA denied. Awaiting denial information.   Plan requirements for the approval of this request: Diagnosis of a medically-accepted indication, AND; The patient has tried and failed at least one formulary alternative, OR; The prescriber can submit a supporting statement to explain

## 2022-07-21 NOTE — Progress Notes (Unsigned)
Cardiology Office Note:    Date:  07/22/2022  ID:  Jerry Ferguson, DOB 1945/04/29, MRN [id number] PCP: Marisue Brooklyn  [facility] Providers Cardiologist:  Kristeen Miss, MD       Patient Profile:      Severe aortic stenosis  S/p TAVR 02/2018 TTE 04/03/2020: S/p TAVR with mild paravalvular AI, normal structure and function of TAVR, mean gradient 5, EF 65, no RWMA, mild LVH, normal RVSF, trivial MR, RAP 3, mild dilation of ascending aorta (40 mm) Coronary artery disease  LHC 02/04/2018: LM ostial 30, distal 30; LAD proximal 40; LCx ostial 50; RCA mid 80, distal 95, 80, RPDA ostial 80 Myoview 08/03/2019: EF 64, no ischemia, low risk Carotid artery disease  S/p R CEA 09/2019 Carotid US 10/16/20: patent R CEA site w 1-39; LICA 1-39  Hyperlipidemia  Hypertension  Pulmonary nodules       History of Present Illness:   Jerry Ferguson is a 77 y.o. male who returns for follow-up of aortic stenosis s/p prior TAVR, CAD.  He was last seen by Dr. Elease Hashimoto 07/2021. He is here alone. He has not had chest pain, syncope, orthopnea, edema. He does get short of breath with some activities. This is fairly chronic.   Review of Systems  Cardiovascular:  Negative for claudication.  Gastrointestinal:  Negative for hematochezia and melena.  Genitourinary:  Negative for hematuria.   See HPI    Studies Reviewed:   EKG Interpretation Date/Time:  Tuesday July 22 2022 10:14:49 EDT Ventricular Rate:  63 PR Interval:  166 QRS Duration:  98 QT Interval:  406 QTC Calculation: 415 R Axis:   34  Text Interpretation: Normal sinus rhythm Normal axis Non-specific ST-t changes No change when compared to prior ECGs Confirmed by Tereso Newcomer [phone]) on 07/22/2022 10:38:19 AM   Risk Assessment/Calculations:           Physical Exam:   VS:  BP (!) 130/52   Pulse 63   Ht 5\' 11"  (1.803 m)   Wt 207 lb 9.6 oz (94.2 kg)   SpO2 97%   BMI 28.95 kg/m    Wt Readings from Last 3 Encounters:  07/22/22 207 lb  9.6 oz (94.2 kg)  05/09/22 209 lb 9.6 oz (95.1 kg)  07/23/21 207 lb (93.9 kg)    Constitutional:      Appearance: Healthy appearance. Not in distress.  Neck:     Vascular: JVD normal.  Pulmonary:     Breath sounds: Normal breath sounds. No wheezing. No rales.  Cardiovascular:     Normal rate. Regular rhythm.     Murmurs: There is no murmur.  Edema:    Peripheral edema absent.  Abdominal:     Palpations: Abdomen is soft.      ASSESSMENT AND PLAN:   Severe aortic stenosis S/p TAVR in 02/2018. Echocardiogram in 03/2020 with mild paravalvular leak. He notes some chronic shortness of breath. This does not seem to be getting any worse. It has been 2 years since his last echocardiogram. Obtain follow up echocardiogram  Continue SBE prophylaxis Follow up 1 year.   Coronary artery disease involving native coronary artery of native heart without angina pectoris Cath in 2020 with high grade disease in RCA. This has been managed medically. He is doing well w/o chest pain to suggest angina. Continue Atorvastatin, ASA 81 mg once daily.  HLD (hyperlipidemia) Goal LDL is at least < 70, ideally < 55. Labs from primary care reviewed. In May 2024,  LDL was above goal at 73. Increase Atorvastatin to 40 mg once daily. Check Lipids, LFTs in 3 mos.   Essential hypertension BP controlled. Continue Lisinopril 5 mg once daily.  Carotid artery disease (HCC) S/p R CEA in 2021. Last [place] with vascular surgery was in 2022. We will try to arrange follow up with vascular surgery.       Dispo:  Return in about 1 year (around 07/22/2023) for Routine Follow Up, w/ Dr. Elease Hashimoto.  Signed, Tereso Newcomer, PA-C

## 2022-07-22 ENCOUNTER — Encounter: Payer: Self-pay | Admitting: Physician Assistant

## 2022-07-22 ENCOUNTER — Telehealth: Payer: Self-pay | Admitting: Vascular Surgery

## 2022-07-22 ENCOUNTER — Ambulatory Visit: Payer: PPO | Attending: Physician Assistant | Admitting: Physician Assistant

## 2022-07-22 VITALS — BP 130/52 | HR 63 | Ht 71.0 in | Wt 207.6 lb

## 2022-07-22 DIAGNOSIS — I10 Essential (primary) hypertension: Secondary | ICD-10-CM | POA: Diagnosis not present

## 2022-07-22 DIAGNOSIS — I351 Nonrheumatic aortic (valve) insufficiency: Secondary | ICD-10-CM

## 2022-07-22 DIAGNOSIS — E782 Mixed hyperlipidemia: Secondary | ICD-10-CM

## 2022-07-22 DIAGNOSIS — Z952 Presence of prosthetic heart valve: Secondary | ICD-10-CM

## 2022-07-22 DIAGNOSIS — I6523 Occlusion and stenosis of bilateral carotid arteries: Secondary | ICD-10-CM | POA: Diagnosis not present

## 2022-07-22 DIAGNOSIS — I350 Nonrheumatic aortic (valve) stenosis: Secondary | ICD-10-CM | POA: Diagnosis not present

## 2022-07-22 DIAGNOSIS — I2510 Atherosclerotic heart disease of native coronary artery without angina pectoris: Secondary | ICD-10-CM

## 2022-07-22 MED ORDER — ATORVASTATIN CALCIUM 40 MG PO TABS: 40.0000 mg | ORAL_TABLET | Freq: Every day | ORAL | 3 refills | Status: DC

## 2022-07-22 NOTE — Patient Instructions (Signed)
Medication Instructions:  Your physician has recommended you make the following change in your medication:   INCREASE Atorvastatin to 40 mg taking 1 daily  *If you need a refill on your cardiac medications before your next appointment, please call your pharmacy*   Lab Work: 3 MONTHS:  FASTING LIPID & LIPID  If you have labs (blood work) drawn today and your tests are completely normal, you will receive your results only by: MyChart Message (if you have MyChart) OR A paper copy in the mail If you have any lab test that is abnormal or we need to change your treatment, we will call you to review the results.   Testing/Procedures: Your physician has requested that you have an echocardiogram. Echocardiography is a painless test that uses sound waves to create images of your heart. It provides your doctor with information about the size and shape of your heart and how well your heart's chambers and valves are working. This procedure takes approximately one hour. There are no restrictions for this procedure. Please do NOT wear cologne, perfume, aftershave, or lotions (deodorant is allowed). Please arrive 15 minutes prior to your appointment time.    Follow-Up: At [facility], you and your health needs are our priority.  As part of our continuing mission to provide you with exceptional heart care, we have created designated Provider Care Teams.  These Care Teams include your primary Cardiologist (physician) and Advanced Practice Providers (APPs -  Physician Assistants and Nurse Practitioners) who all work together to provide you with the care you need, when you need it.  We recommend signing up for the patient portal called "MyChart".  Sign up information is provided on this After Visit Summary.  MyChart is used to connect with patients for Virtual Visits (Telemedicine).  Patients are able to view lab/test results, encounter notes, upcoming appointments, etc.  Non-urgent messages can be  sent to your provider as well.   To learn more about what you can do with MyChart, go to [email or web address].    Your next appointment:   1 year(s)  Provider:   Kristeen Miss, MD     Other Instructions

## 2022-07-22 NOTE — Assessment & Plan Note (Signed)
Goal LDL is at least < 70, ideally < 55. Labs from primary care reviewed. In May 2024, LDL was above goal at 73. Increase Atorvastatin to 40 mg once daily. Check Lipids, LFTs in 3 mos.

## 2022-07-22 NOTE — Telephone Encounter (Signed)
calling to schedule past due follow up appt per another MD office calling- asked if we can give pt a call. sending letter.

## 2022-07-22 NOTE — Assessment & Plan Note (Signed)
S/p R CEA in 2021. Last [place] with vascular surgery was in 2022. We will try to arrange follow up with vascular surgery.

## 2022-07-22 NOTE — Assessment & Plan Note (Signed)
Cath in 2020 with high grade disease in RCA. This has been managed medically. He is doing well w/o chest pain to suggest angina. Continue Atorvastatin, ASA 81 mg once daily.

## 2022-07-22 NOTE — Assessment & Plan Note (Signed)
BP controlled. Continue Lisinopril 5 mg once daily.

## 2022-07-22 NOTE — Assessment & Plan Note (Signed)
S/p TAVR in 02/2018. Echocardiogram in 03/2020 with mild paravalvular leak. He notes some chronic shortness of breath. This does not seem to be getting any worse. It has been 2 years since his last echocardiogram. Obtain follow up echocardiogram  Continue SBE prophylaxis Follow up 1 year.

## 2022-08-13 ENCOUNTER — Ambulatory Visit (HOSPITAL_COMMUNITY): Payer: PPO | Attending: Cardiovascular Disease

## 2022-08-13 DIAGNOSIS — I2510 Atherosclerotic heart disease of native coronary artery without angina pectoris: Secondary | ICD-10-CM | POA: Diagnosis not present

## 2022-08-13 DIAGNOSIS — I350 Nonrheumatic aortic (valve) stenosis: Secondary | ICD-10-CM | POA: Insufficient documentation

## 2022-08-13 LAB — ECHOCARDIOGRAM COMPLETE
AR max vel: 1.81 cm2
AV Area VTI: 1.73 cm2
AV Area mean vel: 1.61 cm2
AV Mean grad: 10 mmHg
AV Peak grad: 18.3 mmHg
Ao pk vel: 2.14 m/s
Area-P 1/2: 4.46 cm2
P 1/2 time: 340 msec
S' Lateral: 2.8 cm

## 2022-08-14 ENCOUNTER — Telehealth: Payer: Self-pay

## 2022-08-14 NOTE — Telephone Encounter (Signed)
Spoke with patient to review ECHO results, comments and recommendations from [place], [place]. Patient verbalized understanding and had no questions.

## 2022-08-14 NOTE — Telephone Encounter (Signed)
-----   Message from [facility] sent at 08/14/2022  7:36 AM EDT ----- EF normal. Mean aortic valve gradient slightly increased from last study. Paravalvular leak slightly worse (mild to mod now, previously was mild). Mod diastolic dysfunction. Mild mitral regurgitation. I will send a copy to Esperanza Richters, PA-C and Dr. Elease Hashimoto as [place] Childes. PLAN:  -Continue current therapy -Repeat echocardiogram in 1 year (s/p TAVR, aortic insufficiency) - I have placed order in Epic. Tereso Newcomer, PA-C    08/14/2022 7:19 AM

## 2022-08-14 NOTE — Addendum Note (Signed)
Addended byAlben Spittle, [place]in Picket T on: 08/14/2022 07:36 AM   Modules accepted: Orders

## 2022-09-24 ENCOUNTER — Other Ambulatory Visit: Payer: Self-pay | Admitting: *Deleted

## 2022-09-24 DIAGNOSIS — I6523 Occlusion and stenosis of bilateral carotid arteries: Secondary | ICD-10-CM

## 2022-09-28 ENCOUNTER — Other Ambulatory Visit: Payer: Self-pay | Admitting: Medical

## 2022-09-28 DIAGNOSIS — R0989 Other specified symptoms and signs involving the circulatory and respiratory systems: Secondary | ICD-10-CM

## 2022-09-28 DIAGNOSIS — I10 Essential (primary) hypertension: Secondary | ICD-10-CM

## 2022-09-28 DIAGNOSIS — E785 Hyperlipidemia, unspecified: Secondary | ICD-10-CM

## 2022-09-28 DIAGNOSIS — I779 Disorder of arteries and arterioles, unspecified: Secondary | ICD-10-CM

## 2022-09-28 DIAGNOSIS — I350 Nonrheumatic aortic (valve) stenosis: Secondary | ICD-10-CM

## 2022-09-30 ENCOUNTER — Encounter: Payer: Self-pay | Admitting: Vascular Surgery

## 2022-09-30 ENCOUNTER — Ambulatory Visit (HOSPITAL_COMMUNITY)
Admission: RE | Admit: 2022-09-30 | Discharge: 2022-09-30 | Disposition: A | Discharge status: Home / Self Care | Payer: PPO | Source: Ambulatory Visit | Attending: Vascular Surgery | Admitting: Vascular Surgery

## 2022-09-30 ENCOUNTER — Ambulatory Visit: Payer: PPO | Admitting: Vascular Surgery

## 2022-09-30 VITALS — BP 135/65 | HR 77 | Temp 98.0°F | Wt 210.0 lb

## 2022-09-30 DIAGNOSIS — I6523 Occlusion and stenosis of bilateral carotid arteries: Secondary | ICD-10-CM

## 2022-09-30 NOTE — Progress Notes (Signed)
Patient name: Jerry Ferguson MRN: [id number] DOB: [birth year] Sex: male  REASON FOR VISIT: 1 year follow-up for surveillance of carotid artery disease  HPI: Jerry Ferguson is a 77 y.o. male with multiple medical problems as listed below that presents for 1 year follow-up for surveillance of his carotid artery disease.  He had a right carotid endarterectomy on 09/19/2019 for an asymptomatic high-grade stenosis.  He reports no issues on follow-up today.  Remains on aspirin and statin.  No neurologic events since last evaluation.  No new concerns today.  Past Medical History:  Diagnosis Date   Abnormal liver function test    Acute bronchitis    Carotid artery occlusion    Bilateral Bruit   Cataract    Coronary artery disease involving native coronary artery of native heart without angina pectoris    DJD (degenerative joint disease)    Dyspnea    GERD (gastroesophageal reflux disease)    Heart murmur    History of UTI    Hypercholesteremia    Hypertension    Lumbar back pain    Overweight(278.02)    Pulmonary nodules    Multiple small pulmonary nodules scattered throughout both lungs, 12 month CT rec if pt high risk   S/P TAVR (transcatheter aortic valve replacement)    26 mm Edwards Sapien 3 transcatheter heart valve placed via percutaneous right transfemoral approach    Severe aortic stenosis    Severe aortic stenosis     Past Surgical History:  Procedure Laterality Date   APPENDECTOMY     CAROTID ENDARTERECTOMY Right 09/19/2019   COLONOSCOPY     CORONARY ANGIOGRAPHY N/A 02/04/2018   Procedure: CORONARY ANGIOGRAPHY;  Surgeon: Tonny Bollman, MD;  Location: [facility] INVASIVE CV LAB;  Service: Cardiovascular;  Laterality: N/A;   ENDARTERECTOMY Right 09/19/2019   Procedure: RIGHT ENDARTERECTOMY CAROTID;  Surgeon: Cephus Shelling, MD;  Location: [facility] OR;  Service: Vascular;  Laterality: Right;   PATCH ANGIOPLASTY Right 09/19/2019   Procedure: PATCH ANGIOPLASTY RIGHT CAROTID;   Surgeon: Cephus Shelling, MD;  Location: [facility] OR;  Service: Vascular;  Laterality: Right;   RIGHT HEART CATH N/A 02/04/2018   Procedure: RIGHT HEART CATH;  Surgeon: Tonny Bollman, MD;  Location: [facility] INVASIVE CV LAB;  Service: Cardiovascular;  Laterality: N/A;   TEE WITHOUT CARDIOVERSION N/A 02/23/2018   Procedure: TRANSESOPHAGEAL ECHOCARDIOGRAM (TEE);  Surgeon: Tonny Bollman, MD;  Location: [facility] INVASIVE CV LAB;  Service: Open Heart Surgery;  Laterality: N/A;   TRANSCATHETER AORTIC VALVE REPLACEMENT, TRANSFEMORAL  02/23/2018   TRANSCATHETER AORTIC VALVE REPLACEMENT, TRANSFEMORAL N/A 02/23/2018   Procedure: TRANSCATHETER AORTIC VALVE REPLACEMENT, TRANSFEMORAL;  Surgeon: Tonny Bollman, MD;  Location: [facility] INVASIVE CV LAB;  Service: Open Heart Surgery;  Laterality: N/A;    Family History  Problem Relation Age of Onset   Colon cancer Father        metastatic   Other Father        DJD   Arthritis Father 34   Healthy Mother        Living-87   Prostate cancer Brother    Heart attack Paternal Grandfather    Heart disease Paternal Grandfather    Cancer Paternal Uncle    Colon cancer Sister    Hypertension Daughter        x1   Healthy Daughter        x1   Esophageal cancer Neg Hx    Stomach cancer Neg Hx    Rectal cancer Neg Hx  SOCIAL HISTORY: Social History   Tobacco Use   Smoking status: Former    Current packs/day: 0.00    Average packs/day: 1 pack/day for 15.0 years (15.0 ttl pk-yrs)    Types: Cigarettes    Start date: 01/06/1974    Quit date: 01/06/1989    Years since quitting: 33.7   Smokeless tobacco: Never   Tobacco comments:    chewed some in early 20's  Substance Use Topics   Alcohol use: Not Currently    No Known Allergies  Current Outpatient Medications  Medication Sig Dispense Refill   aspirin EC 81 MG tablet Take 1 tablet (81 mg total) by mouth daily. Swallow whole. 90 tablet 3   atorvastatin (LIPITOR) 20 MG tablet TAKE 1 TABLET BY MOUTH EVERY DAY 90  tablet 1   atorvastatin (LIPITOR) 40 MG tablet Take 1 tablet (40 mg total) by mouth daily. 90 tablet 3   cyclobenzaprine (FLEXERIL) 5 MG tablet 1 tab po q hs prn neck pain 20 tablet 1   fluticasone (FLONASE) 50 MCG/ACT nasal spray Place 2 sprays into both nostrils as needed for allergies or rhinitis.     lisinopril (ZESTRIL) 5 MG tablet TAKE 1 TABLET (5 MG TOTAL) BY MOUTH DAILY. 90 tablet 1   No current facility-administered medications for this visit.    REVIEW OF SYSTEMS:  [X]  denotes positive finding, [ ]  denotes negative finding Cardiac  Comments:  Chest pain or chest pressure:    Shortness of breath upon exertion:    Short of breath when lying flat:    Irregular heart rhythm:        Vascular    Pain in calf, thigh, or hip brought on by ambulation:    Pain in feet at night that wakes you up from your sleep:     Blood clot in your veins:    Leg swelling:         Pulmonary    Oxygen at home:    Productive cough:     Wheezing:         Neurologic    Sudden weakness in arms or legs:     Sudden numbness in arms or legs:     Sudden onset of difficulty speaking or slurred speech:    Temporary loss of vision in one eye:     Problems with dizziness:         Gastrointestinal    Blood in stool:     Vomited blood:         Genitourinary    Burning when urinating:     Blood in urine:        Psychiatric    Major depression:         Hematologic    Bleeding problems:    Problems with blood clotting too easily:        Skin    Rashes or ulcers:        Constitutional    Fever or chills:      PHYSICAL EXAM: There were no vitals filed for this visit.   GENERAL: The patient is a well-nourished male, in no acute distress. The vital signs are documented above. CARDIAC: There is a regular rate and rhythm.  Right neck incision well-healed PULMONARY: No respiratory distress MUSCULOSKELETAL: There are no major deformities or cyanosis. NEUROLOGIC: No focal weakness or  paresthesias are detected.  Cranial nerves II through XII grossly intact SKIN: There are no ulcers or rashes noted. PSYCHIATRIC: The patient has a  normal affect.  DATA:   Carotid duplex today shows a patent right carotid endarterectomy site and minimal 1 to 39% stenosis on the left  Assessment/Plan:  77 year old male presents for 1 year follow-up for surveillance of his carotid artery disease.  He underwent right carotid endarterectomy on 09/19/2019 for an asymptomatic high-grade stenosis.  Discussed that his duplex today shows his right carotid endarterectomy site remains widely patent.  He has no significant contralateral disease on the left.  I have recommend he continue aspirin statin for risk reduction.  I will see him in 1 year with repeat carotid duplex for ongoing surveillance.   Cephus Shelling, MD [facility] Office: [phone]   Cephus Shelling

## 2022-10-10 ENCOUNTER — Other Ambulatory Visit: Payer: Self-pay

## 2022-10-10 DIAGNOSIS — I6523 Occlusion and stenosis of bilateral carotid arteries: Secondary | ICD-10-CM

## 2022-10-27 ENCOUNTER — Ambulatory Visit: Payer: PPO | Attending: Physician Assistant

## 2022-10-27 DIAGNOSIS — I2510 Atherosclerotic heart disease of native coronary artery without angina pectoris: Secondary | ICD-10-CM | POA: Diagnosis not present

## 2022-10-27 DIAGNOSIS — I350 Nonrheumatic aortic (valve) stenosis: Secondary | ICD-10-CM | POA: Diagnosis not present

## 2022-10-28 LAB — LIPID PANEL
Chol/HDL Ratio: 2.7 ratio (ref 0.0–5.0)
Cholesterol, Total: 104 mg/dL (ref 100–199)
HDL: 39 mg/dL — ABNORMAL LOW (ref >39)
LDL Chol Calc (NIH): 51 mg/dL (ref 0–99)
Triglycerides: 66 mg/dL (ref 0–149)
VLDL Cholesterol Cal: 14 mg/dL (ref 5–40)

## 2022-10-28 LAB — HEPATIC FUNCTION PANEL
ALT: 27 [IU]/L (ref 0–44)
AST: 30 [IU]/L (ref 0–40)
Albumin: 4.3 g/dL (ref 3.8–4.8)
Alkaline Phosphatase: 75 [IU]/L (ref 44–121)
Bilirubin Total: 1.6 mg/dL — ABNORMAL HIGH (ref 0.0–1.2)
Bilirubin, Direct: 0.33 mg/dL (ref 0.00–0.40)
Total Protein: 6.2 g/dL (ref 6.0–8.5)

## 2022-10-29 NOTE — Progress Notes (Signed)
Pt has been made aware of normal result and verbalized understanding.  jw

## 2023-01-24 ENCOUNTER — Other Ambulatory Visit: Payer: Self-pay | Admitting: Medical

## 2023-01-24 DIAGNOSIS — I10 Essential (primary) hypertension: Secondary | ICD-10-CM

## 2023-01-24 DIAGNOSIS — R0989 Other specified symptoms and signs involving the circulatory and respiratory systems: Secondary | ICD-10-CM

## 2023-01-24 DIAGNOSIS — I350 Nonrheumatic aortic (valve) stenosis: Secondary | ICD-10-CM

## 2023-01-24 DIAGNOSIS — I779 Disorder of arteries and arterioles, unspecified: Secondary | ICD-10-CM

## 2023-01-24 DIAGNOSIS — E785 Hyperlipidemia, unspecified: Secondary | ICD-10-CM

## 2023-07-09 ENCOUNTER — Encounter: Payer: Self-pay | Admitting: Medical

## 2023-07-17 DIAGNOSIS — H26493 Other secondary cataract, bilateral: Secondary | ICD-10-CM | POA: Diagnosis not present

## 2023-07-17 DIAGNOSIS — H02834 Dermatochalasis of left upper eyelid: Secondary | ICD-10-CM | POA: Diagnosis not present

## 2023-07-17 DIAGNOSIS — H02831 Dermatochalasis of right upper eyelid: Secondary | ICD-10-CM | POA: Diagnosis not present

## 2023-07-17 DIAGNOSIS — Z961 Presence of intraocular lens: Secondary | ICD-10-CM | POA: Diagnosis not present

## 2023-07-17 DIAGNOSIS — H526 Other disorders of refraction: Secondary | ICD-10-CM | POA: Diagnosis not present

## 2023-07-17 DIAGNOSIS — H43813 Vitreous degeneration, bilateral: Secondary | ICD-10-CM | POA: Diagnosis not present

## 2023-07-19 ENCOUNTER — Other Ambulatory Visit: Payer: Self-pay | Admitting: Physician Assistant

## 2023-08-03 ENCOUNTER — Ambulatory Visit (HOSPITAL_COMMUNITY)
Admission: RE | Admit: 2023-08-03 | Discharge: 2023-08-03 | Disposition: A | Discharge status: Home / Self Care | Source: Ambulatory Visit | Attending: Cardiology | Admitting: Cardiology

## 2023-08-03 ENCOUNTER — Encounter (HOSPITAL_COMMUNITY): Payer: Self-pay

## 2023-08-03 DIAGNOSIS — I351 Nonrheumatic aortic (valve) insufficiency: Secondary | ICD-10-CM | POA: Insufficient documentation

## 2023-08-03 DIAGNOSIS — Z952 Presence of prosthetic heart valve: Secondary | ICD-10-CM | POA: Diagnosis not present

## 2023-08-03 LAB — ECHOCARDIOGRAM COMPLETE
AR max vel: 1.32 cm2
AV Area VTI: 1.82 cm2
AV Area mean vel: 1.77 cm2
AV Mean grad: 7 mmHg
AV Peak grad: 20.6 mmHg
Ao pk vel: 2.27 m/s
Area-P 1/2: 2.84 cm2
MV M vel: 4.42 m/s
MV Peak grad: 78.1 mmHg
P 1/2 time: 635 ms
S' Lateral: 2.7 cm

## 2023-08-05 ENCOUNTER — Ambulatory Visit: Payer: Self-pay | Admitting: Physician Assistant

## 2023-08-05 DIAGNOSIS — I350 Nonrheumatic aortic (valve) stenosis: Secondary | ICD-10-CM

## 2023-08-13 ENCOUNTER — Other Ambulatory Visit: Payer: Self-pay | Admitting: Physician Assistant

## 2023-08-24 ENCOUNTER — Encounter: Payer: Self-pay | Admitting: *Deleted

## 2023-08-25 NOTE — Progress Notes (Unsigned)
 Cardiology Office Note   Date:  08/26/2023  ID:  Jerry Ferguson, Jerry Ferguson 05-10-1945, MRN [id number] PCP: Dorina Dallas RIGGERS  [facility] Providers Cardiologist:  Jerry Passe, MD (Inactive)   History of Present Illness Jerry Ferguson is a 78 y.o. male with past medical history significant of carotid stenosis s/p right CEA 09/2019, severe aortic stenosis s/p TAVR 02/2018, coronary artery disease, hypertension, hyperlipidemia, pulmonary nodules, SBO 2023, who presented for a routine follow-up.   Per chart review, patient was initially diagnosed with moderate aortic stenosis 2015.  He was lost to follow-up and referred back to cardiology 2019 and found to have progressive severe aortic stenosis and bilateral carotid stenosis.  He was referred to Dr. Wonda, pre-TAVR heart catheterization 02/04/2018 showed severe single-vessel CAD with diffuse stenosis of RCA and left-to-right collaterals supplying PDA and PLA branches, moderate ostial circumflex stenosis, nonobstructive LAD stenosis, normal right heart pressure.  He was recommended medical treatment for CAD.    He underwent successful TAVR with 26 mm Edwards SAPIEN 3 THV via TF approach on 02/23/2018.  Postop echocardiogram revealed LVEF 60%, normal functioning TAVR with mean gradient 9.5 mmHg and trivial PVL.  He did have some LBBB which was resolved prior to discharge.  Repeat echo 01/26/2019 (1 year post TAVR) showed LVEF 60%-65%, moderate LAE, mild MAC, mild TR, mild MR, mild AI, normal functioning TAVR with mean gradient 5 mmHg and mild PVL.  Mild PR.  Mild dilatation of aortic root 39 mm.  He did have some ongoing stable dyspnea on exertion post TAVR.  He was continued on baby aspirin  and SBE prophylaxis since 07/2019.   Preop stress Myoview  performed on 08/03/2019 (before R CEA) showed LVEF 55 to 65%, stress EF 64%, small defect of mild severity present in basal inferior and basal inferolateral location, defect is not reversible, likely  related to diaphragmatic attenuation, no ischemia noted, low risk study.  He underwent right carotid enterectomy with bovine patch angioplasty for asymptomatic high-grade carotid stenosis >80% on 09/19/2019 by vascular surgery.  He last followed up with Dr. Gretta 09/26/2022, doing well, remain on aspirin  and statin.  Doppler from 09/30/2022 showed 1- 39% stenosis of right proximal endarterectomy patch site, 1-39% stenosis of left ICA, bilateral vertebral artery antegrade flow, normal flow hemodynamics in bilateral subclavian arteries.   He was last seen by Glendia RIGGERS on 07/22/2022, continued to have some chronic DOE without changes, no angina.  Echo from 08/13/2022 showed s/p TAVR with mean gradient 10 mmHg, mild to moderate PVL, this was felt still within stable limits. He was continued on baby aspirin , atorvastatin , and lisinopril  5 mg for CAD, hypertension, and aortic stenosis.  Repeat echo last completed 08/03/2023 showed LVEF 60 to 65%, mild LVH, grade 1 DD, normal RV, mild LAE, mild MR, mild AI, status post TAVR with mean gradient 10 mmHg, no significant change comparing to 08/13/22.   Today he came in by himself, he states he still has dyspnea with exertion, but it only occurs with strenuous activity such as working on his car. He reports no problems with ADLs such as cook, shower, or clean the house. He denied any chest pain, syncope, orthopnea, PND, leg edema, or palpitation. He recalls 2-3 episodes of dizziness over the past year, that he almost passed out, but he could not recall the details of these episodes. He felt this maybe due to his neck problems. He had some ongoing neck pain and has a bad left shoulder, where he is going  to see his PCP for this Friday. He is complaint with his medication. He has quit smoking cigarettes.    ROS:  Constitutional: Denied fever, chills, malaise, night sweats Eyes: Denied vision change or loss Ears/Nose/Mouth/Throat: Denied ear ache, sore throat, coughing, sinus  pain Cardiovascular: see HPI  Respiratory: see HPI  Gastrointestinal: Denied nausea, vomiting, abdominal pain, diarrhea Genital/Urinary: Denied dysuria, hematuria, urinary frequency/urgency Musculoskeletal: neck and shoulder pain  Skin: Denied rash, wound Neuro: see HPI  Psych: Denied history of depression/anxiety  Endocrine: Denied history of diabetes   Studies Reviewed EKG Interpretation Date/Time:  Wednesday August 26 2023 13:41:13 EDT Ventricular Rate:  60 PR Interval:  162 QRS Duration:  106 QT Interval:  416 QTC Calculation: 416 R Axis:   14  Text Interpretation: Normal sinus rhythm Normal ECG When compared with ECG of 22-Jul-2022 10:14, No significant change was found Confirmed by Bates Collington [phone]) on 08/26/2023 2:27:56 PM    Echo from 08/03/23:  1. Compared to 08/13/22, no significant change; s/p TAVR with mean gradient  10 mmHg and mild AI.   2. Left ventricular ejection fraction, by estimation, is 60 to 65%. The  left ventricle has normal function. The left ventricle has no regional  wall motion abnormalities. There is mild left ventricular hypertrophy.  Left ventricular diastolic parameters  are consistent with Grade I diastolic dysfunction (impaired relaxation).   3. Right ventricular systolic function is normal. The right ventricular  size is normal.   4. Left atrial size was mildly dilated.   5. The mitral valve is normal in structure. Mild mitral valve  regurgitation. No evidence of mitral stenosis.   6. The aortic valve has been repaired/replaced. Aortic valve  regurgitation is mild. No aortic stenosis is present. There is a 26 mm  Sapien prosthetic (TAVR) valve present in the aortic position. Procedure  Date: 02/23/18.   7. The inferior vena cava is normal in size with greater than 50%  respiratory variability, suggesting right atrial pressure of 3 mmHg.    Carotid Doppler 09/30/2022:  Summary:  Right Carotid: The ECA appears occluded. Velocities in the  right proximal                 endarterectomy patch site are consistent with a 1-39%  stenosis.   Left Carotid: Velocities in the left ICA are consistent with an upper  range               1-39% stenosis.   Vertebrals:  Bilateral vertebral arteries demonstrate antegrade flow.  Subclavians: Normal flow hemodynamics were seen in bilateral subclavian               arteries.   Echocardiogram from 08/13/2022:   1. 26 mm S3. Vmax 2.1 m/s, MG 10 mmHG, EOA 1.73 cm2, DI 0.55. Mild to  moderate paravalulvar leak. MG has increased from 5 mmHG to 10 mmHG, but  still within limits for this valve. The aortic valve has been  repaired/replaced. Aortic valve regurgitation is   mild to moderate. There is a 26 mm Sapien prosthetic (TAVR) valve present  in the aortic position. Procedure Date: 02/23/2018.   2. Left ventricular ejection fraction, by estimation, is 55 to 60%. The  left ventricle has normal function. The left ventricle has no regional  wall motion abnormalities. Left ventricular diastolic parameters are  consistent with Grade II diastolic  dysfunction (pseudonormalization).   3. Right ventricular systolic function is normal. The right ventricular  size is normal. There is normal  pulmonary artery systolic pressure. The  estimated right ventricular systolic pressure is 21.0 mmHg.   4. The mitral valve is grossly normal. Mild mitral valve regurgitation.  No evidence of mitral stenosis.   5. The inferior vena cava is normal in size with greater than 50%  respiratory variability, suggesting right atrial pressure of 3 mmHg.   Stress Myoview  08/03/2019:  The left ventricular ejection fraction is normal (55-65%). Nuclear stress EF: 64%. There was no ST segment deviation noted during stress. There is a small defect of mild severity present in the basal inferior and basal inferolateral location. The defect is non-reversible. This is likely related to diaphragmatic attenuation artifact. No ischemia  noted. This is a low risk study.  Cardiac catheterization 02/04/2018:  Ost LM lesion is 30% stenosed. Dist LM lesion is 30% stenosed. Prox LAD lesion is 40% stenosed. Ost Cx to Prox Cx lesion is 50% stenosed. Mid RCA lesion is 80% stenosed. Dist RCA-1 lesion is 95% stenosed. Dist RCA-2 lesion is 80% stenosed. Ost RPDA to RPDA lesion is 80% stenosed.   1. Severe single vessel CAD with severe diffuse stenosis of the RCA and left-to-right collaterals supplying the PDA and PLA branches 2. Moderate ostial circumflex stenosis 3. Nonobstructive LAD stenosis 4. Normal right heart pressures   Recommend: continued multidisciplinary evaluation for treatment of severe aortic stenosis, CAD, and severe asymptomatic carotid stenosis    Risk Assessment/Calculations           Physical Exam VS:  BP 130/62   Pulse 60   Ht 5' 11 (1.803 m)   Wt 204 lb 12.8 oz (92.9 kg)   SpO2 97%   BMI 28.56 kg/m        Wt Readings from Last 3 Encounters:  08/26/23 204 lb 12.8 oz (92.9 kg)  09/30/22 210 lb (95.3 kg)  07/22/22 207 lb 9.6 oz (94.2 kg)    GEN: Well nourished, well developed in no acute distress NECK: No JVD CARDIAC: RRR, soft systolic murmur at RUSB RESPIRATORY:  Clear to auscultation without rales, wheezing or rhonchi  ABDOMEN: Soft, non-tender, non-distended EXTREMITIES:  No leg edema   ASSESSMENT AND PLAN  Dizziness - rare episode x3 over the past year, limited detail he can elaborate  - offered Zio monitor and he declined, he wants to watch for his symptoms and see his PCP for neck/shoulder pain first   History of severe aortic stenosis s/p TAVR 02/2018 - Repeat echo last on  08/03/2023 showed LVEF 60 to 65%, mild LVH, grade 1 DD, normal RV, mild LAE, mild MR, mild AI, s/p TAVR with mean gradient 10 mmHg, stable post TAVR change comparing to 08/2022 Echo  -He had chronic dyspnea on exertion post TAVR 2020, which has remained largely stable and only occurs with strenuous activity   -Will continue annual echo surveillance, next due 07/2024  -Continue baby aspirin  daily -Continue SBE prophylaxis with amoxicillin , he needs new script, has not seen dentist for 4 years, script sent today   Coronary artery disease - Cardiac cath before TAVR 2020 showed high-grade stenosis of RCA with collaterals and moderate stenosis of ostial circumflex, he was recommended medical management - Stress Myoview  before vascular surgery 07/2019 was low risk without ischemia - No angina or equivalent symptoms - Continue medical therapy with aspirin  81 mg daily and Lipitor 40 mg daily and lisinopril  5mg  daily   Hyperlipidemia - Last lipid profile from 10/27/2022 showed LDL 51, last LFT from 10/27/2022 WNL - LDL near goal, will repeat  fasting lipid profile and LFT this Friday after he sees his PCP  - Continue Lipitor 40 mg daily  Hypertension - Blood pressure is 130/62 today, does not check BP at home, discussed checking it one weekly at home and bring records in at next visitr, continue current therapy with lisinopril  5 mg daily, will check CMP for renal evaluation this Friday   Carotid artery stenosis status post right CEA 2021 -Continue follow-up with vascular surgery on aspirin  and statin      Dispo: Follow up with APP or new MD (Dr Alveta has retired) in 1 year, advised him to call if dizziness worsens or new cardiac symptoms    Signed, Elvira Langston, NP

## 2023-08-26 ENCOUNTER — Encounter: Payer: Self-pay | Admitting: Physician Assistant

## 2023-08-26 ENCOUNTER — Ambulatory Visit: Attending: Cardiology | Admitting: Home Health

## 2023-08-26 VITALS — BP 130/62 | HR 60 | Ht 71.0 in | Wt 204.8 lb

## 2023-08-26 DIAGNOSIS — I10 Essential (primary) hypertension: Secondary | ICD-10-CM

## 2023-08-26 DIAGNOSIS — E782 Mixed hyperlipidemia: Secondary | ICD-10-CM | POA: Diagnosis not present

## 2023-08-26 DIAGNOSIS — I2510 Atherosclerotic heart disease of native coronary artery without angina pectoris: Secondary | ICD-10-CM

## 2023-08-26 DIAGNOSIS — I350 Nonrheumatic aortic (valve) stenosis: Secondary | ICD-10-CM | POA: Diagnosis not present

## 2023-08-26 DIAGNOSIS — Z952 Presence of prosthetic heart valve: Secondary | ICD-10-CM

## 2023-08-26 MED ORDER — AMOXICILLIN 500 MG PO TABS: ORAL_TABLET | ORAL | 1 refills | Status: AC

## 2023-08-26 NOTE — Patient Instructions (Addendum)
 Medication Instructions:  Your physician recommends that you continue on your current medications as directed. Please refer to the Current Medication list given to you today. We have renewed your Antibiotics prior to any dental work.  It has been sent to [facility]. *If you need a refill on your cardiac medications before your next appointment, please call your pharmacy*  Lab Work: COME FASTING FRIDAY FOR: LIPID & CMET   If you have labs (blood work) drawn today and your tests are completely normal, you will receive your results only by: MyChart Message (if you have MyChart) OR A paper copy in the mail If you have any lab test that is abnormal or we need to change your treatment, we will call you to review the results.  Testing/Procedures: None ordered  Follow-Up: At [facility], you and your health needs are our priority.  As part of our continuing mission to provide you with exceptional heart care, our providers are all part of one team.  This team includes your primary Cardiologist (physician) and Advanced Practice Providers or APPs (Physician Assistants and Nurse Practitioners) who all work together to provide you with the care you need, when you need it.  Your next appointment:   12 month(s)  Provider:   Glendia Ferrier, PA-C          We recommend signing up for the patient portal called MyChart.  Sign up information is provided on this After Visit Summary.  MyChart is used to connect with patients for Virtual Visits (Telemedicine).  Patients are able to view lab/test results, encounter notes, upcoming appointments, etc.  Non-urgent messages can be sent to your provider as well.   To learn more about what you can do with MyChart, go to [email or web address].   Other Instructions

## 2023-08-28 ENCOUNTER — Ambulatory Visit (HOSPITAL_BASED_OUTPATIENT_CLINIC_OR_DEPARTMENT_OTHER)
Admission: RE | Admit: 2023-08-28 | Discharge: 2023-08-28 | Disposition: A | Discharge status: Home / Self Care | Source: Ambulatory Visit | Attending: Medical | Admitting: Medical

## 2023-08-28 ENCOUNTER — Ambulatory Visit: Admitting: Medical

## 2023-08-28 ENCOUNTER — Ambulatory Visit: Payer: Self-pay | Admitting: Medical

## 2023-08-28 ENCOUNTER — Encounter: Payer: Self-pay | Admitting: Medical

## 2023-08-28 VITALS — BP 122/60 | HR 61 | Temp 98.0°F | Resp 17 | Ht 71.0 in | Wt 198.6 lb

## 2023-08-28 DIAGNOSIS — E875 Hyperkalemia: Secondary | ICD-10-CM | POA: Diagnosis not present

## 2023-08-28 DIAGNOSIS — S46811A Strain of other muscles, fascia and tendons at shoulder and upper arm level, right arm, initial encounter: Secondary | ICD-10-CM

## 2023-08-28 DIAGNOSIS — R739 Hyperglycemia, unspecified: Secondary | ICD-10-CM

## 2023-08-28 DIAGNOSIS — M542 Cervicalgia: Secondary | ICD-10-CM

## 2023-08-28 DIAGNOSIS — R17 Unspecified jaundice: Secondary | ICD-10-CM

## 2023-08-28 DIAGNOSIS — M4802 Spinal stenosis, cervical region: Secondary | ICD-10-CM | POA: Diagnosis not present

## 2023-08-28 DIAGNOSIS — E785 Hyperlipidemia, unspecified: Secondary | ICD-10-CM | POA: Diagnosis not present

## 2023-08-28 DIAGNOSIS — I6521 Occlusion and stenosis of right carotid artery: Secondary | ICD-10-CM | POA: Diagnosis not present

## 2023-08-28 DIAGNOSIS — I10 Essential (primary) hypertension: Secondary | ICD-10-CM

## 2023-08-28 DIAGNOSIS — R42 Dizziness and giddiness: Secondary | ICD-10-CM | POA: Diagnosis not present

## 2023-08-28 DIAGNOSIS — Z1159 Encounter for screening for other viral diseases: Secondary | ICD-10-CM | POA: Diagnosis not present

## 2023-08-28 DIAGNOSIS — M50322 Other cervical disc degeneration at C5-C6 level: Secondary | ICD-10-CM | POA: Diagnosis not present

## 2023-08-28 MED ORDER — CYCLOBENZAPRINE HCL 5 MG PO TABS: ORAL_TABLET | ORAL | 0 refills | Status: DC

## 2023-08-28 NOTE — Patient Instructions (Addendum)
 History of severe aortic stenosis s/p TAVR 02/2018 - Repeat echo last on  08/03/2023 showed LVEF 60 to 65%, mild LVH, grade 1 DD, normal RV, mild LAE, mild MR, mild AI, s/p TAVR with mean gradient 10 mmHg, stable post TAVR change comparing to 08/2022 Echo  -He had chronic dyspnea on exertion post TAVR 2020, which has remained largely stable and only occurs with strenuous activity  -Will continue annual echo surveillance, next due 07/2024  -Continue baby aspirin  daily -Continue SBE prophylaxis with amoxicillin , he needs new script, has not seen dentist for 4 years, script sent today    Coronary artery disease - Cardiac cath before TAVR 2020 showed high-grade stenosis of RCA with collaterals and moderate stenosis of ostial circumflex, he was recommended medical management - Stress Myoview  before vascular surgery 07/2019 was low risk without ischemia - No angina or equivalent symptoms - Continue medical therapy with aspirin  81 mg daily and Lipitor 40 mg daily and lisinopril  5mg  daily    Hyperlipidemia - Last lipid profile from 10/27/2022 showed LDL 51, last LFT from 10/27/2022 WNL - LDL near goal, will repeat fasting lipid profile and LFT this Friday after he sees his PCP  - Continue Lipitor 40 mg daily   Hypertension - Blood pressure is 130/62 today, does not check BP at home, discussed checking it one weekly at home and bring records in at next visitr, continue current therapy with lisinopril  5 mg daily, will check CMP for renal evaluation this Friday    Carotid artery stenosis status post right CEA 2021 -Continue follow-up with vascular surgery on aspirin  and statin Carotid artery stenosis monitored with scheduled carotid ultrasound. No recent complications.  Chronic neck and trapezius pain Chronic right-sided neck and trapezius pain with muscle tightness, exacerbated by activities. No radicular symptoms. Flexeril  used for muscle relaxation with caution due to sedation risk. - Prescribe  Flexeril  5 mg, 15 tablets, to be taken at night as needed. - Advise Flexeril  use only on bad days with 8 hours of sleep to avoid sedation and falls. - Order neck X-ray for degenerative changes. -can use low dose Tylenol  if needed.  Carotid artery stenosis Carotid artery stenosis monitored with scheduled carotid ultrasound. No recent complications.  Episodes of near syncope and dizziness Intermittent episodes of near syncope and dizziness, brief in duration, occurring while sitting. Differential includes cardiac, neurological, and metabolic causes. Cardiologist recommended Zio patch for cardiac monitoring.(pt declined) 2 days ago. - Perform metabolic panel for electrolyte imbalances. - Perform lipid panel and A1c test for metabolic causes. - Advise immediate evaluation through ED if syncopal episode ever occurs. - Schedule follow-up in 3 months.  General Health Maintenance Vaccinations and screenings discussed. He has not received Shingrix, Tdap, flu, or COVID vaccines. Declined colonoscopy. Agreed to hepatitis C screening. - Recommend Shingrix, Tdap, flu, and COVID vaccines. Can get vaccine thur pharmacy. Flu vaccine option with our offic in septembe. - Perform hepatitis C antibody test.  Follow-Up Follow-up plans for ongoing management and monitoring discussed. - Schedule follow-up appointment in 3 months. - Ensure carotid ultrasound completion on October 7th.

## 2023-08-28 NOTE — Progress Notes (Signed)
 Subjective:    Patient ID: Jerry Ferguson, male    DOB: November 28, 1945, 78 y.o.   MRN: [id number]  HPI Jerry Ferguson is a 78 year old male with chronic neck and trapezius pain who presents for evaluation of his symptoms and medication management.  He experiences chronic neck pain, particularly in the trapezius muscle, which is persistent and described as tight and painful. He uses Flexeril  as needed, primarily at night to help relax the muscles, but has been out of the medication for a while. He inquires about a prescription for Flexeril , indicating he uses it sparingly, possibly lasting a year with a 15-tablet prescription of 5 mg each.  He has a history of a left rotator cuff injury from high school football, which was never surgically repaired, resulting in limited range of motion in the left arm. He cannot raise the left arm, but there is no pain radiating from the neck to the arms. The right arm is generally fine, though he has experienced muscle pulls.  He mentions episodes of dizziness or near syncope occurring a few times over the past year, with the last episode about two months ago. These episodes typically happen while sitting on the couch and are characterized by a dimming of vision rather than a complete blackout. No headaches or muscle soreness are associated with these episodes. He lives alone, so no one has witnessed these events.  He has a history of cardiovascular issues, including carotid stenosis, and is scheduled for a carotid ultrasound on October 7th. He had an EKG a few weeks ago and has had elevated blood sugar in the past. He has not had any recent cardiac problems and follows up yearly for his cardiac issues.  He quit smoking and is unsure if he received the Shingrix vaccine last year. He has not had a colonoscopy since 2015, which was normal except for small hemorrhoids, and he declined a repeat procedure.   Review of Systems  Constitutional:  Negative for chills,  fatigue and fever.  HENT:  Negative for congestion and ear pain.   Respiratory:  Negative for chest tightness and wheezing.   Cardiovascular:  Negative for chest pain and palpitations.  Gastrointestinal:  Negative for abdominal pain, constipation, diarrhea, nausea and vomiting.  Genitourinary:  Negative for dysuria and frequency.  Musculoskeletal:  Negative for back pain.  Skin:  Negative for rash.  Neurological:  Negative for dizziness, syncope and weakness.       See hpi. No dizziness presently.  Hematological:  Negative for adenopathy. Does not bruise/bleed easily.  Psychiatric/Behavioral:  Negative for behavioral problems, decreased concentration and suicidal ideas. The patient is not nervous/anxious.     Past Medical History:  Diagnosis Date   Abnormal liver function test    Acute bronchitis    Carotid artery occlusion    Bilateral Bruit   Cataract    Coronary artery disease involving native coronary artery of native heart without angina pectoris    DJD (degenerative joint disease)    Dyspnea    GERD (gastroesophageal reflux disease)    Heart murmur    History of UTI    Hypercholesteremia    Hypertension    Lumbar back pain    Overweight(278.02)    Pulmonary nodules    Multiple small pulmonary nodules scattered throughout both lungs, 12 month CT rec if pt high risk   S/P TAVR (transcatheter aortic valve replacement)    26 mm Edwards Sapien 3 transcatheter heart valve placed via  percutaneous right transfemoral approach    Severe aortic stenosis    Severe aortic stenosis      Social History   Socioeconomic History   Marital status: Widowed    Spouse name: stacey(deceased 09/16/2009)   Number of children: 1   Years of education: Not on file   Highest education level: Not on file  Occupational History   Occupation: welder  Tobacco Use   Smoking status: Former    Current packs/day: 0.00    Average packs/day: 1 pack/day for 15.0 years (15.0 ttl pk-yrs)    Types:  Cigarettes    Start date: 01/06/1974    Quit date: 01/06/1989    Years since quitting: 34.6   Smokeless tobacco: Never   Tobacco comments:    chewed some in early 09-17-2023  Vaping Use   Vaping status: Never Used  Substance and Sexual Activity   Alcohol use: Not Currently   Drug use: No   Sexual activity: Not on file  Other Topics Concern   Not on file  Social History Narrative   Uncle with prostate cancer?   Social Drivers of Corporate investment banker Strain: Low Risk  (04/08/2021)   Overall Financial Resource Strain (CARDIA)    Difficulty of Paying Living Expenses: Not hard at all  Food Insecurity: Not on file  Transportation Needs: Not on file  Physical Activity: Not on file  Stress: Not on file  Social Connections: Unknown (05/19/2021)   [facility]   Social Network    Social Network: Not on file  Intimate Partner Violence: Unknown (04/10/2021)   Received from [facility]   HITS    Physically Hurt: Not on file    Insult or Talk Down To: Not on file    Threaten Physical Harm: Not on file    Scream or Curse: Not on file    Past Surgical History:  Procedure Laterality Date   APPENDECTOMY     CAROTID ENDARTERECTOMY Right 09/19/2019   COLONOSCOPY     CORONARY ANGIOGRAPHY N/A 02/04/2018   Procedure: CORONARY ANGIOGRAPHY;  Surgeon: Wonda Sharper, MD;  Location: [facility] INVASIVE CV LAB;  Service: Cardiovascular;  Laterality: N/A;   ENDARTERECTOMY Right 09/19/2019   Procedure: RIGHT ENDARTERECTOMY CAROTID;  Surgeon: Gretta Lonni PARAS, MD;  Location: [facility] OR;  Service: Vascular;  Laterality: Right;   PATCH ANGIOPLASTY Right 09/19/2019   Procedure: PATCH ANGIOPLASTY RIGHT CAROTID;  Surgeon: Gretta Lonni PARAS, MD;  Location: [facility] OR;  Service: Vascular;  Laterality: Right;   RIGHT HEART CATH N/A 02/04/2018   Procedure: RIGHT HEART CATH;  Surgeon: Wonda Sharper, MD;  Location: [facility] INVASIVE CV LAB;  Service: Cardiovascular;  Laterality: N/A;   TEE WITHOUT CARDIOVERSION  N/A 02/23/2018   Procedure: TRANSESOPHAGEAL ECHOCARDIOGRAM (TEE);  Surgeon: Wonda Sharper, MD;  Location: [facility] INVASIVE CV LAB;  Service: Open Heart Surgery;  Laterality: N/A;   TRANSCATHETER AORTIC VALVE REPLACEMENT, TRANSFEMORAL  02/23/2018   TRANSCATHETER AORTIC VALVE REPLACEMENT, TRANSFEMORAL N/A 02/23/2018   Procedure: TRANSCATHETER AORTIC VALVE REPLACEMENT, TRANSFEMORAL;  Surgeon: Wonda Sharper, MD;  Location: [facility] INVASIVE CV LAB;  Service: Open Heart Surgery;  Laterality: N/A;    Family History  Problem Relation Age of Onset   Colon cancer Father        metastatic   Other Father        DJD   Arthritis Father 58   Healthy Mother        Living-87   Prostate cancer Brother    Heart  attack Paternal Grandfather    Heart disease Paternal Grandfather    Cancer Paternal Uncle    Colon cancer Sister    Hypertension Daughter        x1   Healthy Daughter        x1   Esophageal cancer Neg Hx    Stomach cancer Neg Hx    Rectal cancer Neg Hx     No Known Allergies  Current Outpatient Medications on File Prior to Visit  Medication Sig Dispense Refill   amoxicillin  (AMOXIL ) 500 MG tablet TAKE 4 TABLETS BY MOUTH 1 HOUR PRIOR TO ANY DENTAL WORK 12 tablet 1   aspirin  EC 81 MG tablet Take 1 tablet (81 mg total) by mouth daily. Swallow whole. 90 tablet 3   atorvastatin  (LIPITOR) 40 MG tablet TAKE 1 TABLET BY MOUTH EVERY DAY 30 tablet 0   fluticasone  (FLONASE ) 50 MCG/ACT nasal spray Place 2 sprays into both nostrils as needed for allergies or rhinitis.     lisinopril  (ZESTRIL ) 5 MG tablet TAKE 1 TABLET (5 MG TOTAL) BY MOUTH DAILY. 90 tablet 1   No current facility-administered medications on file prior to visit.    BP 122/60   Pulse 61   Temp 98 F (36.7 C) (Oral)   Resp 17   Ht 5' 11 (1.803 m)   Wt 198 lb 9.6 oz (90.1 kg)   SpO2 98%   BMI 27.70 kg/m        Objective:   Physical Exam  General Mental Status- Alert. General Appearance- Not in acute distress.    Skin General: Color- Normal Color. Moisture- Normal Moisture.  Neck Carotid Arteries- Normal color. Moisture- Normal Moisture. No carotid bruits. No JVD.  Chest and Lung Exam Auscultation: Breath Sounds:-Normal.  Cardiovascular Auscultation:Rythm- Regular. Murmurs & Other Heart Sounds:Auscultation of the heart reveals- No Murmurs.  Abdomen Inspection:-Inspeection Normal. Palpation/Percussion:Note:No mass. Palpation and Percussion of the abdomen reveal- Non Tender, Non Distended + BS, no rebound or guarding.    Neurologic Cranial Nerve exam:- CN III-XII intact(No nystagmus), symmetric smile. Strength:- 5/5 equal and symmetric strength both upper and lower extremities.    Lower ext- no pedal edema, negative homans signs. Symmetric calfs.      Assessment & Plan:   Patient Instructions  History of severe aortic stenosis s/p TAVR 02/2018 - Repeat echo last on  08/03/2023 showed LVEF 60 to 65%, mild LVH, grade 1 DD, normal RV, mild LAE, mild MR, mild AI, s/p TAVR with mean gradient 10 mmHg, stable post TAVR change comparing to 08/2022 Echo  -He had chronic dyspnea on exertion post TAVR 2020, which has remained largely stable and only occurs with strenuous activity  -Will continue annual echo surveillance, next due 07/2024  -Continue baby aspirin  daily -Continue SBE prophylaxis with amoxicillin , he needs new script, has not seen dentist for 4 years, script sent today    Coronary artery disease - Cardiac cath before TAVR 2020 showed high-grade stenosis of RCA with collaterals and moderate stenosis of ostial circumflex, he was recommended medical management - Stress Myoview  before vascular surgery 07/2019 was low risk without ischemia - No angina or equivalent symptoms - Continue medical therapy with aspirin  81 mg daily and Lipitor 40 mg daily and lisinopril  5mg  daily    Hyperlipidemia - Last lipid profile from 10/27/2022 showed LDL 51, last LFT from 10/27/2022 WNL - LDL near  goal, will repeat fasting lipid profile and LFT this Friday after he sees his PCP  - Continue Lipitor 40  mg daily   Hypertension - Blood pressure is 130/62 today, does not check BP at home, discussed checking it one weekly at home and bring records in at next visitr, continue current therapy with lisinopril  5 mg daily, will check CMP for renal evaluation this Friday    Carotid artery stenosis status post right CEA 2021 -Continue follow-up with vascular surgery on aspirin  and statin Carotid artery stenosis monitored with scheduled carotid ultrasound. No recent complications.  Chronic neck and trapezius pain Chronic right-sided neck and trapezius pain with muscle tightness, exacerbated by activities. No radicular symptoms. Flexeril  used for muscle relaxation with caution due to sedation risk. - Prescribe Flexeril  5 mg, 15 tablets, to be taken at night as needed. - Advise Flexeril  use only on bad days with 8 hours of sleep to avoid sedation and falls. - Order neck X-ray for degenerative changes. -can use low dose Tylenol  if needed.  Carotid artery stenosis Carotid artery stenosis monitored with scheduled carotid ultrasound. No recent complications.  Episodes of near syncope and dizziness Intermittent episodes of near syncope and dizziness, brief in duration, occurring while sitting. Differential includes cardiac, neurological, and metabolic causes. Cardiologist recommended Zio patch for cardiac monitoring.(pt declined) 2 days ago. - Perform metabolic panel for electrolyte imbalances. - Perform lipid panel and A1c test for metabolic causes. - Advise immediate evaluation through ED if syncopal episode ever occurs. - Schedule follow-up in 3 months.  General Health Maintenance Vaccinations and screenings discussed. He has not received Shingrix, Tdap, flu, or COVID vaccines. Declined colonoscopy. Agreed to hepatitis C screening. - Recommend Shingrix, Tdap, flu, and COVID vaccines. Can get  vaccine thur pharmacy. Flu vaccine option with our offic in septembe. - Perform hepatitis C antibody test.  Follow-Up Follow-up plans for ongoing management and monitoring discussed. - Schedule follow-up appointment in 3 months. - Ensure carotid ultrasound completion on October 7th.    Mikka Kissner, PA-C

## 2023-08-29 LAB — COMPREHENSIVE METABOLIC PANEL WITH GFR
AG Ratio: 2.2 (calc) (ref 1.0–2.5)
ALT: 18 U/L (ref 9–46)
AST: 18 U/L (ref 10–35)
Albumin: 4.8 g/dL (ref 3.6–5.1)
Alkaline phosphatase (APISO): 71 U/L (ref 35–144)
BUN: 20 mg/dL (ref 7–25)
CO2: 28 mmol/L (ref 20–32)
Calcium: 9.8 mg/dL (ref 8.6–10.3)
Chloride: 105 mmol/L (ref 98–110)
Creat: 1.14 mg/dL (ref 0.70–1.28)
Globulin: 2.2 g/dL (ref 1.9–3.7)
Glucose, Bld: 92 mg/dL (ref 65–99)
Potassium: 5.4 mmol/L — ABNORMAL HIGH (ref 3.5–5.3)
Sodium: 141 mmol/L (ref 135–146)
Total Bilirubin: 1.4 mg/dL — ABNORMAL HIGH (ref 0.2–1.2)
Total Protein: 7 g/dL (ref 6.1–8.1)
eGFR: 66 mL/min/1.73m2 (ref >=60)

## 2023-08-29 LAB — LIPID PANEL
Cholesterol: 127 mg/dL (ref <200)
HDL: 53 mg/dL (ref >=40)
LDL Cholesterol (Calc): 57 mg/dL
Non-HDL Cholesterol (Calc): 74 mg/dL (ref <130)
Total CHOL/HDL Ratio: 2.4 (calc) (ref <5.0)
Triglycerides: 91 mg/dL (ref <150)

## 2023-08-29 LAB — HEMOGLOBIN A1C
Hgb A1c MFr Bld: 5.6 % (ref <5.7)
Mean Plasma Glucose: 114 mg/dL
eAG (mmol/L): 6.3 mmol/L

## 2023-08-29 LAB — HEPATITIS C ANTIBODY: Hepatitis C Ab: NONREACTIVE

## 2023-09-08 ENCOUNTER — Telehealth: Payer: Self-pay | Admitting: Medical

## 2023-09-08 NOTE — Telephone Encounter (Signed)
 Copied from CRM [id number]. Topic: Medicare AWV >> Sep 08, 2023  9:44 AM Nathanel DEL wrote: Reason for CRM: Called LVM 09/08/2023 to schedule AWV. Please schedule office or virtual visits.  Nathanel Paschal; Care Guide Ambulatory Clinical Support [place] [facility] Direct Dial: [phone]

## 2023-09-11 ENCOUNTER — Other Ambulatory Visit: Payer: Self-pay | Admitting: Vascular Surgery

## 2023-09-11 DIAGNOSIS — I6523 Occlusion and stenosis of bilateral carotid arteries: Secondary | ICD-10-CM

## 2023-09-12 ENCOUNTER — Other Ambulatory Visit: Payer: Self-pay | Admitting: Physician Assistant

## 2023-09-18 ENCOUNTER — Telehealth: Payer: Self-pay

## 2023-09-18 ENCOUNTER — Other Ambulatory Visit: Payer: Self-pay

## 2023-09-18 ENCOUNTER — Other Ambulatory Visit: Payer: Self-pay | Admitting: Medical

## 2023-09-18 DIAGNOSIS — I350 Nonrheumatic aortic (valve) stenosis: Secondary | ICD-10-CM

## 2023-09-18 DIAGNOSIS — I10 Essential (primary) hypertension: Secondary | ICD-10-CM

## 2023-09-18 DIAGNOSIS — I779 Disorder of arteries and arterioles, unspecified: Secondary | ICD-10-CM

## 2023-09-18 DIAGNOSIS — R0989 Other specified symptoms and signs involving the circulatory and respiratory systems: Secondary | ICD-10-CM

## 2023-09-18 DIAGNOSIS — E785 Hyperlipidemia, unspecified: Secondary | ICD-10-CM

## 2023-09-18 MED ORDER — LISINOPRIL 5 MG PO TABS: 5.0000 mg | ORAL_TABLET | Freq: Every day | ORAL | 1 refills | Status: AC

## 2023-09-18 NOTE — Telephone Encounter (Signed)
 Copied from CRM #[id number]. Topic: Clinical - Lab/Test Results >> Sep 18, 2023 12:39 PM Chasity T wrote: Reason for CRM: Patient is calling to request the recent results to his labs and imagining be mailed to him.

## 2023-09-18 NOTE — Telephone Encounter (Signed)
 Forms mailed   Copied from CRM [id number]. Topic: Clinical - Lab/Test Results >> Sep 18, 2023 12:39 PM Chasity T wrote: Reason for CRM: Patient is calling to request the recent results to his labs and imagining be mailed to him.

## 2023-09-25 ENCOUNTER — Other Ambulatory Visit: Payer: Self-pay | Admitting: Medical

## 2023-09-30 NOTE — Progress Notes (Signed)
 Jerry Ferguson                                          MRN: [id number]   09/30/2023   The [facility] Quality Team Specialist reviewed this patient medical record for the purposes of chart review for care gap closure. The following were reviewed: abstraction for care gap closure-controlling blood pressure.    [facility] Quality Team

## 2023-10-13 ENCOUNTER — Ambulatory Visit: Admitting: Vascular Surgery

## 2023-10-13 ENCOUNTER — Ambulatory Visit (HOSPITAL_COMMUNITY)

## 2023-11-10 ENCOUNTER — Other Ambulatory Visit: Payer: Self-pay | Admitting: Medical

## 2023-11-11 NOTE — Telephone Encounter (Signed)
 Pt request refill of flexeril . He had neck pain in August. Gave 5 tab refill. But ask him to follow up within next 2-3 weeks. Was not planning to continue to rx flexeril  chronically was hoping neck pain would resolve completely. So wan appointment to discuss.

## 2023-11-12 NOTE — Telephone Encounter (Signed)
 Called pt per pcp to discuss the need for flexeril  and to schedule an appointment but received no answer. No vm was left due to vm not being set up. Will try again later
